# Patient Record
Sex: Male | Born: 1940
Health system: Southern US, Community
[De-identification: ages and names within clinical notes are randomized; demographics above are authoritative.]

## PROBLEM LIST (undated history)

## (undated) DIAGNOSIS — E049 Nontoxic goiter, unspecified: Secondary | ICD-10-CM

## (undated) DIAGNOSIS — I428 Other cardiomyopathies: Secondary | ICD-10-CM

## (undated) DIAGNOSIS — M1990 Unspecified osteoarthritis, unspecified site: Secondary | ICD-10-CM

## (undated) DIAGNOSIS — I5040 Unspecified combined systolic (congestive) and diastolic (congestive) heart failure: Secondary | ICD-10-CM

## (undated) DIAGNOSIS — I5042 Chronic combined systolic (congestive) and diastolic (congestive) heart failure: Secondary | ICD-10-CM

## (undated) DIAGNOSIS — N529 Male erectile dysfunction, unspecified: Secondary | ICD-10-CM

## (undated) DIAGNOSIS — C801 Malignant (primary) neoplasm, unspecified: Secondary | ICD-10-CM

## (undated) DIAGNOSIS — I340 Nonrheumatic mitral (valve) insufficiency: Secondary | ICD-10-CM

## (undated) DIAGNOSIS — I441 Atrioventricular block, second degree: Secondary | ICD-10-CM

## (undated) DIAGNOSIS — I10 Essential (primary) hypertension: Secondary | ICD-10-CM

## (undated) DIAGNOSIS — I4892 Unspecified atrial flutter: Principal | ICD-10-CM

## (undated) DIAGNOSIS — N183 Chronic kidney disease, stage 3 unspecified: Secondary | ICD-10-CM

## (undated) DIAGNOSIS — D649 Anemia, unspecified: Secondary | ICD-10-CM

## (undated) DIAGNOSIS — G510 Bell's palsy: Secondary | ICD-10-CM

## (undated) HISTORY — PX: OTHER SURGICAL HISTORY: SHX169

## (undated) HISTORY — DX: Male erectile dysfunction, unspecified: N52.9

## (undated) HISTORY — PX: CYST REMOVAL TRUNK: SHX6283

## (undated) HISTORY — DX: Chronic combined systolic (congestive) and diastolic (congestive) heart failure: I50.42

## (undated) HISTORY — DX: Other cardiomyopathies: I42.8

## (undated) HISTORY — DX: Atrioventricular block, second degree: I44.1

## (undated) HISTORY — DX: Chronic kidney disease, stage 3 unspecified: N18.30

## (undated) HISTORY — DX: Chronic kidney disease, stage 3 (moderate): N18.3

## (undated) HISTORY — DX: Bell's palsy: G51.0

---

## 2008-09-22 ENCOUNTER — Encounter: Admission: RE | Admit: 2008-09-22 | Discharge: 2008-09-22 | Payer: Self-pay | Admitting: Family Medicine

## 2011-06-26 DIAGNOSIS — G510 Bell's palsy: Secondary | ICD-10-CM

## 2011-06-26 HISTORY — DX: Bell's palsy: G51.0

## 2011-10-01 ENCOUNTER — Other Ambulatory Visit: Payer: Self-pay

## 2011-10-01 ENCOUNTER — Emergency Department (HOSPITAL_COMMUNITY)
Admission: EM | Admit: 2011-10-01 | Discharge: 2011-10-01 | Disposition: A | Discharge status: Home / Self Care | Payer: Medicare Other | Attending: Emergency Medicine | Admitting: Emergency Medicine

## 2011-10-01 ENCOUNTER — Emergency Department (HOSPITAL_COMMUNITY): Payer: Medicare Other

## 2011-10-01 ENCOUNTER — Encounter (HOSPITAL_COMMUNITY): Payer: Self-pay | Admitting: *Deleted

## 2011-10-01 DIAGNOSIS — H00039 Abscess of eyelid unspecified eye, unspecified eyelid: Secondary | ICD-10-CM | POA: Insufficient documentation

## 2011-10-01 DIAGNOSIS — I10 Essential (primary) hypertension: Secondary | ICD-10-CM | POA: Insufficient documentation

## 2011-10-01 DIAGNOSIS — I441 Atrioventricular block, second degree: Secondary | ICD-10-CM | POA: Insufficient documentation

## 2011-10-01 DIAGNOSIS — M47812 Spondylosis without myelopathy or radiculopathy, cervical region: Secondary | ICD-10-CM | POA: Insufficient documentation

## 2011-10-01 DIAGNOSIS — R51 Headache: Secondary | ICD-10-CM | POA: Insufficient documentation

## 2011-10-01 DIAGNOSIS — L03213 Periorbital cellulitis: Secondary | ICD-10-CM

## 2011-10-01 HISTORY — DX: Essential (primary) hypertension: I10

## 2011-10-01 LAB — BASIC METABOLIC PANEL
CO2: 26 mEq/L (ref 19–32)
Calcium: 9.4 mg/dL (ref 8.4–10.5)
Creatinine, Ser: 1.21 mg/dL (ref 0.50–1.35)
GFR calc non Af Amer: 60 mL/min — ABNORMAL LOW (ref >90)
Sodium: 136 mEq/L (ref 135–145)

## 2011-10-01 LAB — CBC
HCT: 35.9 % — ABNORMAL LOW (ref 39.0–52.0)
MCH: 31.8 pg (ref 26.0–34.0)
MCHC: 34.5 g/dL (ref 30.0–36.0)
MCV: 92.1 fL (ref 78.0–100.0)
Platelets: 244 10*3/uL (ref 150–400)
RDW: 13.9 % (ref 11.5–15.5)

## 2011-10-01 LAB — DIFFERENTIAL
Basophils Absolute: 0 10*3/uL (ref 0.0–0.1)
Eosinophils Absolute: 0 10*3/uL (ref 0.0–0.7)
Eosinophils Relative: 1 % (ref 0–5)
Monocytes Absolute: 0.6 10*3/uL (ref 0.1–1.0)

## 2011-10-01 MED ORDER — TETRACAINE HCL 0.5 % OP SOLN
2.0000 [drp] | Freq: Once | OPHTHALMIC | Status: AC
  Administered 2011-10-01: 2 [drp] via OPHTHALMIC
  Filled 2011-10-01: qty 2

## 2011-10-01 MED ORDER — FLUORESCEIN SODIUM 1 MG OP STRP
1.0000 | ORAL_STRIP | Freq: Once | OPHTHALMIC | Status: AC
  Administered 2011-10-01: 1 via OPHTHALMIC
  Filled 2011-10-01: qty 1

## 2011-10-01 MED ORDER — IOHEXOL 300 MG/ML  SOLN
80.0000 mL | Freq: Once | INTRAMUSCULAR | Status: AC | PRN
  Administered 2011-10-01: 80 mL via INTRAVENOUS

## 2011-10-01 MED ORDER — BACITRACIN-POLYMYXIN B 500-10000 UNIT/GM OP OINT: TOPICAL_OINTMENT | Freq: Two times a day (BID) | OPHTHALMIC | Status: AC

## 2011-10-01 MED ORDER — AMOXICILLIN-POT CLAVULANATE 875-125 MG PO TABS: 1.0000 | ORAL_TABLET | Freq: Two times a day (BID) | ORAL | Status: AC

## 2011-10-01 NOTE — ED Notes (Addendum)
Pt reports yesterday he started having headache to the top of his head. Pt states the top of my head feels sore, rates it a 2/10. Reports this am woke up and the left side of his face is swollen and left eye pain. Reports difficulty hearing. Stroke scale negative.

## 2011-10-01 NOTE — ED Provider Notes (Signed)
History     CSN: [id number]  Arrival date & time 10/01/11  4098   First MD Initiated Contact with Patient 10/01/11 [phone]      Chief Complaint  Patient presents with  . Headache  . Facial Swelling    (Consider location/radiation/quality/duration/timing/severity/associated sxs/prior treatment) HPI  [name] h/o HTN on lisinopril presents with facial swelling. The patient states that last night he noted minimal swelling to his left ear and left eye. Worse this morning. He complains of blurry vision in his left eye as well and redness. There is no eye pain. He denies fevers, chills. He states that for the past week or so he's had mild headache and scalp tenderness in the left scalp. He denies pain with extraocular movements. He denies fevers, chills. No history of similar. No recent change in medications. He denies new exposures. Denies ear pain. No recent illness.   Past Medical History  Diagnosis Date  . Hypertension   . CHF (congestive heart failure)     History reviewed. No pertinent past surgical history.  History reviewed. No pertinent family history.  History  Substance Use Topics  . Smoking status: Never Smoker   . Smokeless tobacco: Not on file  . Alcohol Use: No      Review of Systems  All other systems reviewed and are negative.  except as noted HPI   Allergies  Review of patient's allergies indicates no known allergies.  Home Medications   Current Outpatient Rx  Name Route Sig Dispense Refill  . FUROSEMIDE 40 MG PO TABS Oral Take 40 mg by mouth daily.    Marland Kitchen LISINOPRIL 10 MG PO TABS Oral Take 10 mg by mouth daily.    . AMOXICILLIN-POT CLAVULANATE 875-125 MG PO TABS Oral Take 1 tablet by mouth 2 (two) times daily. 14 tablet 0  . BACITRACIN-POLYMYXIN B 500-10000 UNIT/GM OP OINT Left Eye Place into the left eye every 12 (twelve) hours. apply to eye every 6  hours while awake 3.5 g 0    BP 159/71  Pulse 55  Temp(Src) 98 F (36.7 C) (Oral)  Resp 16  SpO2  100%  Physical Exam  Nursing note and vitals reviewed. Constitutional: He is oriented to person, place, and time. He appears well-developed and well-nourished. No distress.  HENT:  Head: Atraumatic.  Mouth/Throat: Oropharynx is clear and moist.       No temporal artery ttp Lt external auditory canal without significant swelliing No pain with manipulation  Eyes: EOM are normal. Pupils are equal, round, and reactive to light. Left eye exhibits discharge.       Lt conjunctival patchy injection No pain with EOM PERRL ?proptosis? Periorbital swelling No FB No uptake of fluor under UV light No photophobia  Visual Acuity - Bilateral Near: 20/40 ; R Near: 20/70 ; L Near: 20/30  Neck: Normal range of motion. Neck supple. No JVD present. No tracheal deviation present.       Lt cervical and submandibular LAD  Cardiovascular: Normal rate, regular rhythm, normal heart sounds and intact distal pulses.  Exam reveals no gallop and no friction rub.   No murmur heard. Pulmonary/Chest: Effort normal. No stridor. No respiratory distress. He has no wheezes. He has no rales.  Abdominal: Soft. Bowel sounds are normal. There is no tenderness. There is no rebound and no guarding.  Musculoskeletal: Normal range of motion. He exhibits no edema and no tenderness.  Neurological: He is alert and oriented to person, place, and time.  Skin: Skin  is warm and dry.  Psychiatric: He has a normal mood and affect.     ED Course  Procedures (including critical care time)  Labs Reviewed  CBC - Abnormal; Notable for the following:    WBC 3.5 (*)    RBC 3.90 (*)    Hemoglobin 12.4 (*)    HCT 35.9 (*)    All other components within normal limits  DIFFERENTIAL - Abnormal; Notable for the following:    Monocytes Relative 17 (*)    All other components within normal limits  BASIC METABOLIC PANEL - Abnormal; Notable for the following:    GFR calc non Af Amer 60 (*)    GFR calc Af Amer 69 (*)    All other  components within normal limits   Ct Maxillofacial W/cm  10/01/2011  *RADIOLOGY REPORT*  Clinical Data: New pain and swelling.  Question proptosis.  Left- sided facial swelling.  Ear ache.  Left-sided headaches.  CT MAXILLOFACIAL WITH CONTRAST  Technique:  Multidetector CT imaging of the maxillofacial structures was performed with intravenous contrast. Multiplanar CT image reconstructions were also generated.  Contrast: 80mL OMNIPAQUE IOHEXOL 300 MG/ML  SOLN  Comparison: None.  Findings: Bilateral exophthalmos is noted.  No significant to subcutaneous edema or periorbital inflammation is present.  There may be slight supraorbital soft tissue swelling on the left.  The globes are intact.  The orbits are otherwise unremarkable.  Minimal mucosal thickening is present in the right maxillary sinus. The paranasal sinuses and mastoid air cells are otherwise clear. The osseous skull is intact.  The patient is edentulous.  Degenerative changes are noted in the upper cervical spine.  Limited imaging of the brain is unremarkable.  IMPRESSION:  1.  Mild exophthalmos.  This is likely chronic. 2.  Minimal if any periorbital soft tissue swelling on the left. No post-septal changes are evident. 3.  Mild spondylosis in the upper cervical spine.  Original Report Authenticated By: Jamesetta Orleans. MATTERN, M.D.     1. Periorbital cellulitis   2. Wenckebach second degree AV block     MDM  Left eye swelling, left ear canal subjective swelling. He does have left submandibular and cervical lymphadenopathy. He appears to have some proptosis of the left eye. CT scan with bilateral exophthalmos which is likely chronic. It likely appears more prominent on the left secondary to the periorbital swelling.  He did have minimal discharge from the left eye. There is no apparent dacryocystitis. Differential diagnosis includes periorbital cellulitis, seasonal allergies, less likely glaucoma. Patient advised to take over the counter  zyrtec/benadryl as needed per OTC instructions. Augmentin and abx eye gtt. F/U PMD for BP recheck, and will f/u with optometry as needed.        Forbes Cellar, MD 10/01/11 1209

## 2011-10-01 NOTE — ED Notes (Signed)
Pt states that they had driven back from [place]  Yesterday had been to a funeral. A for week. States that before they went pt had h/a on left side of head  Yesterday  Left side of face, eye,ear  and neck started to swell, has an ear ache now denies n/v/sob states has no upper teeth for them to be infected does not waer dentures

## 2011-10-01 NOTE — Discharge Instructions (Signed)
Periorbital Cellulitis Periorbital cellulitis is a common infection that can affect the eyelid and the soft tissues that surround the eyeball. The infection may also affect the structures that produce and drain tears. It does not affect the eyeball itself. Natural tissue barriers usually prevent the spread of this infection to the eyeball and other deeper areas of the eye socket.  CAUSES  Bacterial infection.   Long-term (chronic) sinus infections.   An object (foreign body) stuck behind the eye.   An injury that goes through the eyelid tissues.   An injury that causes an infection, such as an insect sting.   Fracture of the bone around the eye.   Infections which have spread from the eyelid or other structures around the eye.   Bite wounds.   Inflammation or infection of the lining membranes of the brain (meningitis).   An infection in the blood (septicemia).   Dental infection (abscess).   Viral infection (this is rare).  SYMPTOMS Symptoms usually come on suddenly.  Pain in the eye.   Red, hot, and swollen eyelids and possibly cheeks. The swelling is sometimes bad enough that the eyelids cannot open. Some infections make the eyelids look purple.   Fever and feeling generally ill.   Pain when touching the area around the eye.  DIAGNOSIS  Periorbital cellulitis can be diagnosed from an eye exam. In severe cases, your caregiver might suggest:  Blood tests.   Imaging tests (such as a CT scan) to examine the sinuses and the area around and behind the eyeball.  TREATMENT If your caregiver feels that you do not have any signs of serious infection, treatment may include:  Antibiotics.   Nasal decongestants to reduce swelling.   Referral to a dentist if it is suspected that the infection was caused by a prior tooth infection.   Examination every day to make sure the problem is improving.  HOME CARE INSTRUCTIONS  Take your antibiotics as directed. Finish them even  if you start to feel better.   Some pain is normal with this condition. Take pain medicine as directed by your caregiver. Only take pain medicines approved by your caregiver.   It is important to drink fluids. Drink enough water and fluids to keep your urine clear or pale yellow.   Do not smoke.   Rest and get plenty of sleep.   Mild or moderate fevers generally have no long-term effects and often do not require treatment.   If your caregiver has given you a follow-up appointment, it is very important to keep that appointment. Your caregiver will need to make sure that the infection is getting better. It is important to check that a more serious infection is not developing.  SEEK IMMEDIATE MEDICAL CARE IF:  Your eyelids become more painful, red, warm, or swollen.   You develop double vision or your vision becomes blurred or worsens in any way.   You have trouble moving your eyes.   The eye looks like it is popping out (proptosis).   You develop a severe headache, severe neck pain, or neck stiffness.   You develop repeated vomiting.   You have a fever or persistent symptoms for more than 72 hours.   You have a fever and your symptoms suddenly get worse.  MAKE SURE YOU:  Understand these instructions.   Will watch your condition.   Will get help right away if you are not doing well or get worse.  Document Released: 07/14/2010 Document Revised: 05/31/2011   Document Reviewed: 07/14/2010 [facility] Patient Information 2012 [place], [place].  Second Degree Atrioventricular Block Second degree atrioventricular block is a type of heart block. The heartbeat is a coordinated contraction between the upper and lower chambers of the heart. This coordinated contraction happens because of an electrical impulse that is sent from the upper chambers of the heart to the lower chambers of the heart. The electrical impulse causes the heart to beat and pump blood. Normally, this electrical impulse is  transmitted without delay. In a second degree heart block, an interruption occurs in the heart's electrical impulse between the upper and lower chambers of the heart. When this happens, the heart does not beat in a timely manner, which affects the amount of blood pumped by the heart. There are two types of 2nd degree heart block:   Mobitz Type 1. In this type of 2nd degree heart block, the electrical impulse is gradually delayed more and more until the heart misses a beat. This type of 2nd degree heart block is less serious than Mobitz type 2.   Mobitz Type 2. This type of 2nd degree heart block is more serious and can become a more severe form of heart block. With Mobitz type 2, some of the electrical signals are blocked and do not reach the lower chambers of the heart. This can occur suddenly and without warning. Some people may need a permanent pacemaker with this type of heart block.  CAUSES  Second degree heart block may be a result of:  Age. The heart's electrical system can degenerate due to the aging process.   Heart attack. A heart attack can cause scarring which can damage the heart's electrical system.   Open heart surgery can damage and scar areas of the heart which affect the heart' s electrical system.   Heart medications such as beta and calcium channel blockers. These kinds of medications can affect the electrical impulse of the heart and can slow the heart rate if the dosage is too high.  SYMPTOMS   Mobitz type 1 - Usually, no symptoms are noticed, but a person may have the same symptoms listed under Mobitz type 2.   Mobitz type 2 - Compared to Mobitz type 1, there is a greater likelihood of experiencing the following symptoms:   Fatigue.   Shortness of breath.   Dizziness or lightheadedness.   Fainting.   Chest pain.  DIAGNOSIS   Electrocardiogram (EKG). An EKG is a tracing of the heartbeat and can show a 2nd degree heart block.   Holter monitor. A holter monitor  is a continuous heart rhythm recording for 24 hours. This can be helpful in determining the kind of heart block you have and how it can be treated.   Electrophysiology (EP) study. This is a procedure that tests the electrical pathway of your heart. This type of test is done by a specialist who places long thin tubes (catheters) in your heart. The catheters are used to study your heart and record your heart's electrical signals.  TREATMENT   Mobitz Type 1. Generally, no treatment is needed.   Mobitz Type 2. A permanent pacemaker may be needed.   Heart medications such as beta blockers or calcium channel blockers can slow the heart rate. Your caregiver may need to adjust your heart medication if this is the cause of your heart block. Adjusting your heart medication may reverse the heart block.  SEEK MEDICAL CARE IF:   You have unexplained fatigue.   You feel  lightheaded.   You feel faint.   You feel your heart skipping beats or your heart beats very fast.  SEEK IMMEDIATE MEDICAL CARE IF:   You have severe chest pain, especially if the pain is crushing or pressure-like and spreads to the arms, back, neck, or jaw. This is an emergency. Do not wait to see if the pain will go away. Get medical help at once. Call your local emergency services (911 in the [place] Do not drive yourself to the hospital.   You notice increasing shortness of breath during rest, sleeping, or with activity.   You "black out" or faint.  MAKE SURE YOU:   Understand these instructions.   Will watch your condition.   Will get help right away if you are not doing well or get worse.  Document Released: 05/24/2008 Document Revised: 05/31/2011 Document Reviewed: 05/24/2008 [facility] Patient Information 2012 [place], [place].  RESOURCE GUIDE  Dental Problems  Patients with Medicaid: [facility] [phone] [address]                                           [phone] [address]  [employer] Phone:  [phone]                                                   Phone:  [phone]  If unable to pay or uninsured, contact:  [facility] or [facility]. to become qualified for the adult dental clinic.  Chronic Pain Problems Contact [facility]  [phone] Patients need to be referred by their primary care doctor.  Insufficient Money for Medicine Contact [employer] Way:  call "211" or [facility] (918)068-9113.  No Primary Care Doctor Call [facility]  9410774837 Other agencies that provide inexpensive medical care    [facility]  [phone]    [facility]  [phone]    [facility]  [phone]    [facility]  [phone]    [facility]  [phone]    [facility]  [phone]  Psychological Services [facility]  [phone] [facility]  [phone] [facility]   [phone] (emergency services [phone]) [phone])  Abuse/Neglect [facility] Child Abuse Hotline 724 209 1420 [facility] Child Abuse Hotline 607-761-4809 (After Hours)  Emergency Shelter [facility] (531) 830-1405  [facility] [place] [facility] (978)651-2495 [facility] [phone]  MRSA Hotline #:   (619) 388-0777    [facility] Resources  [facility]  [employer]                           [facility] [address] [place]                     [address]         [address]  [address]  Cristobal Goldmann Phone:  [phone]                                  Phone:  [phone]                   Phone:  [phone]  [facility] Phone:  [phone]  [facility] Child Abuse Hotline 712 794 5037 [phone] (After Hours)

## 2011-10-30 ENCOUNTER — Emergency Department (HOSPITAL_COMMUNITY)
Admission: EM | Admit: 2011-10-30 | Discharge: 2011-10-31 | Disposition: A | Discharge status: Home / Self Care | Payer: Medicare Other | Attending: Emergency Medicine | Admitting: Emergency Medicine

## 2011-10-30 ENCOUNTER — Emergency Department (HOSPITAL_COMMUNITY): Payer: Medicare Other

## 2011-10-30 ENCOUNTER — Encounter (HOSPITAL_COMMUNITY): Payer: Self-pay | Admitting: Emergency Medicine

## 2011-10-30 DIAGNOSIS — I10 Essential (primary) hypertension: Secondary | ICD-10-CM | POA: Insufficient documentation

## 2011-10-30 DIAGNOSIS — R569 Unspecified convulsions: Secondary | ICD-10-CM

## 2011-10-30 DIAGNOSIS — I509 Heart failure, unspecified: Secondary | ICD-10-CM | POA: Insufficient documentation

## 2011-10-30 DIAGNOSIS — G319 Degenerative disease of nervous system, unspecified: Secondary | ICD-10-CM | POA: Insufficient documentation

## 2011-10-30 LAB — DIFFERENTIAL
Basophils Absolute: 0 10*3/uL (ref 0.0–0.1)
Eosinophils Absolute: 0.1 10*3/uL (ref 0.0–0.7)
Eosinophils Relative: 1 % (ref 0–5)
Lymphocytes Relative: 33 % (ref 12–46)
Lymphs Abs: 1.3 10*3/uL (ref 0.7–4.0)
Neutrophils Relative %: 56 % (ref 43–77)

## 2011-10-30 LAB — GLUCOSE, CAPILLARY: Glucose-Capillary: 77 mg/dL (ref 70–99)

## 2011-10-30 LAB — CBC
MCH: 31.5 pg (ref 26.0–34.0)
MCV: 90.9 fL (ref 78.0–100.0)
Platelets: 228 10*3/uL (ref 150–400)
RBC: 3.84 MIL/uL — ABNORMAL LOW (ref 4.22–5.81)
RDW: 13.7 % (ref 11.5–15.5)
WBC: 3.8 10*3/uL — ABNORMAL LOW (ref 4.0–10.5)

## 2011-10-30 LAB — URINALYSIS, ROUTINE W REFLEX MICROSCOPIC
Bilirubin Urine: NEGATIVE
Ketones, ur: NEGATIVE mg/dL
Protein, ur: NEGATIVE mg/dL
Urobilinogen, UA: 2 mg/dL — ABNORMAL HIGH (ref 0.0–1.0)
pH: 6.5 (ref 5.0–8.0)

## 2011-10-30 LAB — COMPREHENSIVE METABOLIC PANEL
ALT: 15 U/L (ref 0–53)
AST: 20 U/L (ref 0–37)
Alkaline Phosphatase: 84 U/L (ref 39–117)
Calcium: 9.1 mg/dL (ref 8.4–10.5)
Potassium: 3.6 mEq/L (ref 3.5–5.1)
Sodium: 136 mEq/L (ref 135–145)
Total Protein: 7.3 g/dL (ref 6.0–8.3)

## 2011-10-30 LAB — ETHANOL: Alcohol, Ethyl (B): <11 mg/dL (ref 0–11)

## 2011-10-30 LAB — RAPID URINE DRUG SCREEN, HOSP PERFORMED
Amphetamines: NOT DETECTED
Barbiturates: NOT DETECTED
Benzodiazepines: NOT DETECTED
Tetrahydrocannabinol: NOT DETECTED

## 2011-10-30 LAB — URINE MICROSCOPIC-ADD ON

## 2011-10-30 MED ORDER — LEVETIRACETAM ER 500 MG PO TB24: 500.0000 mg | ORAL_TABLET | Freq: Two times a day (BID) | ORAL | Status: DC

## 2011-10-30 MED ORDER — SODIUM CHLORIDE 0.9 % IV SOLN
1000.0000 mg | Freq: Once | INTRAVENOUS | Status: AC
  Administered 2011-10-30: 1000 mg via INTRAVENOUS
  Filled 2011-10-30 (×2): qty 10

## 2011-10-30 NOTE — ED Notes (Signed)
Per EMS- Pt recently had a diagnosis of Bell's Palsy  About a month ago. Pt was found in room actively having a seizure by family members. Pt post-ictal during route,  Confused and disoriented. Hypertensive BP 208/110.

## 2011-10-30 NOTE — Discharge Instructions (Signed)
Driving and Equipment Restrictions Some medical problems make it dangerous to drive, ride a bike, or use machines. Some of these problems are:  A hard blow to the head (concussion).   Passing out (fainting).   Twitching and shaking (seizures).   Low blood sugar.   Taking medicine to help you relax (sedatives).   Taking pain medicines.   Wearing an eye patch.   Wearing splints. This can make it hard to use parts of your body that you need to drive safely.  HOME CARE   Do not drive until your doctor says it is okay.   Do not use machines until your doctor says it is okay.  You may need a form signed by your doctor (medical release) before you can drive again. You may also need this form before you do other tasks where you need to be fully alert. MAKE SURE YOU:  Understand these instructions.   Will watch your condition.   Will get help right away if you are not doing well or get worse.  Document Released: 07/19/2004 Document Revised: 05/31/2011 Document Reviewed: 10/19/2009 [employer] Patient Information 2012 [employer].Epilepsy People with epilepsy have times when they shake and jerk uncontrollably (seizures). This happens when there is a sudden change in brain function. Epilepsy may have many possible causes. Anything that disturbs the normal pattern of brain cell activity can lead to seizures. HOME CARE   Listen to your doctor about driving and safety during normal activities.   Only take medicine as told by your doctor.   Take blood tests as told by your doctor.   Tell the people you live and work with that you have seizures. Make sure they know how to help you. They should:   Cushion your head and body.   Turn you on your side.   Not restrain you.   Not place anything inside your mouth.   Call for local emergency medical help if there is any question about what has happened.   Write down when your seizures happen and what you remember about each seizure.  Write down anything you think may have caused the seizure to happen (trigger).   Keep all follow-up visits with your doctor. This is very important.  GET HELP RIGHT AWAY IF:   You get an infection or start to feel sick. You may have more seizures when you are sick.   You are having seizures more often.   Your seizure pattern is changing.   A seizure does not stop after a few seconds or minutes.   A seizure causes you to have trouble breathing.   A seizure gives you a very bad headache.   A seizure makes you unable to speak or use a part of your body.  MAKE SURE YOU:   Understand these instructions.   Will watch your condition.   Will get help right away if you are not doing well or get worse.  Document Released: 04/08/2009 Document Revised: 05/31/2011 Document Reviewed: 04/08/2009 [employer] Patient Information 2012 [employer]. 

## 2011-10-30 NOTE — ED Provider Notes (Signed)
History     CSN: [id number]  Arrival date & time 10/30/11  0865   First MD Initiated Contact with Patient 10/30/11 2015      Chief Complaint  Patient presents with  . Seizures    (Consider location/radiation/quality/duration/timing/severity/associated sxs/prior treatment) HPI Comments: Patient with a history of hypertension and CHF presents emergency Department with a chief complaint of new onset seizure.  Onset of seizure was unknown however patient was found seizing and pattern of progression with intermittent tonic-clonic seizing lasting 10-15 minutes.  I position was rolled back, there was no incontinence, and patient did have a loss of consciousness.  Patient is unaware of any preceding symptoms including nausea, vomiting, smells or tastes, or sleep deprivation. Family history is positive for endocrine abnormalities, patient denies any recent travel, fevers, night sweats, chills, cough, n/v, or abdominal pain. Pt is currently confused with altered mental status. History obtained from family members and partially from pt.   Patient is a 71 y.o. male presenting with seizures. The history is provided by the patient.  Seizures     Past Medical History  Diagnosis Date  . Hypertension   . CHF (congestive heart failure)     History reviewed. No pertinent past surgical history.  No family history on file.  History  Substance Use Topics  . Smoking status: Never Smoker   . Smokeless tobacco: Not on file  . Alcohol Use: No      Review of Systems  Neurological: Positive for seizures.  All other systems reviewed and are negative.    Allergies  Review of patient's allergies indicates no known allergies.  Home Medications   Current Outpatient Rx  Name Route Sig Dispense Refill  . FUROSEMIDE 40 MG PO TABS Oral Take 40 mg by mouth daily.    Marland Kitchen LISINOPRIL 10 MG PO TABS Oral Take 10 mg by mouth daily.    . MELOXICAM 7.5 MG PO TABS Oral Take 7.5 mg by mouth daily.    .  OMEGA-3-ACID ETHYL ESTERS 1 G PO CAPS Oral Take 2 g by mouth 2 (two) times daily.    Frazier Butt OP Both Eyes Place 1 drop into both eyes daily.      BP 193/80  Pulse 67  Temp(Src) 98.1 F (36.7 C) (Oral)  Resp 18  SpO2 100%  Physical Exam  Nursing note and vitals reviewed. Constitutional: He appears well-developed and well-nourished. No distress.       Post ictal, mildly altered, Hypertensive   HENT:  Head: Normocephalic.       Atraumatic, No evidence of tongue oral lacerations  Eyes: EOM are normal. Pupils are equal, round, and reactive to light.       bilateral exophthalmos, left >right  Neck: Normal range of motion. Neck supple.       Cervical spinous process non tender without step offs, no difficulty or pain with flexion or extension of neck  Cardiovascular: Normal rate, regular rhythm, normal heart sounds and intact distal pulses.   Pulmonary/Chest: Breath sounds normal. No respiratory distress. He has no wheezes. He has no rales.  Abdominal: Soft. There is no tenderness.  Musculoskeletal: He exhibits no edema and no tenderness.       Full active & passive ROM of arms bilaterally  Neurological:       CN III-XII tested and intact with the exception of V, VII (L sd facial droop & decreased sensation. Intact coordination, sensation, and motor (finger grip, biceps, hamstrings & dorsiflexion). Left side pass  pointing. Slow rapid coordination. Gait normal.   Skin: Skin is warm and dry. He is not diaphoretic.       intact    ED Course  Procedures (including critical care time)  Labs Reviewed  CBC - Abnormal; Notable for the following:    WBC 3.8 (*)    RBC 3.84 (*)    Hemoglobin 12.1 (*)    HCT 34.9 (*)    All other components within normal limits  COMPREHENSIVE METABOLIC PANEL - Abnormal; Notable for the following:    Albumin 3.2 (*)    GFR calc non Af Amer 52 (*)    GFR calc Af Amer 61 (*)    All other components within normal limits  URINALYSIS, ROUTINE W REFLEX  MICROSCOPIC - Abnormal; Notable for the following:    Hgb urine dipstick SMALL (*)    Urobilinogen, UA 2.0 (*)    All other components within normal limits  DIFFERENTIAL  URINE RAPID DRUG SCREEN (HOSP PERFORMED)  ETHANOL  GLUCOSE, CAPILLARY  URINE MICROSCOPIC-ADD ON   Ct Head Wo Contrast  10/30/2011  *RADIOLOGY REPORT*  Clinical Data: New onset seizure.  Hypertensive.  CT HEAD WITHOUT CONTRAST  Technique:  Contiguous axial images were obtained from the base of the skull through the vertex without contrast.  Comparison: None.  Findings: There is no evidence for acute infarction, intracranial hemorrhage, mass lesion, hydrocephalus, or extra-axial fluid.  Mild atrophy is present.  There is mild chronic microvascular ischemic change.  The calvarium is intact.  Clear sinuses and mastoids. Mild vascular calcification.  IMPRESSION: No acute intracranial findings.  Mild atrophy and chronic microvascular ischemic change.  No intracranial mass lesion to suggest an ictal focus.  Original Report Authenticated By: Elsie Stain, M.D.     No diagnosis found.    MDM  Hypertension, New onset seizure Consult: neurology Dr Roseanne Reno. Discussed need for MRI vs f-u as an OP on medication. Per his recommendation the pt will be given 1000 Keppra here and 500 BID  Pt refused keppra IV and states he just wants 500 IV instead of 1000. He verbalizes understanding of risk to seize if he does not take the recommended dose. Spoke in depth with family and pt on importance of following up with neurology for further work up. Pt case discussed with Dr. Jeraldine Loots who has seen patient and agrees with my plan to dc pt.          Jaci Carrel, [place] 10/30/11 2258

## 2011-10-31 NOTE — ED Provider Notes (Signed)
Medical screening examination/treatment/procedure(s) were conducted as a shared visit with non-physician practitioner(s) and myself.  I personally evaluated the patient during the encounter On my exam this elderly gentleman was resting comfortably.  Notably, the patient has a history of recently diagnosed Bell's palsy, but is not currently taking any steroids according to him or his family members.  Patient has no prior seizure events.  We discussed the patient's case with our neurology colleagues.  Patient was loaded with Keppra and will follow up with them as directed.  Gerhard Munch, MD 10/31/11 [phone]

## 2011-11-01 ENCOUNTER — Other Ambulatory Visit (HOSPITAL_COMMUNITY): Payer: Self-pay | Admitting: Family Medicine

## 2011-11-01 DIAGNOSIS — R569 Unspecified convulsions: Secondary | ICD-10-CM

## 2011-11-01 DIAGNOSIS — R29810 Facial weakness: Secondary | ICD-10-CM

## 2011-11-06 ENCOUNTER — Ambulatory Visit (HOSPITAL_COMMUNITY)
Admission: RE | Admit: 2011-11-06 | Discharge: 2011-11-06 | Disposition: A | Discharge status: Home / Self Care | Payer: Medicare Other | Source: Ambulatory Visit | Attending: Family Medicine | Admitting: Family Medicine

## 2011-11-06 DIAGNOSIS — R29810 Facial weakness: Secondary | ICD-10-CM | POA: Insufficient documentation

## 2011-11-06 DIAGNOSIS — R569 Unspecified convulsions: Secondary | ICD-10-CM | POA: Insufficient documentation

## 2011-11-06 MED ORDER — GADOBENATE DIMEGLUMINE 529 MG/ML IV SOLN
17.0000 mL | Freq: Once | INTRAVENOUS | Status: AC
  Administered 2011-11-06: 17 mL via INTRAVENOUS

## 2012-06-15 ENCOUNTER — Encounter (HOSPITAL_COMMUNITY): Payer: Self-pay | Admitting: Cardiology

## 2012-06-15 ENCOUNTER — Emergency Department (HOSPITAL_COMMUNITY)
Admission: EM | Admit: 2012-06-15 | Discharge: 2012-06-15 | Disposition: A | Discharge status: Home / Self Care | Payer: 59 | Attending: Emergency Medicine | Admitting: Emergency Medicine

## 2012-06-15 DIAGNOSIS — Y9389 Activity, other specified: Secondary | ICD-10-CM | POA: Insufficient documentation

## 2012-06-15 DIAGNOSIS — T148XXA Other injury of unspecified body region, initial encounter: Secondary | ICD-10-CM

## 2012-06-15 DIAGNOSIS — X503XXA Overexertion from repetitive movements, initial encounter: Secondary | ICD-10-CM | POA: Insufficient documentation

## 2012-06-15 DIAGNOSIS — M549 Dorsalgia, unspecified: Secondary | ICD-10-CM | POA: Insufficient documentation

## 2012-06-15 DIAGNOSIS — Z79899 Other long term (current) drug therapy: Secondary | ICD-10-CM | POA: Insufficient documentation

## 2012-06-15 DIAGNOSIS — R109 Unspecified abdominal pain: Secondary | ICD-10-CM | POA: Insufficient documentation

## 2012-06-15 DIAGNOSIS — I509 Heart failure, unspecified: Secondary | ICD-10-CM | POA: Insufficient documentation

## 2012-06-15 DIAGNOSIS — IMO0002 Reserved for concepts with insufficient information to code with codable children: Secondary | ICD-10-CM | POA: Insufficient documentation

## 2012-06-15 DIAGNOSIS — I10 Essential (primary) hypertension: Secondary | ICD-10-CM | POA: Insufficient documentation

## 2012-06-15 DIAGNOSIS — R1110 Vomiting, unspecified: Secondary | ICD-10-CM | POA: Insufficient documentation

## 2012-06-15 DIAGNOSIS — Y929 Unspecified place or not applicable: Secondary | ICD-10-CM | POA: Insufficient documentation

## 2012-06-15 MED ORDER — OXYCODONE-ACETAMINOPHEN 5-325 MG PO TABS
2.0000 | ORAL_TABLET | Freq: Once | ORAL | Status: AC
  Administered 2012-06-15: 2 via ORAL
  Filled 2012-06-15: qty 2

## 2012-06-15 MED ORDER — KETOROLAC TROMETHAMINE 30 MG/ML IJ SOLN
30.0000 mg | Freq: Once | INTRAMUSCULAR | Status: AC
  Administered 2012-06-15: 30 mg via INTRAMUSCULAR
  Filled 2012-06-15: qty 1

## 2012-06-15 MED ORDER — IBUPROFEN 400 MG PO TABS
400.0000 mg | ORAL_TABLET | Freq: Once | ORAL | Status: AC
  Administered 2012-06-15: 400 mg via ORAL
  Filled 2012-06-15: qty 1

## 2012-06-15 MED ORDER — DIAZEPAM 5 MG PO TABS: 5.0000 mg | ORAL_TABLET | Freq: Three times a day (TID) | ORAL | Status: DC | PRN

## 2012-06-15 MED ORDER — OXYCODONE-ACETAMINOPHEN 5-325 MG PO TABS: 1.0000 | ORAL_TABLET | Freq: Four times a day (QID) | ORAL | Status: DC | PRN

## 2012-06-15 MED ORDER — IBUPROFEN 400 MG PO TABS: 400.0000 mg | ORAL_TABLET | Freq: Three times a day (TID) | ORAL | Status: DC | PRN

## 2012-06-15 MED ORDER — DIAZEPAM 5 MG PO TABS
5.0000 mg | ORAL_TABLET | Freq: Once | ORAL | Status: AC
  Administered 2012-06-15: 5 mg via ORAL
  Filled 2012-06-15: qty 1

## 2012-06-15 NOTE — ED Provider Notes (Signed)
History     CSN: [id number]  Arrival date & time 06/15/12  1904   First MD Initiated Contact with Patient 06/15/12 2031      Chief Complaint  Patient presents with  . 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  He has to help lift a 68+ male, along with her wheelchair into his transport van pusher into position.  The lock the wheelchair and then push your off the wheelchair van.  After that, time.  He noted pain in his left leg.  Radiating to his left groin.  That has been progressive and worsening.  Over the past is been taking over-the-counter ibuprofen.  Using ice, and heat, and muscle rub, without relief.  He denies any dysuria, frequency, hematuria, change in bowel pattern, nausea, vomiting, diarrhea.  Patient is a 71 y.o. male presenting with hip pain. The history is provided by the patient.  Hip Pain This is a new problem. The current episode started in the past 7 days. The problem occurs constantly. The problem has been gradually worsening. Associated symptoms include vomiting. Pertinent negatives include no chills, fever, joint swelling, nausea, numbness, rash or weakness.    Past Medical History  Diagnosis Date  . Hypertension   . CHF (congestive heart failure)     History reviewed. No pertinent past surgical history.  History reviewed. No pertinent family history.  History  Substance Use Topics  . Smoking status: Never Smoker   . Smokeless tobacco: Not on file  . Alcohol Use: No      Review of Systems  Constitutional: Negative for fever and chills.  HENT: Negative.   Respiratory: Negative.   Cardiovascular: Negative.   Gastrointestinal: Positive for vomiting. Negative for nausea, diarrhea, constipation and blood in stool.  Genitourinary: Positive for flank pain. Negative for dysuria, frequency and  decreased urine volume.  Musculoskeletal: Positive for back pain. Negative for joint swelling.  Skin: Negative for rash and wound.  Neurological: Negative for weakness and numbness.    Allergies  Review of patient's allergies indicates no known allergies.  Home Medications   Current Outpatient Rx  Name  Route  Sig  Dispense  Refill  . ACETAMINOPHEN ER 650 MG PO TBCR   Oral   Take 1,300 mg by mouth 2 (two) times daily as needed. For pain         . FUROSEMIDE 40 MG PO TABS   Oral   Take 40 mg by mouth daily.         . IBUPROFEN 600 MG PO TABS   Oral   Take 600 mg by mouth 2 (two) times daily as needed. For pain         . LISINOPRIL 10 MG PO TABS   Oral   Take 10 mg by mouth daily.         . MELOXICAM 7.5 MG PO TABS   Oral   Take 7.5 mg by mouth daily as needed. For pain         . OMEGA 3 PO   Oral   Take 2 capsules by mouth daily.         Marland Kitchen DIAZEPAM 5 MG PO TABS   Oral   Take 1 tablet (5 mg total) by mouth every 8 (eight) hours as needed for anxiety.   30 tablet   0   . IBUPROFEN 400 MG PO TABS  Oral   Take 1 tablet (400 mg total) by mouth every 8 (eight) hours as needed for pain.   30 tablet   0   . OXYCODONE-ACETAMINOPHEN 5-325 MG PO TABS   Oral   Take 1 tablet by mouth every 6 (six) hours as needed for pain (Severe pain).   20 tablet   0     BP 173/97  Pulse 75  Temp 98.8 F (37.1 C) (Oral)  Resp 17  SpO2 100%  Physical Exam  Constitutional: He is oriented to person, place, and time. He appears well-developed and well-nourished.  HENT:  Head: Normocephalic.  Eyes: Pupils are equal, round, and reactive to light.  Neck: Normal range of motion.  Cardiovascular: Normal rate.   Pulmonary/Chest: Effort normal.  Abdominal: Soft.  Musculoskeletal: Normal range of motion.  Neurological: He is alert and oriented to person, place, and time.  Skin: Skin is warm and dry.    ED Course  Procedures (including critical care time)  Labs  Reviewed - No data to display No results found.   1. Muscle strain       MDM   This appears to be muscular in nature, I will treat with a muscle relaxer, pain control, and anti-inflammatory After being medicated in the emergency department.  Patient states he is getting some relief of his discomfort        Arman Filter, NP 06/15/12 2209

## 2012-06-15 NOTE — ED Notes (Signed)
NP at bedside.

## 2012-06-15 NOTE — ED Notes (Signed)
Spouse at bedside

## 2012-06-15 NOTE — ED Notes (Signed)
Pt states pain is worse standing, shoots from back of left hip to front of left lower abdomen. Feels like twisting, squeezing pain. A&Ox4, ambulatory, stable.

## 2012-06-15 NOTE — ED Notes (Signed)
Pt reports left hip, back and abd pain that started a couple of days ago.  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 States increased pain with movement. Denies any urinary/bowel symptoms.

## 2012-06-18 NOTE — ED Provider Notes (Signed)
Medical screening examination/treatment/procedure(s) were performed by non-physician practitioner and as supervising physician I was immediately available for consultation/collaboration.  Raeford Razor, MD 06/18/12 [phone]

## 2013-03-22 ENCOUNTER — Encounter: Payer: Self-pay | Admitting: *Deleted

## 2013-03-22 ENCOUNTER — Encounter: Payer: Self-pay | Admitting: Cardiology

## 2013-03-22 DIAGNOSIS — I10 Essential (primary) hypertension: Secondary | ICD-10-CM | POA: Insufficient documentation

## 2013-03-22 DIAGNOSIS — N529 Male erectile dysfunction, unspecified: Secondary | ICD-10-CM | POA: Insufficient documentation

## 2013-03-22 DIAGNOSIS — R569 Unspecified convulsions: Secondary | ICD-10-CM | POA: Insufficient documentation

## 2013-03-22 DIAGNOSIS — G510 Bell's palsy: Secondary | ICD-10-CM | POA: Insufficient documentation

## 2013-03-22 DIAGNOSIS — I4819 Other persistent atrial fibrillation: Secondary | ICD-10-CM | POA: Insufficient documentation

## 2013-03-26 ENCOUNTER — Encounter: Payer: Self-pay | Admitting: Cardiology

## 2013-03-30 ENCOUNTER — Ambulatory Visit: Payer: 59 | Admitting: Cardiology

## 2013-06-12 ENCOUNTER — Encounter (HOSPITAL_COMMUNITY): Payer: Self-pay | Admitting: Emergency Medicine

## 2013-06-12 ENCOUNTER — Emergency Department (HOSPITAL_COMMUNITY)
Admission: EM | Admit: 2013-06-12 | Discharge: 2013-06-12 | Disposition: A | Discharge status: Home / Self Care | Payer: 59 | Attending: Emergency Medicine | Admitting: Emergency Medicine

## 2013-06-12 DIAGNOSIS — Z87448 Personal history of other diseases of urinary system: Secondary | ICD-10-CM | POA: Insufficient documentation

## 2013-06-12 DIAGNOSIS — Z7982 Long term (current) use of aspirin: Secondary | ICD-10-CM | POA: Insufficient documentation

## 2013-06-12 DIAGNOSIS — I10 Essential (primary) hypertension: Secondary | ICD-10-CM | POA: Insufficient documentation

## 2013-06-12 DIAGNOSIS — Z79899 Other long term (current) drug therapy: Secondary | ICD-10-CM | POA: Insufficient documentation

## 2013-06-12 DIAGNOSIS — R040 Epistaxis: Secondary | ICD-10-CM | POA: Insufficient documentation

## 2013-06-12 DIAGNOSIS — I5030 Unspecified diastolic (congestive) heart failure: Secondary | ICD-10-CM | POA: Insufficient documentation

## 2013-06-12 DIAGNOSIS — Z8669 Personal history of other diseases of the nervous system and sense organs: Secondary | ICD-10-CM | POA: Insufficient documentation

## 2013-06-12 NOTE — ED Notes (Signed)
Nose bleed started at 0630 this am has had clots running down throat has had cold is on NO blood thinnners states that he has had some small bleeding all week

## 2013-06-12 NOTE — ED Provider Notes (Addendum)
CSN: [id number]     Arrival date & time 06/12/13  0744 History   First MD Initiated Contact with Patient 06/12/13 0745     No chief complaint on file.  (Consider location/radiation/quality/duration/timing/severity/associated sxs/prior Treatment) Patient is a 72 y.o. male presenting with nosebleeds. The history is provided by the patient.  Epistaxis Location:  L nare Severity:  Severe Duration:  1 hour Timing:  Constant Progression:  Unchanged Chronicity:  Recurrent Context: hypertension   Context: not anticoagulants, not bleeding disorder, not CPAP, not drug use, not home oxygen and not thrombocytopenia   Context comment:  Recent URI over the last week  Relieved by:  Nothing Worsened by:  Sneezing Ineffective treatments:  Applying pressure Associated symptoms: blood in oropharynx   Associated symptoms: no dizziness   Risk factors: frequent nosebleeds   Risk factors: no alcohol use, no allergies, no change in medication, no intranasal steroids and no recent nasal surgery     Past Medical History  Diagnosis Date  . Hypertension   . 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 Bell palsy   . Erectile dysfunction   . Atrial fibrillation   . Seizure     history of seizure 10/2011 treated with Keppra (MRI of brain revealed no acute abnormality 11/06/11)  . Edema extremities    No past surgical history on file. No family history on file. History  Substance Use Topics  . Smoking status: Never Smoker   . Smokeless tobacco: Not on file  . Alcohol Use: No    Review of Systems  HENT: Positive for nosebleeds.   Neurological: Negative for dizziness.  All other systems reviewed and are negative.    Allergies  Review of patient's allergies indicates no known allergies.  Home Medications   Current Outpatient Rx  Name  Route  Sig  Dispense  Refill  . aspirin EC 81 MG tablet   Oral   Take 81 mg by mouth daily.         . furosemide (LASIX)  40 MG tablet   Oral   Take 40 mg by mouth daily.         Marland Kitchen ibuprofen (ADVIL,MOTRIN) 200 MG tablet   Oral   Take 200-400 mg by mouth every 6 (six) hours as needed (pain).         Marland Kitchen lisinopril (PRINIVIL,ZESTRIL) 10 MG tablet   Oral   Take 10 mg by mouth daily.         . meloxicam (MOBIC) 7.5 MG tablet   Oral   Take 7.5 mg by mouth daily as needed. For pain          There were no vitals taken for this visit. Physical Exam  Nursing note and vitals reviewed. Constitutional: He is oriented to person, place, and time. He appears well-developed and well-nourished.  HENT:  Head: Normocephalic and atraumatic.  Nose: Epistaxis is observed.  Brisk bleeding running out of the left nare without clot present.  Blood in the oropharynx  Eyes: EOM are normal. Pupils are equal, round, and reactive to light.  Cardiovascular: Normal rate.   Pulmonary/Chest: Effort normal.  Neurological: He is alert and oriented to person, place, and time.  Skin: Skin is warm and dry. No rash noted. No erythema.  Psychiatric: He has a normal mood and affect. His behavior is normal.    ED Course  EPISTAXIS MANAGEMENT Date/Time: 06/12/2013 8:17 AM Performed by: Gwyneth Sprout Authorized by: Anitra Lauth  Robert Gardner Consent: Verbal consent obtained. Risks and benefits: risks, benefits and alternatives were discussed Consent given by: patient and spouse Patient identity confirmed: verbally with patient Patient sedated: no Treatment site: left anterior Repair method: anterior pack Post-procedure assessment: bleeding stopped Treatment complexity: simple Patient tolerance: Patient tolerated the procedure well with no immediate complications. Comments: 4.5 anterior rhinorocket placed   (including critical care time) Labs Review Labs Reviewed - No data to display Imaging Review No results found.  EKG Interpretation   None       MDM   1. Acute anterior epistaxis     Pt with left anterior  nosebleed.  He is not on anticoagulation and based on family member has had intermittent issues this left nare bleeding.  Recent URI sx over the last week and nose bleeding off and on but started briskly today 1hr pta.  Pt bleeding briskly out of left nare without improvement with neosynephrin.  Packing placed with complete resolution of bleed.  8:55 AM Bleeding remained controlled.  Pt d/ced home with ENT f/u.  Gwyneth Sprout, MD 06/12/13 1610  Gwyneth Sprout, MD 06/12/13 [phone]

## 2013-07-22 ENCOUNTER — Ambulatory Visit: Payer: 59 | Admitting: Cardiology

## 2013-09-18 ENCOUNTER — Ambulatory Visit: Payer: 59 | Admitting: Nurse Practitioner

## 2013-10-08 ENCOUNTER — Ambulatory Visit: Payer: 59 | Admitting: Nurse Practitioner

## 2013-10-09 ENCOUNTER — Observation Stay (HOSPITAL_COMMUNITY)
Admission: EM | Admit: 2013-10-09 | Discharge: 2013-10-12 | Disposition: A | Discharge status: Home / Self Care | Payer: 59 | Attending: Cardiology | Admitting: Cardiology

## 2013-10-09 ENCOUNTER — Telehealth: Payer: Self-pay | Admitting: Cardiology

## 2013-10-09 ENCOUNTER — Encounter (HOSPITAL_COMMUNITY): Payer: Self-pay | Admitting: Emergency Medicine

## 2013-10-09 ENCOUNTER — Emergency Department (HOSPITAL_COMMUNITY): Payer: 59

## 2013-10-09 DIAGNOSIS — Z87448 Personal history of other diseases of urinary system: Secondary | ICD-10-CM | POA: Diagnosis not present

## 2013-10-09 DIAGNOSIS — I059 Rheumatic mitral valve disease, unspecified: Secondary | ICD-10-CM | POA: Insufficient documentation

## 2013-10-09 DIAGNOSIS — I4892 Unspecified atrial flutter: Principal | ICD-10-CM

## 2013-10-09 DIAGNOSIS — Z79899 Other long term (current) drug therapy: Secondary | ICD-10-CM | POA: Insufficient documentation

## 2013-10-09 DIAGNOSIS — R0609 Other forms of dyspnea: Secondary | ICD-10-CM | POA: Diagnosis not present

## 2013-10-09 DIAGNOSIS — I5033 Acute on chronic diastolic (congestive) heart failure: Secondary | ICD-10-CM

## 2013-10-09 DIAGNOSIS — R0989 Other specified symptoms and signs involving the circulatory and respiratory systems: Secondary | ICD-10-CM | POA: Insufficient documentation

## 2013-10-09 DIAGNOSIS — E049 Nontoxic goiter, unspecified: Secondary | ICD-10-CM

## 2013-10-09 DIAGNOSIS — Z7982 Long term (current) use of aspirin: Secondary | ICD-10-CM | POA: Insufficient documentation

## 2013-10-09 DIAGNOSIS — Z8669 Personal history of other diseases of the nervous system and sense organs: Secondary | ICD-10-CM | POA: Diagnosis not present

## 2013-10-09 DIAGNOSIS — I4891 Unspecified atrial fibrillation: Secondary | ICD-10-CM | POA: Diagnosis not present

## 2013-10-09 DIAGNOSIS — I10 Essential (primary) hypertension: Secondary | ICD-10-CM

## 2013-10-09 DIAGNOSIS — R791 Abnormal coagulation profile: Secondary | ICD-10-CM | POA: Diagnosis not present

## 2013-10-09 DIAGNOSIS — R5383 Other fatigue: Secondary | ICD-10-CM

## 2013-10-09 DIAGNOSIS — I340 Nonrheumatic mitral (valve) insufficiency: Secondary | ICD-10-CM

## 2013-10-09 DIAGNOSIS — R079 Chest pain, unspecified: Secondary | ICD-10-CM | POA: Diagnosis present

## 2013-10-09 DIAGNOSIS — R7989 Other specified abnormal findings of blood chemistry: Secondary | ICD-10-CM

## 2013-10-09 DIAGNOSIS — R5381 Other malaise: Secondary | ICD-10-CM | POA: Diagnosis not present

## 2013-10-09 DIAGNOSIS — I441 Atrioventricular block, second degree: Secondary | ICD-10-CM

## 2013-10-09 HISTORY — DX: Nonrheumatic mitral (valve) insufficiency: I34.0

## 2013-10-09 HISTORY — DX: Atrioventricular block, second degree: I44.1

## 2013-10-09 HISTORY — DX: Unspecified atrial flutter: I48.92

## 2013-10-09 HISTORY — DX: Nontoxic goiter, unspecified: E04.9

## 2013-10-09 HISTORY — DX: Anemia, unspecified: D64.9

## 2013-10-09 HISTORY — DX: Unspecified combined systolic (congestive) and diastolic (congestive) heart failure: I50.40

## 2013-10-09 LAB — CBC
HCT: 33.3 % — ABNORMAL LOW (ref 39.0–52.0)
HEMOGLOBIN: 11.1 g/dL — AB (ref 13.0–17.0)
MCH: 30.2 pg (ref 26.0–34.0)
MCHC: 33.3 g/dL (ref 30.0–36.0)
MCV: 90.7 fL (ref 78.0–100.0)
Platelets: 208 10*3/uL (ref 150–400)
RBC: 3.67 MIL/uL — AB (ref 4.22–5.81)
RDW: 14.8 % (ref 11.5–15.5)
WBC: 5.1 10*3/uL (ref 4.0–10.5)

## 2013-10-09 LAB — I-STAT TROPONIN, ED: TROPONIN I, POC: 0.05 ng/mL (ref 0.00–0.08)

## 2013-10-09 LAB — BASIC METABOLIC PANEL
BUN: 17 mg/dL (ref 6–23)
CHLORIDE: 104 meq/L (ref 96–112)
CO2: 24 meq/L (ref 19–32)
Calcium: 9.4 mg/dL (ref 8.4–10.5)
Creatinine, Ser: 1.2 mg/dL (ref 0.50–1.35)
GFR calc Af Amer: 69 mL/min — ABNORMAL LOW (ref >90)
GFR calc non Af Amer: 60 mL/min — ABNORMAL LOW (ref >90)
GLUCOSE: 86 mg/dL (ref 70–99)
POTASSIUM: 3.7 meq/L (ref 3.7–5.3)
SODIUM: 139 meq/L (ref 137–147)

## 2013-10-09 LAB — MAGNESIUM: Magnesium: 1.6 mg/dL (ref 1.5–2.5)

## 2013-10-09 LAB — D-DIMER, QUANTITATIVE (NOT AT ARMC): D DIMER QUANT: 0.66 ug{FEU}/mL — AB (ref 0.00–0.48)

## 2013-10-09 LAB — TSH: TSH: 0.631 u[IU]/mL (ref 0.350–4.500)

## 2013-10-09 LAB — TROPONIN I

## 2013-10-09 LAB — PRO B NATRIURETIC PEPTIDE: Pro B Natriuretic peptide (BNP): 2323 pg/mL — ABNORMAL HIGH (ref 0–125)

## 2013-10-09 MED ORDER — HYDRALAZINE HCL 20 MG/ML IJ SOLN
10.0000 mg | INTRAMUSCULAR | Status: DC | PRN
  Administered 2013-10-09: 10 mg via INTRAVENOUS
  Filled 2013-10-09: qty 1

## 2013-10-09 MED ORDER — POTASSIUM CHLORIDE CRYS ER 20 MEQ PO TBCR
20.0000 meq | EXTENDED_RELEASE_TABLET | Freq: Every day | ORAL | Status: DC
  Administered 2013-10-09 – 2013-10-12 (×4): 20 meq via ORAL
  Filled 2013-10-09 (×4): qty 1

## 2013-10-09 MED ORDER — SODIUM CHLORIDE 0.9 % IJ SOLN
3.0000 mL | Freq: Two times a day (BID) | INTRAMUSCULAR | Status: DC
Start: 2013-10-09 — End: 2013-10-12
  Administered 2013-10-10 – 2013-10-11 (×3): 3 mL via INTRAVENOUS

## 2013-10-09 MED ORDER — HEPARIN BOLUS VIA INFUSION
4000.0000 [IU] | Freq: Once | INTRAVENOUS | Status: AC
  Administered 2013-10-09: 4000 [IU] via INTRAVENOUS
  Filled 2013-10-09: qty 4000

## 2013-10-09 MED ORDER — HEPARIN (PORCINE) IN NACL 100-0.45 UNIT/ML-% IJ SOLN
1000.0000 [IU]/h | INTRAMUSCULAR | Status: DC
  Administered 2013-10-09: 1000 [IU]/h via INTRAVENOUS
  Filled 2013-10-09 (×2): qty 250

## 2013-10-09 MED ORDER — SODIUM CHLORIDE 0.9 % IV SOLN
250.0000 mL | INTRAVENOUS | Status: DC | PRN
Start: 2013-10-09 — End: 2013-10-12

## 2013-10-09 MED ORDER — ASPIRIN EC 81 MG PO TBEC
81.0000 mg | DELAYED_RELEASE_TABLET | Freq: Every day | ORAL | Status: DC
  Administered 2013-10-09: 81 mg via ORAL
  Filled 2013-10-09 (×2): qty 1

## 2013-10-09 MED ORDER — FUROSEMIDE 10 MG/ML IJ SOLN
40.0000 mg | Freq: Once | INTRAMUSCULAR | Status: AC
  Administered 2013-10-09: 40 mg via INTRAVENOUS
  Filled 2013-10-09: qty 4

## 2013-10-09 MED ORDER — LISINOPRIL 10 MG PO TABS
10.0000 mg | ORAL_TABLET | Freq: Every day | ORAL | Status: DC
  Administered 2013-10-09: 10 mg via ORAL
  Filled 2013-10-09 (×2): qty 1

## 2013-10-09 MED ORDER — NITROGLYCERIN 0.4 MG SL SUBL: 0.4000 mg | SUBLINGUAL_TABLET | SUBLINGUAL | Status: DC | PRN

## 2013-10-09 MED ORDER — ACETAMINOPHEN 325 MG PO TABS
650.0000 mg | ORAL_TABLET | Freq: Once | ORAL | Status: AC
  Administered 2013-10-09: 650 mg via ORAL
  Filled 2013-10-09: qty 2

## 2013-10-09 MED ORDER — SODIUM CHLORIDE 0.9 % IJ SOLN: 3.0000 mL | INTRAMUSCULAR | Status: DC | PRN

## 2013-10-09 MED ORDER — FUROSEMIDE 10 MG/ML IJ SOLN
40.0000 mg | Freq: Every day | INTRAMUSCULAR | Status: DC
  Administered 2013-10-10: 40 mg via INTRAVENOUS
  Filled 2013-10-09 (×2): qty 4

## 2013-10-09 MED ORDER — ACETAMINOPHEN 325 MG PO TABS: 650.0000 mg | ORAL_TABLET | Freq: Three times a day (TID) | ORAL | Status: DC | PRN

## 2013-10-09 NOTE — ED Provider Notes (Signed)
CSN: [id number]     Arrival date & time 10/09/13  1354 History   First MD Initiated Contact with Patient 10/09/13 1637     Chief Complaint  Patient presents with  . Chest Pain     (Consider location/radiation/quality/duration/timing/severity/associated sxs/prior Treatment) HPI Patient presents with internal chest pain, dyspnea, fatigue. Symptoms began approximately 3 days ago.  Since onset symptoms have been intermittent, with no clear precipitating, alleviating, exacerbating factors. The pain is anterior, sternal, sore, moderate/severe. There is associated dyspnea, but no pleuritic pain, no exertional pain. There is lightheadedness, but no syncope.  Past Medical History  Diagnosis Date  . Hypertension   . 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 Bell palsy   . Erectile dysfunction   . Atrial fibrillation   . Seizure     history of seizure 10/2011 treated with Keppra (MRI of brain revealed no acute abnormality 11/06/11)  . Edema extremities    History reviewed. No pertinent past surgical history. History reviewed. No pertinent family history. History  Substance Use Topics  . Smoking status: Never Smoker   . Smokeless tobacco: Not on file  . Alcohol Use: No    Review of Systems  Constitutional:       Per HPI, otherwise negative  HENT:       Per HPI, otherwise negative  Respiratory:       Per HPI, otherwise negative  Cardiovascular:       Per HPI, otherwise negative  Gastrointestinal: Negative for vomiting.  Endocrine:       Negative aside from HPI  Genitourinary:       Neg aside from HPI   Musculoskeletal:       Per HPI, otherwise negative  Skin: Negative.   Neurological: Negative for syncope.      Allergies  Review of patient's allergies indicates no known allergies.  Home Medications   Prior to Admission medications   Medication Sig Start Date End Date Taking? Authorizing Provider  acetaminophen (TYLENOL) 650 MG CR  tablet Take 650 mg by mouth every 8 (eight) hours as needed for pain.   Yes Historical Provider, MD  aspirin EC 81 MG tablet Take 81 mg by mouth daily.   Yes Historical Provider, MD  furosemide (LASIX) 40 MG tablet Take 40 mg by mouth daily.   Yes Historical Provider, MD  ibuprofen (ADVIL,MOTRIN) 200 MG tablet Take 200-400 mg by mouth every 6 (six) hours as needed (pain).   Yes Historical Provider, MD  lisinopril (PRINIVIL,ZESTRIL) 10 MG tablet Take 10 mg by mouth daily.   Yes Historical Provider, MD  meloxicam (MOBIC) 7.5 MG tablet Take 7.5 mg by mouth daily as needed. For pain   Yes Historical Provider, MD   BP 184/100  Pulse 51  Temp(Src) 98.3 F (36.8 C) (Oral)  Resp 18  SpO2 100% Physical Exam  Nursing note and vitals reviewed. Constitutional: He is oriented to person, place, and time. He appears well-developed. No distress.  HENT:  Head: Normocephalic and atraumatic.  Eyes: Conjunctivae and EOM are normal.  Cardiovascular: Normal rate and regular rhythm.   Pulmonary/Chest: Effort normal. No stridor. No respiratory distress.  Abdominal: He exhibits no distension.  Musculoskeletal: He exhibits no edema.  Neurological: He is alert and oriented to person, place, and time.  Skin: Skin is warm and dry.  Psychiatric: He has a normal mood and affect.    ED Course  Procedures (including critical care time) Labs Review  Labs Reviewed  CBC - Abnormal; Notable for the following:    RBC 3.67 (*)    Hemoglobin 11.1 (*)    HCT 33.3 (*)    All other components within normal limits  BASIC METABOLIC PANEL - Abnormal; Notable for the following:    GFR calc non Af Amer 60 (*)    GFR calc Af Amer 69 (*)    All other components within normal limits  PRO B NATRIURETIC PEPTIDE - Abnormal; Notable for the following:    Pro B Natriuretic peptide (BNP) 2323.0 (*)    All other components within normal limits  I-STAT TROPOININ, ED     Cardiac monitor shows atrial fibrillation, rate 60,  abnormal Pulse ox 99% room air normal   EKG shows atrial flutter, with intermittent block. Rate 150 atria, abnormal   MDM   Patient presents with new chest pain, dyspnea.  On exam patient is awake and alert, though he appears fatigued. Patient is in no distress, but his labs demonstrate   heart failure exacerbation, and his EKG demonstrates atrial flutter with intermittent conduction. Given his pain, a flutter is new, and heart failure exacerbation, he was admitted for further evaluation, management after initiation of diuretic therapy here.   Carmin Muskrat, MD 10/09/13 [phone]

## 2013-10-09 NOTE — Telephone Encounter (Signed)
Wife called stating her husband just called her and said he was having CP. She states he hasn't seen Dr. Radford Pax in over a year. She said he has been having dizziness and SOB recently.  Advised her to take him to [facility] ER.  She agreed and will go to ER.

## 2013-10-09 NOTE — ED Notes (Signed)
PT is back from xray placed on monitor, cardiology at bedside

## 2013-10-09 NOTE — H&P (Signed)
Cardiologist - Turner  HPI: 73 year old male with past medical history of atrial fibrillation, diastolic congestive heart failure, moderate mitral regurgitation, hypertension for evaluation of congestive heart failure, chest pain and atrial flutter. Patient had an echocardiogram in may of 2014 that showed low normal LV function, left ventricular hypertrophy, grade 2 diastolic dysfunction, moderate mitral regurgitation. Previous monitor in May of 2014 showed type I second-degree AV block by report. Patient typically does not have dyspnea on exertion, orthopnea, PND, palpitations, syncope or exertional chest pain. He traveled to [place] last week and noticed more dyspnea with walking longer distances. Since returning home over the past 3 days he continued to have increased dyspnea on exertion but no orthopnea or PND. He noticed mild pedal edema. He denied palpitations or syncope. He also describes chest pain. The pain is described as a pounding on the right side of his chest. Increased with certain movements and inspiration. He had a severe episode today associated with vomiting. He presently has minimal pain. Note he also states he occasionally misses his medications.   (Not in a hospital admission)  No Known Allergies  Past Medical History  Diagnosis Date  . Hypertension   . Chronic diastolic CHF (congestive heart failure)     grade II diasotlic dysfunction by echo  . Bell palsy   . Erectile dysfunction   . Atrial fibrillation   . Seizure     a. Hx of seizure 10/2011 treated with Keppra (MRI of brain revealed no acute abnormality 11/06/11) - pt denies knowlege of this.  . Mobitz (type) I (Wenckebach's) atrioventricular block     a. By Holter 10/2012.  . Mitral regurgitation     Past Surgical History  Procedure Laterality Date  . Resection of melanoma      History   Social History  . Marital Status: Unknown    Spouse Name: N/A    Number of Children: N/A  . Years of Education: N/A    Occupational History  . Not on file.   Social History Main Topics  . Smoking status: Never Smoker   . Smokeless tobacco: Not on file  . Alcohol Use: No  . Drug Use: No  . Sexual Activity: Not on file   Other Topics Concern  . Not on file   Social History Narrative  . No narrative on file    Family History  Problem Relation Age of Onset  . Heart disease Mother     unclear details  . Heart disease Father     unclear details  . Heart disease Sister     unclear details    ROS:  no fevers or chills, productive cough, hemoptysis, dysphasia, odynophagia, melena, hematochezia, dysuria, hematuria, rash, seizure activity, orthopnea, PND, claudication. Remaining systems are negative.  Physical Exam:   Blood pressure 204/111, pulse 65, temperature 98.3 F (36.8 C), temperature source Oral, resp. rate 16, weight 167 lb 2 oz (75.807 kg), SpO2 100.00%.  General:  Well developed/well nourished in NAD Skin warm/dry Patient not depressed No peripheral clubbing Back-normal HEENT-normal/normal eyelids Neck supple/normal carotid upstroke bilaterally; no bruits; positive JVD; no thyromegaly chest - Minimal basilar crackles CV - irregular/bradycardic/normal S1 and S2; no rubs or gallops;  PMI nondisplaced, 3/6 systolic murmur apex. Abdomen -NT/ND, no HSM, no mass, + bowel sounds, no bruit 2+ femoral pulses, no bruits Ext-trace edema, no chords, 2+ DP Neuro-grossly nonfocal   ECG Atrial flutter at a rate of 60. Occasional PVCs or aberrantly conducted beats. Left ventricular hypertrophy  with nonspecific ST changes.  Results for orders placed during the hospital encounter of 10/09/13 (from the past 48 hour(s))  CBC     Status: Abnormal   Collection Time    10/09/13  2:07 PM      Result Value Ref Range   WBC 5.1  4.0 - 10.5 K/uL   RBC 3.67 (*) 4.22 - 5.81 MIL/uL   Hemoglobin 11.1 (*) 13.0 - 17.0 g/dL   HCT 33.3 (*) 39.0 - 52.0 %   MCV 90.7  78.0 - 100.0 fL   MCH 30.2  26.0 - 34.0  pg   MCHC 33.3  30.0 - 36.0 g/dL   RDW 14.8  11.5 - 15.5 %   Platelets 208  150 - 400 K/uL  BASIC METABOLIC PANEL     Status: Abnormal   Collection Time    10/09/13  2:07 PM      Result Value Ref Range   Sodium 139  137 - 147 mEq/L   Potassium 3.7  3.7 - 5.3 mEq/L   Chloride 104  96 - 112 mEq/L   CO2 24  19 - 32 mEq/L   Glucose, Bld 86  70 - 99 mg/dL   BUN 17  6 - 23 mg/dL   Creatinine, Ser 1.20  0.50 - 1.35 mg/dL   Calcium 9.4  8.4 - 10.5 mg/dL   GFR calc non Af Amer 60 (*) >90 mL/min   GFR calc Af Amer 69 (*) >90 mL/min   Comment: (NOTE)     The eGFR has been calculated using the CKD EPI equation.     This calculation has not been validated in all clinical situations.     eGFR's persistently <90 mL/min signify possible Chronic Kidney     Disease.  PRO B NATRIURETIC PEPTIDE     Status: Abnormal   Collection Time    10/09/13  2:07 PM      Result Value Ref Range   Pro B Natriuretic peptide (BNP) 2323.0 (*) 0 - 125 pg/mL  I-STAT TROPOININ, ED     Status: None   Collection Time    10/09/13  3:22 PM      Result Value Ref Range   Troponin i, poc 0.05  0.00 - 0.08 ng/mL   Comment 3            Comment: Due to the release kinetics of cTnI,     a negative result within the first hours     of the onset of symptoms does not rule out     myocardial infarction with certainty.     If myocardial infarction is still suspected,     repeat the test at appropriate intervals.    Dg Chest 2 View  10/09/2013   CLINICAL DATA:  Right-sided chest pain with shortness of breath.  EXAM: CHEST  2 VIEW  COMPARISON:  None.  FINDINGS: Trachea may be slightly deviated to the left which can be seen with right thyroid enlargement. Heart is enlarged. Perihilar and basilar predominant interstitial prominence and indistinctness. Biapical pleural parenchymal scarring. Fissural thickening may be due to fluid. Visualized portion of the upper abdomen is unremarkable.  IMPRESSION: Congestive heart failure.    Electronically Signed   By: Lorin Picket M.D.   On: 10/09/2013 18:16    Assessment/Plan 1 chest pain-patient symptoms are right-sided and increased with inspiration and certain movements. Description is atypical for ischemia. Cycle enzymes. Given recent travel to [place] check d-dimer and if elevated schedule  VQ scan. 2 acute on chronic diastolic congestive heart failure-patient is volume overloaded on examination. I will treat with Lasix 40 mg IV daily. Follow renal function closely. Check echocardiogram for LV function. He has a significant mitral regurgitation murmur on examination. He was noted to have moderate MR on previous echo. Question if this is contributing to his heart failure. 3 atrial flutter-the patient is in atrial flutter with a slow ventricular response which could also be contributing to CHF. He also carries a diagnosis of atrial fibrillation. I therefore do not think we should proceed with atrial flutter ablation. His rate is slow and previous Holter monitor instead to have Mobitz 1 second-degree AV block. Avoid AV nodal blocking agents and follow on telemetry. Begin heparin. If enzymes are negative would transition to apixaban 5 mg by mouth twice a day tomorrow morning. Proceed with cardioversion 3 weeks later or sooner if he continues to have symptoms despite diuresis. Check TSH and await echocardiogram. 4 mitral regurgitation-plan repeat echocardiogram. 5 hypertension-patient's blood pressure is significantly elevated. He has not received his medications today yet. Resume lisinopril and adjust regimen as needed. 6 normocytic anemia-In reviewing his laboratories he had a mild anemia in may of 2013. This will need further workup with his primary care following discharge. Watch for bleeding and follow hemoglobin. 7 chest x-ray shows congestive heart failure and slight deviation of the trachea to the left possibly secondary to thyroid enlargement. Check TSH and thyroid  ultrasound.  Kirk Ruths MD 10/09/2013, 6:26 PM

## 2013-10-09 NOTE — Progress Notes (Signed)
ANTICOAGULATION CONSULT NOTE - Initial Consult  Pharmacy Consult for heparin Indication: atrial fibrillation  No Known Allergies  Patient Measurements: Weight: 167 lb 2 oz (75.807 kg)  Vital Signs: Temp: 98.3 F (36.8 C) (04/17 1511) Temp src: Oral (04/17 1511) BP: 204/111 mmHg (04/17 1730) Pulse Rate: 65 (04/17 1730)  Labs:  Recent Labs  10/09/13 1407  HGB 11.1*  HCT 33.3*  PLT 208  CREATININE 1.20    CrCl is unknown because there is no height on file for the current visit.   Medical History: Past Medical History  Diagnosis Date  . Hypertension   . Chronic diastolic CHF (congestive heart failure)     grade II diasotlic dysfunction by echo  . Bell palsy   . Erectile dysfunction   . Atrial fibrillation   . Seizure     a. Hx of seizure 10/2011 treated with Keppra (MRI of brain revealed no acute abnormality 11/06/11) - pt denies knowlege of this.  . Mobitz (type) I (Wenckebach's) atrioventricular block     a. By Holter 10/2012.  . Mitral regurgitation     Medications:  Infusions:  . heparin    . heparin      Assessment: 18 yom presented to the ED with SOB and increasing DOE. Found to be in aflutter and to start IV heparin for anticoagulation. He is has a known history of afib but he is not on any anticoagulation PTA. Baseline H/H are a bit low but plts are WNL.   Goal of Therapy:  Heparin level 0.3-0.7 units/ml Monitor platelets by anticoagulation protocol: Yes   Plan:  1. Heparin bolus 4000 units IV x 1 2. Heparin gtt 1000 units/hr 3. Check an 8 hour heparin level 4. Daily CBC and heparin level 5. F/u plans for oral anticoagulation   Rande Lawman Timohty Renbarger 10/09/2013,7:46 PM

## 2013-10-09 NOTE — ED Notes (Signed)
[facility] requesting that cardiology be paged regarding patients blood pressure.  Dr. Haroldine Laws with cardiology returned page and gave verbal order for medication-see chart.  Patient will be going to floor once pressure is systolic less than 626.

## 2013-10-09 NOTE — Telephone Encounter (Signed)
New message    Wife calling does not know his work number . Cell phone  [phone].  Patient is a bus Geophysicist/field seismologist for [employer] .    Patient at work now . C/O chest pain . Wife is asking for appointment today.

## 2013-10-10 DIAGNOSIS — I509 Heart failure, unspecified: Secondary | ICD-10-CM

## 2013-10-10 DIAGNOSIS — I10 Essential (primary) hypertension: Secondary | ICD-10-CM

## 2013-10-10 DIAGNOSIS — I5033 Acute on chronic diastolic (congestive) heart failure: Secondary | ICD-10-CM

## 2013-10-10 DIAGNOSIS — E049 Nontoxic goiter, unspecified: Secondary | ICD-10-CM

## 2013-10-10 DIAGNOSIS — I4891 Unspecified atrial fibrillation: Secondary | ICD-10-CM

## 2013-10-10 DIAGNOSIS — I4892 Unspecified atrial flutter: Secondary | ICD-10-CM | POA: Diagnosis not present

## 2013-10-10 DIAGNOSIS — R791 Abnormal coagulation profile: Secondary | ICD-10-CM

## 2013-10-10 DIAGNOSIS — I059 Rheumatic mitral valve disease, unspecified: Secondary | ICD-10-CM

## 2013-10-10 LAB — HEMOGLOBIN A1C
Hgb A1c MFr Bld: 5.8 % — ABNORMAL HIGH (ref <5.7)
Mean Plasma Glucose: 120 mg/dL — ABNORMAL HIGH (ref <117)

## 2013-10-10 LAB — LIPID PANEL
CHOL/HDL RATIO: 1.8 ratio
Cholesterol: 122 mg/dL (ref 0–200)
HDL: 69 mg/dL (ref >39)
LDL Cholesterol: 46 mg/dL (ref 0–99)
TRIGLYCERIDES: 34 mg/dL (ref <150)
VLDL: 7 mg/dL (ref 0–40)

## 2013-10-10 LAB — COMPREHENSIVE METABOLIC PANEL
ALK PHOS: 102 U/L (ref 39–117)
ALT: 16 U/L (ref 0–53)
AST: 24 U/L (ref 0–37)
Albumin: 3 g/dL — ABNORMAL LOW (ref 3.5–5.2)
BUN: 16 mg/dL (ref 6–23)
CO2: 24 mEq/L (ref 19–32)
CREATININE: 1.16 mg/dL (ref 0.50–1.35)
Calcium: 9 mg/dL (ref 8.4–10.5)
Chloride: 105 mEq/L (ref 96–112)
GFR calc non Af Amer: 62 mL/min — ABNORMAL LOW (ref >90)
GFR, EST AFRICAN AMERICAN: 72 mL/min — AB (ref >90)
Glucose, Bld: 83 mg/dL (ref 70–99)
Potassium: 3.6 mEq/L — ABNORMAL LOW (ref 3.7–5.3)
Sodium: 141 mEq/L (ref 137–147)
Total Bilirubin: 0.8 mg/dL (ref 0.3–1.2)
Total Protein: 7.5 g/dL (ref 6.0–8.3)

## 2013-10-10 LAB — CBC
HEMATOCRIT: 31.2 % — AB (ref 39.0–52.0)
HEMOGLOBIN: 10.4 g/dL — AB (ref 13.0–17.0)
MCH: 30.2 pg (ref 26.0–34.0)
MCHC: 33.3 g/dL (ref 30.0–36.0)
MCV: 90.7 fL (ref 78.0–100.0)
Platelets: 191 10*3/uL (ref 150–400)
RBC: 3.44 MIL/uL — AB (ref 4.22–5.81)
RDW: 15 % (ref 11.5–15.5)
WBC: 4.7 10*3/uL (ref 4.0–10.5)

## 2013-10-10 LAB — TROPONIN I

## 2013-10-10 LAB — HEPARIN LEVEL (UNFRACTIONATED): Heparin Unfractionated: 0.4 IU/mL (ref 0.30–0.70)

## 2013-10-10 LAB — T4, FREE: FREE T4: 1.43 ng/dL (ref 0.80–1.80)

## 2013-10-10 MED ORDER — POTASSIUM CHLORIDE 20 MEQ PO PACK: 40.0000 meq | PACK | Freq: Once | ORAL | Status: DC

## 2013-10-10 MED ORDER — LISINOPRIL 20 MG PO TABS
20.0000 mg | ORAL_TABLET | Freq: Every day | ORAL | Status: DC
Start: 2013-10-10 — End: 2013-10-12
  Administered 2013-10-10 – 2013-10-12 (×3): 20 mg via ORAL
  Filled 2013-10-10 (×3): qty 1

## 2013-10-10 MED ORDER — APIXABAN 5 MG PO TABS
5.0000 mg | ORAL_TABLET | Freq: Two times a day (BID) | ORAL | Status: DC
  Administered 2013-10-10 – 2013-10-12 (×5): 5 mg via ORAL
  Filled 2013-10-10 (×6): qty 1

## 2013-10-10 MED ORDER — POTASSIUM CHLORIDE CRYS ER 20 MEQ PO TBCR
40.0000 meq | EXTENDED_RELEASE_TABLET | Freq: Once | ORAL | Status: AC
  Administered 2013-10-10: 40 meq via ORAL
  Filled 2013-10-10: qty 2

## 2013-10-10 NOTE — Progress Notes (Signed)
SUBJECTIVE: Pt feeling better and asks, "Can I go home?". Denies chest pain and shortness of breath, and says leg swelling has gone down tremendously. Denies bleeding problems.     Intake/Output Summary (Last 24 hours) at 10/10/13 0827 Last data filed at 10/10/13 7673  Gross per 24 hour  Intake    240 ml  Output   3000 ml  Net  -2760 ml    Current Facility-Administered Medications  Medication Dose Route Frequency Provider Last Rate Last Dose  . 0.9 %  sodium chloride infusion  250 mL Intravenous PRN Dayna N Dunn, PA-C      . acetaminophen (TYLENOL) tablet 650 mg  650 mg Oral Q8H PRN Dayna N Dunn, PA-C      . aspirin EC tablet 81 mg  81 mg Oral Daily Dayna N Dunn, PA-C   81 mg at 10/09/13 2256  . furosemide (LASIX) injection 40 mg  40 mg Intravenous Daily Dayna N Dunn, PA-C      . heparin ADULT infusion 100 units/mL (25000 units/250 mL)  1,000 Units/hr Intravenous Continuous Rande Lawman Rumbarger, RPH 10 mL/hr at 10/09/13 2001 1,000 Units/hr at 10/09/13 2001  . hydrALAZINE (APRESOLINE) injection 10 mg  10 mg Intravenous Q4H PRN Jolaine Artist, MD   10 mg at 10/09/13 2028  . lisinopril (PRINIVIL,ZESTRIL) tablet 20 mg  20 mg Oral Daily Herminio Commons, MD      . nitroGLYCERIN (NITROSTAT) SL tablet 0.4 mg  0.4 mg Sublingual Q5 Min x 3 PRN Dayna N Dunn, PA-C      . potassium chloride SA (K-DUR,KLOR-CON) CR tablet 20 mEq  20 mEq Oral Daily Dayna N Dunn, PA-C   20 mEq at 10/09/13 2255  . sodium chloride 0.9 % injection 3 mL  3 mL Intravenous Q12H Dayna N Dunn, PA-C      . sodium chloride 0.9 % injection 3 mL  3 mL Intravenous PRN Dayna N Dunn, PA-C        Filed Vitals:   10/09/13 2045 10/09/13 2114 10/10/13 0500 10/10/13 0654  BP: 164/74 176/78  159/79  Pulse: 60 85  81  Temp:  98.2 F (36.8 C)  98.6 F (37 C)  TempSrc:  Oral  Oral  Resp: 25 18  18   Height:  5\' 11"  (1.803 m)    Weight:  168 lb (76.204 kg) 168 lb (76.204 kg)   SpO2: 100% 100%  100%    PHYSICAL  EXAM General: NAD Neck: +JVD (JVP 9-10 cm H2O), no thyromegaly.  Lungs: Clear to auscultation bilaterally with normal respiratory effort. CV: Nondisplaced PMI. Irregular rhythm, normal rate, normal S1/S2, no S3, 3/6 apical holosystolic murmur with radiation to axilla.  No pretibial edema.  No carotid bruit.  Normal pedal pulses.  Abdomen: Soft, nontender, no hepatosplenomegaly, no distention.  Neurologic: Alert and oriented x 3.  Psych: Normal affect. Extremities: No clubbing or cyanosis.   TELEMETRY: Reviewed telemetry pt in atrial flutter with variable conduction and PVC's. No high ventricular rates.  LABS: Basic Metabolic Panel:  Recent Labs  10/09/13 1407 10/09/13 2240 10/10/13 0340  NA 139  --  141  K 3.7  --  3.6*  CL 104  --  105  CO2 24  --  24  GLUCOSE 86  --  83  BUN 17  --  16  CREATININE 1.20  --  1.16  CALCIUM 9.4  --  9.0  MG  --  1.6  --  Liver Function Tests:  Recent Labs  10/10/13 0340  AST 24  ALT 16  ALKPHOS 102  BILITOT 0.8  PROT 7.5  ALBUMIN 3.0*   No results found for this basename: LIPASE, AMYLASE,  in the last 72 hours CBC:  Recent Labs  10/09/13 1407 10/10/13 0340  WBC 5.1 4.7  HGB 11.1* 10.4*  HCT 33.3* 31.2*  MCV 90.7 90.7  PLT 208 191   Cardiac Enzymes:  Recent Labs  10/09/13 1955 10/10/13 0051  TROPONINI <0.30 <0.30   BNP: No components found with this basename: POCBNP,  D-Dimer:  Recent Labs  10/09/13 1955  DDIMER 0.66*   Hemoglobin A1C: No results found for this basename: HGBA1C,  in the last 72 hours Fasting Lipid Panel:  Recent Labs  10/10/13 0340  CHOL 122  HDL 69  LDLCALC 46  TRIG 34  CHOLHDL 1.8   Thyroid Function Tests:  Recent Labs  10/09/13 2240  TSH 0.631   Anemia Panel: No results found for this basename: VITAMINB12, FOLATE, FERRITIN, TIBC, IRON, RETICCTPCT,  in the last 72 hours  RADIOLOGY: Dg Chest 2 View  10/09/2013   CLINICAL DATA:  Right-sided chest pain with shortness of  breath.  EXAM: CHEST  2 VIEW  COMPARISON:  None.  FINDINGS: Trachea may be slightly deviated to the left which can be seen with right thyroid enlargement. Heart is enlarged. Perihilar and basilar predominant interstitial prominence and indistinctness. Biapical pleural parenchymal scarring. Fissural thickening may be due to fluid. Visualized portion of the upper abdomen is unremarkable.  IMPRESSION: Congestive heart failure.   Electronically Signed   By: Lorin Picket M.D.   On: 10/09/2013 18:16      ASSESSMENT AND PLAN: 1. Chest pain-Resolved. Troponins are normal. Although mildly elevated d-dimer, doubt PE. No further workup. 2. Acute on chronic diastolic congestive heart failure-LE swelling and pulmonary vascular congestion has improved considerably with Lasix 40 mg IV daily, which I will continue for one more day. Follow renal function closely. Give additional KCl this morning. Awaiting echocardiogram for LV function and to evaluate mitral regurgitation. He was noted to have moderate MR on previous echo. Question if this is contributing to his heart failure.  3. Atrial flutter-the patient is in atrial flutter with a slow ventricular response which could also be contributing to CHF. He also carries a diagnosis of atrial fibrillation. His rate is slow and previous Holter monitor instead to have Mobitz 1 second-degree AV block. Avoid AV nodal blocking agents and follow on telemetry. Will d/c ASA and heparin and start apixaban 5 mg by mouth twice a day. Proceed with cardioversion 3 weeks later or sooner if he continues to have symptoms despite diuresis. TSH normal. Await echocardiogram.  4. Mitral regurgitation-plan repeat echocardiogram.  5. Hypertension-patient's blood pressure remains elevated. Will increase lisinopril to 20 mg daily. 6. Normocytic anemia-In reviewing his laboratories he had a mild anemia in May of 2013. This will need further workup with his primary care following discharge. Watch  for bleeding and follow hemoglobin.  7. Chest x-ray showed congestive heart failure and slight deviation of the trachea to the left possibly secondary to thyroid enlargement. TSH normal. Check thyroid ultrasound as outpatient.    Kate Sable, M.D., F.A.C.C.

## 2013-10-10 NOTE — Progress Notes (Signed)
UR Completed Rennae Ferraiolo Graves-Bigelow, RN,BSN [phone]  

## 2013-10-10 NOTE — Progress Notes (Signed)
ANTICOAGULATION CONSULT NOTE  Pharmacy Consult for heparin Indication: atrial fibrillation  No Known Allergies  Patient Measurements: Height: 5\' 11"  (180.3 cm) Weight: 168 lb (76.204 kg) IBW/kg (Calculated) : 75.3  Vital Signs: Temp: 98.2 F (36.8 C) (04/17 2114) Temp src: Oral (04/17 2114) BP: 176/78 mmHg (04/17 2114) Pulse Rate: 85 (04/17 2114)  Labs:  Recent Labs  10/09/13 1407 10/09/13 1955 10/10/13 0051 10/10/13 0340  HGB 11.1*  --   --  10.4*  HCT 33.3*  --   --  31.2*  PLT 208  --   --  191  HEPARINUNFRC  --   --   --  0.40  CREATININE 1.20  --   --   --   TROPONINI  --  <0.30 <0.30  --     Estimated Creatinine Clearance: 61 ml/min (by C-G formula based on Cr of 1.2).  Assessment: 73 yo male with Afib/flutter for heparin  Goal of Therapy:  Heparin level 0.3-0.7 units/ml Monitor platelets by anticoagulation protocol: Yes   Plan:  Continue Heparin at current rate   [employer] Shironda Kain 10/10/2013,4:24 AM

## 2013-10-10 NOTE — Progress Notes (Signed)
CARE MANAGEMENT NOTE 10/10/2013  Patient:  Robert Gardner,Robert Gardner   Account Number:  [id number]  Date Initiated:  10/10/2013  Documentation initiated by:  [facility]  Subjective/Objective Assessment:   adm: pounding on the right side of his chest     Action/Plan:   discharge planning   Anticipated DC Date:  10/11/2013   Anticipated DC Plan:  [place]  CM consult  Medication Assistance      Choice offered to / List presented to:             Status of service:  Completed, signed off Medicare Important Message given?   (If response is "NO", the following Medicare IM given date fields will be blank) Date Medicare IM given:   Date Additional Medicare IM given:    Discharge Disposition:  HOME/SELF CARE  Per UR Regulation:    If discussed at Long Length of Stay Meetings, dates discussed:    Comments:  10/10/13 12:20 CM met with pt in room and activated his Eliquis free 30 day trial card.  CM also gave pt a discount co-pay card.  Pt verbalized understanding his free 30 day card will give him time to take his co-pay card to be activated at the follow up visit when the medication is called in for preauthorization.  No other CM needs were communicated.  Mariane Masters, BSN, CM [phone].

## 2013-10-11 DIAGNOSIS — I059 Rheumatic mitral valve disease, unspecified: Secondary | ICD-10-CM

## 2013-10-11 DIAGNOSIS — I4892 Unspecified atrial flutter: Secondary | ICD-10-CM | POA: Diagnosis not present

## 2013-10-11 LAB — BASIC METABOLIC PANEL
BUN: 17 mg/dL (ref 6–23)
CALCIUM: 9.2 mg/dL (ref 8.4–10.5)
CHLORIDE: 103 meq/L (ref 96–112)
CO2: 25 mEq/L (ref 19–32)
CREATININE: 1.21 mg/dL (ref 0.50–1.35)
GFR calc non Af Amer: 59 mL/min — ABNORMAL LOW (ref >90)
GFR, EST AFRICAN AMERICAN: 68 mL/min — AB (ref >90)
Glucose, Bld: 100 mg/dL — ABNORMAL HIGH (ref 70–99)
Potassium: 3.8 mEq/L (ref 3.7–5.3)
Sodium: 138 mEq/L (ref 137–147)

## 2013-10-11 MED ORDER — ACTIVE PARTNERSHIP FOR HEALTH OF YOUR HEART BOOK
Freq: Once | Status: AC
  Administered 2013-10-11: 17:00:00
  Filled 2013-10-11: qty 1

## 2013-10-11 MED ORDER — LIVING BETTER WITH HEART FAILURE BOOK
Freq: Once | Status: AC
  Administered 2013-10-10: 17:00:00
  Filled 2013-10-11: qty 1

## 2013-10-11 MED ORDER — OFF THE BEAT BOOK
Freq: Once | Status: AC
  Administered 2013-10-11: 17:00:00
  Filled 2013-10-11: qty 1

## 2013-10-11 MED ORDER — FUROSEMIDE 40 MG PO TABS
40.0000 mg | ORAL_TABLET | Freq: Every day | ORAL | Status: DC
  Administered 2013-10-11 – 2013-10-12 (×2): 40 mg via ORAL
  Filled 2013-10-11 (×2): qty 1

## 2013-10-11 NOTE — Progress Notes (Signed)
Utilization review completed.  

## 2013-10-11 NOTE — Progress Notes (Signed)
SUBJECTIVE: Pt says he feels better today as compared to yesterday, with less leg swelling and diminished shortness of breath. Did not get echo yesterday. Denies chest pain and palpitations. Denies bleeding problems.     Intake/Output Summary (Last 24 hours) at 10/11/13 0903 Last data filed at 10/11/13 4782  Gross per 24 hour  Intake    723 ml  Output   2050 ml  Net  -1327 ml    Current Facility-Administered Medications  Medication Dose Route Frequency Provider Last Rate Last Dose  . 0.9 %  sodium chloride infusion  250 mL Intravenous PRN Dayna N Dunn, PA-C      . acetaminophen (TYLENOL) tablet 650 mg  650 mg Oral Q8H PRN Dayna N Dunn, PA-C      . apixaban (ELIQUIS) tablet 5 mg  5 mg Oral BID Herminio Commons, MD   5 mg at 10/10/13 2108  . furosemide (LASIX) injection 40 mg  40 mg Intravenous Daily Dayna N Dunn, PA-C   40 mg at 10/10/13 9562  . hydrALAZINE (APRESOLINE) injection 10 mg  10 mg Intravenous Q4H PRN Jolaine Artist, MD   10 mg at 10/09/13 2028  . lisinopril (PRINIVIL,ZESTRIL) tablet 20 mg  20 mg Oral Daily Herminio Commons, MD   20 mg at 10/10/13 1308  . nitroGLYCERIN (NITROSTAT) SL tablet 0.4 mg  0.4 mg Sublingual Q5 Min x 3 PRN Dayna N Dunn, PA-C      . potassium chloride SA (K-DUR,KLOR-CON) CR tablet 20 mEq  20 mEq Oral Daily Dayna N Dunn, PA-C   20 mEq at 10/10/13 1213  . sodium chloride 0.9 % injection 3 mL  3 mL Intravenous Q12H Dayna N Dunn, PA-C   3 mL at 10/10/13 2108  . sodium chloride 0.9 % injection 3 mL  3 mL Intravenous PRN Charlie Pitter, PA-C        Filed Vitals:   10/10/13 0654 10/10/13 1434 10/10/13 2100 10/11/13 0518  BP: 159/79 143/69 169/71 139/76  Pulse: 81 65 53 90  Temp: 98.6 F (37 C) 98.3 F (36.8 C) 98 F (36.7 C) 98.1 F (36.7 C)  TempSrc: Oral Oral  Oral  Resp: 18 18 16 16   Height:      Weight:    160 lb 8 oz (72.802 kg)  SpO2: 100% 100% 100% 99%    PHYSICAL EXAM General: NAD  Neck: +JVD (JVP 9-10 cm H2O), no  thyromegaly.  Lungs: Clear to auscultation bilaterally with normal respiratory effort.  CV: Nondisplaced PMI. Irregular rhythm, normal rate, normal S1/S2, no S3, 3/6 apical holosystolic murmur with radiation to axilla. No pretibial edema. No carotid bruit. Normal pedal pulses.  Abdomen: Soft, nontender, no hepatosplenomegaly, no distention.  Neurologic: Alert and oriented x 3.  Psych: Normal affect.  Extremities: No clubbing or cyanosis.    TELEMETRY: Reviewed telemetry pt in atrial flutter with variable conduction and few PVC's. No high ventricular rates.  LABS: Basic Metabolic Panel:  Recent Labs  10/09/13 1407 10/09/13 2240 10/10/13 0340 10/11/13 0550  NA 139  --  141 138  K 3.7  --  3.6* 3.8  CL 104  --  105 103  CO2 24  --  24 25  GLUCOSE 86  --  83 100*  BUN 17  --  16 17  CREATININE 1.20  --  1.16 1.21  CALCIUM 9.4  --  9.0 9.2  MG  --  1.6  --   --  Liver Function Tests:  Recent Labs  10/10/13 0340  AST 24  ALT 16  ALKPHOS 102  BILITOT 0.8  PROT 7.5  ALBUMIN 3.0*   No results found for this basename: LIPASE, AMYLASE,  in the last 72 hours CBC:  Recent Labs  10/09/13 1407 10/10/13 0340  WBC 5.1 4.7  HGB 11.1* 10.4*  HCT 33.3* 31.2*  MCV 90.7 90.7  PLT 208 191   Cardiac Enzymes:  Recent Labs  10/09/13 1955 10/10/13 0051 10/10/13 0818  TROPONINI <0.30 <0.30 <0.30   BNP: No components found with this basename: POCBNP,  D-Dimer:  Recent Labs  10/09/13 1955  DDIMER 0.66*   Hemoglobin A1C:  Recent Labs  10/09/13 2240  HGBA1C 5.8*   Fasting Lipid Panel:  Recent Labs  10/10/13 0340  CHOL 122  HDL 69  LDLCALC 46  TRIG 34  CHOLHDL 1.8   Thyroid Function Tests:  Recent Labs  10/09/13 2240  TSH 0.631   Anemia Panel: No results found for this basename: VITAMINB12, FOLATE, FERRITIN, TIBC, IRON, RETICCTPCT,  in the last 72 hours  RADIOLOGY: Dg Chest 2 View  10/09/2013   CLINICAL DATA:  Right-sided chest pain with  shortness of breath.  EXAM: CHEST  2 VIEW  COMPARISON:  None.  FINDINGS: Trachea may be slightly deviated to the left which can be seen with right thyroid enlargement. Heart is enlarged. Perihilar and basilar predominant interstitial prominence and indistinctness. Biapical pleural parenchymal scarring. Fissural thickening may be due to fluid. Visualized portion of the upper abdomen is unremarkable.  IMPRESSION: Congestive heart failure.   Electronically Signed   By: Lorin Picket M.D.   On: 10/09/2013 18:16      ASSESSMENT AND PLAN: 1. Chest pain-Resolved. Troponins are normal. Although mildly elevated d-dimer, doubt PE. No further workup.  2. Acute on chronic diastolic congestive heart failure-LE swelling and pulmonary vascular congestion has improved considerably with Lasix 40 mg IV daily. I will transition to oral Lasix today. Follow renal function closely. K normal. Awaiting echocardiogram for LV function and to evaluate mitral regurgitation (I spoke with echo tech who confirmed it will be completed today). He was noted to have moderate MR on previous echo. Question if this is contributing to his heart failure.  3. Atrial flutter-the patient is in atrial flutter with a slow ventricular response which could also be contributing to CHF. He also carries a diagnosis of atrial fibrillation. His rate is slow and previous Holter monitor instead to have Mobitz 1 second-degree AV block. Avoid AV nodal blocking agents and follow on telemetry. Apixaban 5 mg by mouth twice a day started 4/18. Proceed with cardioversion 3 weeks later or sooner if he continues to have symptoms despite diuresis. TSH normal. Await echocardiogram.  4. Mitral regurgitation-plan repeat echocardiogram (confirmed with echo tech that it will be completed today).  5. Hypertension-patient's blood pressure better controlled today after increasing lisinopril to 20 mg daily on 4/18.  6. Normocytic anemia-In reviewing his laboratories he had a  mild anemia in May of 2013. This will need further workup with his primary care following discharge. Watch for bleeding and follow hemoglobin.  7. Chest x-ray showed congestive heart failure and slight deviation of the trachea to the left possibly secondary to thyroid enlargement. TSH normal. Check thyroid ultrasound as outpatient.  Dispo: Echo today. Likely discharge on 4/20.  Kate Sable, M.D., F.A.C.C.

## 2013-10-11 NOTE — Progress Notes (Signed)
  Echocardiogram 2D Echocardiogram has been performed.  [place] 10/11/2013, 10:54 AM

## 2013-10-11 NOTE — Plan of Care (Signed)
Problem: Phase II Progression Outcomes Goal: CV Risk Factors identified Outcome: Completed/Met Date Met:  10/11/13 Pt loves salt and and according to family members in room uses quite frequently spoke with pt in regards to foods high in sodium , consequences of high sodium intake doing daily wts. And when to call MD CHF packet given to pt.

## 2013-10-12 ENCOUNTER — Encounter (HOSPITAL_COMMUNITY): Payer: Self-pay | Admitting: Physician Assistant

## 2013-10-12 ENCOUNTER — Other Ambulatory Visit: Payer: 59 | Admitting: Physician Assistant

## 2013-10-12 DIAGNOSIS — R079 Chest pain, unspecified: Secondary | ICD-10-CM

## 2013-10-12 DIAGNOSIS — I441 Atrioventricular block, second degree: Secondary | ICD-10-CM

## 2013-10-12 DIAGNOSIS — I4892 Unspecified atrial flutter: Secondary | ICD-10-CM | POA: Diagnosis not present

## 2013-10-12 MED ORDER — POTASSIUM CHLORIDE CRYS ER 20 MEQ PO TBCR: 20.0000 meq | EXTENDED_RELEASE_TABLET | Freq: Every day | ORAL | Status: DC

## 2013-10-12 MED ORDER — LISINOPRIL 20 MG PO TABS: 20.0000 mg | ORAL_TABLET | Freq: Every day | ORAL | Status: DC

## 2013-10-12 MED ORDER — APIXABAN 5 MG PO TABS: 5.0000 mg | ORAL_TABLET | Freq: Two times a day (BID) | ORAL | Status: DC

## 2013-10-12 NOTE — Progress Notes (Signed)
Patient: Robert Gardner / Admit Date: 10/09/2013 / Date of Encounter: 10/12/2013, 8:20 AM  Subjective  Feels great. No CP or SOB. Has ambulated without difficulty.  Objective   Telemetry: atrial flutter rate controlled. Tele is tagged as NSR but EKG confirms flutter waves this AM.  Physical Exam: Blood pressure 148/75, pulse 64, temperature 98 F (36.7 C), temperature source Oral, resp. rate 18, height 5\' 11"  (1.803 m), weight 160 lb 8 oz (72.802 kg), SpO2 100.00%. General: Well developed, well nourished WM in no acute distress. Head: Normocephalic, atraumatic, sclera non-icteric, no xanthomas, nares are without discharge. Neck: JVP not elevated. Lungs: Clear bilaterally to auscultation without wheezes, rales, or rhonchi. Breathing is unlabored. Heart: Irregular, controlled rate S1 S2, soft SEM. No rubs or gallops.  Abdomen: Soft, non-tender, non-distended with normoactive bowel sounds. No rebound/guarding. Extremities: No clubbing or cyanosis. No edema. Distal pedal pulses are 2+ and equal bilaterally. Neuro: Alert and oriented X 3. Moves all extremities spontaneously. Psych:  Responds to questions appropriately with a normal affect.   Intake/Output Summary (Last 24 hours) at 10/12/13 0820 Last data filed at 10/12/13 0520  Gross per 24 hour  Intake    723 ml  Output   2000 ml  Net  -1277 ml    Inpatient Medications:  . apixaban  5 mg Oral BID  . furosemide  40 mg Oral Daily  . lisinopril  20 mg Oral Daily  . potassium chloride  20 mEq Oral Daily  . sodium chloride  3 mL Intravenous Q12H   Infusions:    Labs:  Recent Labs  10/09/13 1407 10/09/13 2240 10/10/13 0340 10/11/13 0550  NA 139  --  141 138  K 3.7  --  3.6* 3.8  CL 104  --  105 103  CO2 24  --  24 25  GLUCOSE 86  --  83 100*  BUN 17  --  16 17  CREATININE 1.20  --  1.16 1.21  CALCIUM 9.4  --  9.0 9.2  MG  --  1.6  --   --     Recent Labs  10/10/13 0340  AST 24  ALT 16  ALKPHOS 102  BILITOT 0.8    PROT 7.5  ALBUMIN 3.0*    Recent Labs  10/09/13 1407 10/10/13 0340  WBC 5.1 4.7  HGB 11.1* 10.4*  HCT 33.3* 31.2*  MCV 90.7 90.7  PLT 208 191    Recent Labs  10/09/13 1955 10/10/13 0051 10/10/13 0818  TROPONINI <0.30 <0.30 <0.30   No components found with this basename: POCBNP,   Recent Labs  10/09/13 2240  HGBA1C 5.8*     Radiology/Studies:  Dg Chest 2 View  10/09/2013   CLINICAL DATA:  Right-sided chest pain with shortness of breath.  EXAM: CHEST  2 VIEW  COMPARISON:  None.  FINDINGS: Trachea may be slightly deviated to the left which can be seen with right thyroid enlargement. Heart is enlarged. Perihilar and basilar predominant interstitial prominence and indistinctness. Biapical pleural parenchymal scarring. Fissural thickening may be due to fluid. Visualized portion of the upper abdomen is unremarkable.  IMPRESSION: Congestive heart failure.   Electronically Signed   By: Lorin Picket M.D.   On: 10/09/2013 18:16     Assessment and Plan  1. Chest pain, resolved, troponins normal, d-dimer mildly elevated - with echo findings will discuss further workup with MD. Question if cardiomyopathy is related to arrhythmia (EF diminished with global HK but possible WMA). Also, d-dimer was  mildly elevated but it was felt he would not require further workup for PE. Echo showing moderate RV dilatation and elevated PASP (recent trip to [place]). Will d/w MD. He is no longer symptomatic. 2. Acute on chronic diastolic CHF - EF 75% with indeterminate diastolic dysfunction, moderate RV dilatation and PASP 1mmHg with elevated PASP - has diuresed 8 lbs and - 5.3 L. Continue current regimen. Cr remains stable - he likely has some degree of CKD stage ~2. 3. Atrial flutter duration unknown with prior history of AF - rate is controlled and he has h/o Mobitz 1 second-degree AV block. Avoid AV nodal blocking agents and follow on telemetry. Apixaban 5 mg by mouth twice a day started 4/18. Proceed  with cardioversion 3 weeks later or sooner if he continues to have symptoms despite diuresis. Thyroid function normal.  4. Mild-moderate mitral regurgitation - follow. 5. HTN - BP generally controlled. 6. Normocytic anemia without reported bleeding - will need to monitor given that he's new to anticoag. 7. Abnormal CXR with possible thyroid enlargement - TFTs normal. Will need to f/u PCP for thyroid ultrasound.  Signed, Melina Copa PA-C   I have seen and examined the patient along with Melina Copa PA-C.  I have reviewed the chart, notes and new data.  I agree with PA's note.  Key new complaints: feels great Key examination changes: intermittent flutter waves visible in JVP, irregular rhythm Key new findings / data: stable Hgb  PLAN: DC home today Outpatient nuclear stress test to evaluate for ischemic cause of cardiomyopathy. If abnormal, needs coronary angiogram before cardioversion. Cardioversion after 3 weeks of uninterrupted anticoagulation. Has spontaneously controlled ventricular rate and documented second degree AV block Mobitz I on last year's Holter. May need pacemaker after cardioversion, especially if he needs antiarrhythmics. A flutter ablation is another consideration. Reevaluate in clinic before cardioversion and recheck H/H. Out of work until May 18.  Sanda Klein, MD, [place] [phone] 10/12/2013, 10:25 AM

## 2013-10-12 NOTE — Plan of Care (Signed)
Problem: Phase III Progression Outcomes Goal: Sinus rhythm established or heart rate < 100 at rest Outcome: Completed/Met Date Met:  10/12/13 HR  < 100

## 2013-10-12 NOTE — Discharge Summary (Signed)
Discharge Summary   Patient ID: Robert Gardner MRN: [id number], DOB/AGE: Nov 28, 1940 73 y.o. Admit date: 10/09/2013 D/C date:     10/12/2013  Primary Care Provider: Gara Kroner, MD Primary Cardiologist: Robert Gardner  Primary Discharge Diagnoses:  1. Chest pain, somewhat atypical - for outpatient nuclear stress test 2. Newly recognized atrial flutter, duration unknown, rate controlled 3. Acute on chronic combined systolic/diastolic CHF - EF 51% with indeterminate diastolic dysfunction, moderate RV dilatation and PASP 67mmHg with elevated PASP (newly decreased EF) 4. Suspected CKD stage II  5. Prior history of atrial fibrillation 6. Prior history of Mobitz 1 second degree AVB 7. Mild-moderate mitral regurgitation  8. HTN  9. Normocytic anemia without reported bleeding 10. Abnormal CXR with possible thyroid enlargement   Secondary Discharge Diagnoses:  1. H/o Bell's Palsy  2. Erectile dysfunction 3. H/o seizure 10/2011 treated with Keppra - MRI of brain revealed no acute abnormality 11/06/11 - pt denies knowledge of this  Hospital Course: Robert Gardner is a 73 y/o M with history of atrial fibrillation, chronic diastolic CHF, mitral regurgitation and HTN who presented to the ER 10/09/2013 with SOB and chest discomfort. Previous echo in 10/2012 showed low normal LV function, left ventricular hypertrophy, grade 2 diastolic dysfunction, moderate mitral regurgitation. Previous monitor in May of 2014 showed type I second-degree AV block by report. He traveled to [place] last week and noticed increased dyspnea walking longer distances. Several days prior to admission he continued to have increased DOE and mild pedal edema. He denied orthopnea, PND, palpitations or syncope. He also described chest pain like a pounding on the right side of his chest. This was increased with certain movements and inspiration. He had an episode on day of admission with associated vomiting so presented to the ER. He also reported  occasionally missing his medicines. BP was somewhat elevated in the ER but he had not yet taken his medicines for the day. He was also found to be in newly recognized atrial flutter of unknown duration, but was rate controlled. CXR was consistent with CHF. He was admitted for further evaluation and treatment.  He was treated with IV Lasix over the next several days which improved his volume status. Although d-dimer returned mildly elevated at 0.66, further workup for PE was not pursued because all symptoms resolved with diuresis. He had no further chest pain. Troponins remained negative. 2D echocardiogram demonstrated mod LVH, EF 45%, diffuse HK most significant in the mid to basal inferior, inferoseptal, and lateral walls, not technically sufficient to eval LV diastolic function, mild-mod MR, mildly dilated LA, mod dilated RV with mild-mod reduced RV systolic function, CVP [id number], mild TR, mild PR, PA peak pressure 15mmHg (S), 34mmHg (ED), trivial pericardial effusion. His MR will continue to be monitored. The plan is to pursue outpatient nuclear stress to evaluate for ischemia given decreased EF and recent chest pain. Of note he likely has CKD stage II based on historical creatinine levels which remained stable. He diuresed -5.3L and weight was down to 160lb by day of discharge.  With regard to his atrial flutter, the plan is to pursue DCCV in 3 weeks or sooner if symptoms recur despite diuresis. He is not on any AV nodal blocking agents due to spontaneously controlled ventricular rate and documented second degree AV block Mobitz I on last year's Holter. Dr. Sallyanne Gardner feels he may need pacemaker after cardioversion, especially if he needs antiarrhythmics. Aflutter ablation is another consideration. After ruling out, he was started on Eliquis 5mg  BID (  also will receive 30 day free RX for this at discharge along with regular prescription). Aspirin was stopped. He will be plugged into the anticoag clinic for  periodic monitoring. Two issues he was instructed to f/u PCP for include 1) CXR suggested slight tracheal deviation from possible right thyroid enlargement - may need thyroid US (TFTs normal while inpatient) and 2) mild normocytic anemia without any reported bleeding. Dr. Sallyanne Gardner has seen and examined the patient today and feels he is stable for discharge. He works as a Recruitment consultant and Dr. Loletha Gardner has told him not to return to work until 11/09/13.  Discharge Vitals: Blood pressure 148/75, pulse 64, temperature 98 F (36.7 C), temperature source Oral, resp. rate 18, height 5\' 11"  (1.803 m), weight 160 lb 8 oz (72.802 kg), SpO2 100.00%.  Labs: Lab Results  Component Value Date   WBC 4.7 10/10/2013   HGB 10.4* 10/10/2013   HCT 31.2* 10/10/2013   MCV 90.7 10/10/2013   PLT 191 10/10/2013    Recent Labs Lab 10/10/13 0340 10/11/13 0550  NA 141 138  K 3.6* 3.8  CL 105 103  CO2 24 25  BUN 16 17  CREATININE 1.16 1.21  CALCIUM 9.0 9.2  PROT 7.5  --   BILITOT 0.8  --   ALKPHOS 102  --   ALT 16  --   AST 24  --   GLUCOSE 83 100*    Recent Labs  10/09/13 1955 10/10/13 0051 10/10/13 0818  TROPONINI <0.30 <0.30 <0.30   Lab Results  Component Value Date   CHOL 122 10/10/2013   HDL 69 10/10/2013   LDLCALC 46 10/10/2013   TRIG 34 10/10/2013   Lab Results  Component Value Date   DDIMER 0.66* 10/09/2013    Diagnostic Studies/Procedures   Dg Chest 2 View  10/09/2013   CLINICAL DATA:  Right-sided chest pain with shortness of breath.  EXAM: CHEST  2 VIEW  COMPARISON:  None.  FINDINGS: Trachea may be slightly deviated to the left which can be seen with right thyroid enlargement. Heart is enlarged. Perihilar and basilar predominant interstitial prominence and indistinctness. Biapical pleural parenchymal scarring. Fissural thickening may be due to fluid. Visualized portion of the upper abdomen is unremarkable.  IMPRESSION: Congestive heart failure.   Electronically Signed   By: Lorin Picket M.D.   On:  10/09/2013 18:16   2D echo 10/11/13 - Left ventricle: The cavity size was normal. Wall thickness was increased in a pattern of moderate LVH. Systolic function was mildly to moderately reduced. The estimated ejection fraction was 45%. Diffuse hypokinesis, most significant in the mid to basal inferior, inferoseptal, and lateral walls. Global longitudinal strain abnormal at-15%. Speckle tracking measurements provide LVEF ranging 48-52%. The study is not technically sufficient to allow evaluation of LV diastolic function. - Aortic valve: Mildly calcified annulus. Trileaflet; mildly thickened leaflets. - Mitral valve: Calcified annulus. Mildly thickened leaflets . Mild to moderate regurgitation. - Left atrium: The atrium was mildly dilated. - Right ventricle: The cavity size was moderately dilated. Systolic function was mildly to moderately reduced. - Right atrium: The atrium was mildly dilated. Central venous pressure: 48mm Hg (est). - Atrial septum: No defect or patent foramen ovale was identified. - Tricuspid valve: Mild regurgitation. - Pulmonic valve: Mild regurgitation. - Pulmonary arteries: PA peak pressure: 97mm Hg (S). PA pressure: 88mm Hg (ED). - Pericardium, extracardiac: A trivial pericardial effusion was identified. Impressions: - Normal LV chamber size with moderate LVH and LVEF approximately 45%. Indeterminate diastolic  function - suspect at least grade 2 with increased LVEDP. Mild left atrial enlargement. Mild to moderate mitral regurgitation. Moderate RV dilatation with reduced contraction. PASP 40 mmHg with elevated CVP. Trivial pericardial effusion.   Discharge Medications   Current Discharge Medication List    START taking these medications   Details  apixaban (ELIQUIS) 5 MG TABS tablet Take 1 tablet (5 mg total) by mouth 2 (two) times daily. Qty: 60 tablet, Refills: 6    potassium chloride SA (K-DUR,KLOR-CON) 20 MEQ tablet Take 1 tablet (20 mEq total) by  mouth daily. Qty: 30 tablet, Refills: 6      CONTINUE these medications which have CHANGED   Details  lisinopril (PRINIVIL,ZESTRIL) 20 MG tablet Take 1 tablet (20 mg total) by mouth daily. Qty: 30 tablet, Refills: 6      CONTINUE these medications which have NOT CHANGED   Details  acetaminophen (TYLENOL) 650 MG CR tablet Take 650 mg by mouth every 8 (eight) hours as needed for pain.    furosemide (LASIX) 40 MG tablet Take 40 mg by mouth daily.      STOP taking these medications     aspirin EC 81 MG tablet      ibuprofen (ADVIL,MOTRIN) 200 MG tablet      meloxicam (MOBIC) 7.5 MG tablet         Disposition   The patient will be discharged in stable condition to home. Discharge Orders   Future Appointments Provider Department Dept Phone   10/20/2013 7:00 AM [facility] (Nuc Treadm) [place] [phone]   10/22/2013 10:10 AM Liliane Shi, PA-C [place] [facility] [phone]   11/10/2013 10:30 AM Aris [place], [place] [facility] [phone]   11/20/2013 1:00 PM [facility] Echo Echo [facility] [place] [address] [phone]   Future Orders Complete By Expires   Diet - low sodium heart healthy  As directed    Discharge instructions  As directed    Increase activity slowly  As directed    Scheduling Instructions:   Do not return to work until 11/09/13.     Follow-up Information   Follow up with [facility]. (Stress test 10/20/13 at 7am (see last page of Discharge Packet for instructions))    Specialty:  Cardiology   Contact information:   [address] [place] [address] [phone]      Follow up with Richardson Dopp, PA-C. ([facility] - 10/22/13 at 10:10am)    Specialty:  Physician Assistant   Contact information:   [address] [address] [phone]       Follow up with [facility]. (Blood  Thinner Check-In Visit on 11/10/13 at 10:30am (to make sure you are not having any problems on your new blood thinner) )    Specialty:  Cardiology   Contact information:   [address], [place] [address] [phone]        Duration of Discharge Encounter: Greater than 30 minutes including physician and PA time.  Signed, Charlie Pitter PA-C 10/12/2013, 11:28 AM

## 2013-10-12 NOTE — Discharge Instructions (Signed)
You have a Stress Test scheduled at [facility]. Your doctor has ordered this test to get a better idea of how your heart works. ° °Please arrive 15 minutes early for paperwork.  ° °Location: °[address], [address] °[place], [place] [address] °[phone] ° °Instructions: °· No food/drink after midnight the night before.  °· No caffeine/decaf products 24 hours before, including medicines such as Excedrin or Goody Powders. Call if there are any questions.  °· Wear comfortable clothes and shoes.  °· It is OK to take your morning meds with a sip of water EXCEPT for those types of medicines listed below or otherwise instructed. ° °Special Medication Instructions: °· Beta blockers such as metoprolol (Lopressor/Toprol XL), atenolol (Tenormin), carvedilol (Coreg), nebivolol (Bystolic), propranolol (Inderal) should not be taken for 24 hours before the test. °· Calcium channel blockers such as diltiazem (Cardizem) or verapmil (Calan) should not be taken for 24 hours before the test. °· Remove nitroglycerin patches and do not take nitrate preparations such as Imdur/isosorbide the day of your test. °· No Persantine/Theophylline or Aggrenox medicines should be used within 24 hours of the test.  ° °What To Expect: °The whole test will take several hours. When you arrive in the lab, the technician will inject a small amount of radioactive tracer into your arm through an IV while you are resting quietly. This helps us to form pictures of your heart. You will likely only feel a sting from the IV. After a waiting period, resting pictures will be obtained under a big camera. These are the "before" pictures. ° °Next, you will be prepped for the stress portion of the test. This may include either walking on a treadmill or receiving a medicine that helps to dilate blood vessels in your heart to simulate the effect of exercise on your heart. If you are walking on a treadmill, you will walk at different paces to  try to get your heart rate to a goal number that is based on your age. If your doctor has chosen the pharmacologic test, then you will receive a medicine through your IV that may cause temporary nausea, flushing, shortness of breath and sometimes chest discomfort or vomiting. This is typically short-lived and usually resolves quickly. Your blood pressure and heart rate will be monitored, and we will be watching your EKG on a computer screen for any changes. During this portion of the test, the radiologist will inject another small amount of radioactive tracer into your IV. After a waiting period, you will undergo a second set of pictures. These are the "after" pictures. ° °The doctor reading the test will compare the before-and-after images to look for evidence of heart blockages or heart weakness. In certain instances, this test is done over 2 days but usually only takes 1 day to complete. ° °

## 2013-10-20 ENCOUNTER — Encounter: Payer: Self-pay | Admitting: *Deleted

## 2013-10-20 ENCOUNTER — Ambulatory Visit (INDEPENDENT_AMBULATORY_CARE_PROVIDER_SITE_OTHER): Payer: 59 | Admitting: Cardiology

## 2013-10-20 ENCOUNTER — Ambulatory Visit (HOSPITAL_COMMUNITY): Payer: 59 | Attending: Cardiovascular Disease | Admitting: Radiology

## 2013-10-20 ENCOUNTER — Other Ambulatory Visit: Payer: Self-pay | Admitting: Nurse Practitioner

## 2013-10-20 ENCOUNTER — Encounter: Payer: Self-pay | Admitting: Cardiology

## 2013-10-20 VITALS — BP 113/71 | Ht 71.0 in | Wt 157.0 lb

## 2013-10-20 VITALS — BP 160/75 | HR 43 | Ht 71.0 in | Wt 164.0 lb

## 2013-10-20 DIAGNOSIS — Z8249 Family history of ischemic heart disease and other diseases of the circulatory system: Secondary | ICD-10-CM | POA: Insufficient documentation

## 2013-10-20 DIAGNOSIS — R001 Bradycardia, unspecified: Secondary | ICD-10-CM | POA: Insufficient documentation

## 2013-10-20 DIAGNOSIS — R55 Syncope and collapse: Secondary | ICD-10-CM | POA: Insufficient documentation

## 2013-10-20 DIAGNOSIS — R0602 Shortness of breath: Secondary | ICD-10-CM | POA: Insufficient documentation

## 2013-10-20 DIAGNOSIS — I509 Heart failure, unspecified: Secondary | ICD-10-CM

## 2013-10-20 DIAGNOSIS — I420 Dilated cardiomyopathy: Secondary | ICD-10-CM | POA: Insufficient documentation

## 2013-10-20 DIAGNOSIS — I4892 Unspecified atrial flutter: Secondary | ICD-10-CM | POA: Insufficient documentation

## 2013-10-20 DIAGNOSIS — R0609 Other forms of dyspnea: Secondary | ICD-10-CM | POA: Insufficient documentation

## 2013-10-20 DIAGNOSIS — R079 Chest pain, unspecified: Secondary | ICD-10-CM | POA: Insufficient documentation

## 2013-10-20 DIAGNOSIS — I498 Other specified cardiac arrhythmias: Secondary | ICD-10-CM

## 2013-10-20 DIAGNOSIS — I10 Essential (primary) hypertension: Secondary | ICD-10-CM | POA: Insufficient documentation

## 2013-10-20 DIAGNOSIS — I4891 Unspecified atrial fibrillation: Secondary | ICD-10-CM | POA: Insufficient documentation

## 2013-10-20 DIAGNOSIS — R002 Palpitations: Secondary | ICD-10-CM | POA: Insufficient documentation

## 2013-10-20 DIAGNOSIS — R42 Dizziness and giddiness: Secondary | ICD-10-CM | POA: Insufficient documentation

## 2013-10-20 DIAGNOSIS — I428 Other cardiomyopathies: Secondary | ICD-10-CM

## 2013-10-20 DIAGNOSIS — R0989 Other specified symptoms and signs involving the circulatory and respiratory systems: Secondary | ICD-10-CM | POA: Insufficient documentation

## 2013-10-20 LAB — BASIC METABOLIC PANEL
BUN: 21 mg/dL (ref 6–23)
CALCIUM: 9.5 mg/dL (ref 8.4–10.5)
CHLORIDE: 100 meq/L (ref 96–112)
CO2: 29 meq/L (ref 19–32)
Creatinine, Ser: 1.3 mg/dL (ref 0.4–1.5)
GFR: 68.18 mL/min (ref >60.00)
GLUCOSE: 86 mg/dL (ref 70–99)
POTASSIUM: 4.2 meq/L (ref 3.5–5.1)
Sodium: 133 mEq/L — ABNORMAL LOW (ref 135–145)

## 2013-10-20 MED ORDER — POTASSIUM CHLORIDE CRYS ER 20 MEQ PO TBCR: 20.0000 meq | EXTENDED_RELEASE_TABLET | Freq: Every day | ORAL | Status: DC

## 2013-10-20 MED ORDER — TECHNETIUM TC 99M SESTAMIBI GENERIC - CARDIOLITE
11.0000 | Freq: Once | INTRAVENOUS | Status: AC | PRN
  Administered 2013-10-20: 11 via INTRAVENOUS

## 2013-10-20 NOTE — Assessment & Plan Note (Signed)
Continue present blood pressure medications. 

## 2013-10-20 NOTE — Assessment & Plan Note (Addendum)
Patient is noted to be bradycardic today. With exercise his heart rate decreased into the 30s. There was ventricular escape beats as well. He had presyncope. Reviewed with Dr. Lovena Le. He will most likely require biventricular Pacemaker. We will arrange consultation with Dr. Lovena Le. Note he has not had syncope. I've instructed him however not to drive until device is inserted as he has had presyncope.

## 2013-10-20 NOTE — Progress Notes (Signed)
[facility] [address] [place], [place] [address] [phone]    Cardiology Nuclear Med Study  Robert Gardner is a 73 y.o. male     MRN : [id number]     DOB: 1940/07/17  Procedure Date: 12/21/2013  Nuclear Med Background Indication for Stress Test:  Evaluation for Ischemia, ? Pending Clearance for Cardioversion, and Patient seen in hospital on 10-09-2013 for Chest Pain, Atrial Flutter, and CHF, Enzymes negative, Ablation 5/15 History:  AFIB/AFLUTTER, 4/15 ECHO: EF: 45% MR, [place], Hx: 2nd degree AVB Type I  Cardiac Risk Factors: Family History - CAD and Hypertension  Symptoms:  Chest Pain, DOE, Fatigue, Light-Headedness, Near Syncope, Palpitations and SOB   Nuclear Pre-Procedure Caffeine/Decaff Intake:  None > 12 hrs NPO After: 8:00pm   Lungs:  clear O2 Sat: 98% on room air. IV 0.9% NS with Angio Cath:  20g  IV Site: R Wrist x 1, tolerated well IV Started by:  Perrin Maltese, EMT-P  Chest Size (in):  42 Cup Size: n/a  Height: 5\' 11"  (1.803 m)  Weight:  157 lb (71.215 kg)  BMI:  Body mass index is 21.91 kg/(m^2). Tech Comments:  Lisinopril on arrival. Irven Baltimore, Therapist, sports. This patient attempted to walk on the treadmill when his HR started to decrease into the 30's and he became very weak and lt. Headed. B.Crenshaw DOD MD was called in for further evaluation of the patient. The test was cancelled and he was sent over to be seen by Dr. Stanford Breed and Dr. Beckie Salts MD. [facility] EMTP    Nuclear Med Study 1 or 2 day study: 2 day  Stress Test Type:  Stress  Reading MD: N/A  Order Authorizing Provider:  Fransico Him, MD, and Melina Copa, [facility]  Resting Radionuclide: Technetium 5m Sestamibi  Resting Radionuclide Dose: 11.0 mCi on 10/20/13  Stress Radionuclide:  Technetium 22m Sestamibi  Stress Radionuclide Dose: 33.0 mCi on 12/21/13          Stress Protocol Rest HR: 64 Stress HR: 83  Rest BP: 113/71 Stress BP: 122/45  Exercise Time (min): 6:00 METS:  5.90   Predicted Max HR: 149 bpm % Max HR: 55.7 bpm Rate Pressure Product: 10707   Dose of Adenosine (mg):  n/a Dose of Lexiscan: n/a mg  Dose of Atropine (mg): n/a Dose of Dobutamine: n/a mcg/kg/min (at max HR)  Stress Test Technologist: Perrin Maltese, EMT-P  Nuclear Technologist:  Charlton Amor, CNMT     Rest Procedure:  Myocardial perfusion imaging was performed at rest 45 minutes following the intravenous administration of Technetium 67m Sestamibi. Rest ECG: NSR with non-specific ST-T wave changes  Stress Procedure:  The patient received IV Lexiscan 0.4 mg over 4 seconds with concurrent low level exercise and then Technetium 64m Sestamibi was injected at 30 seconds while the patient continued walking one more minute. Quantitative spect images were obtained after a 45 minute delay. Stress ECG: No significant ST segment change suggestive of ischemia.  QPS Raw Data Images:  Normal; no motion artifact; normal heart/lung ratio. Stress Images:  Normal homogeneous uptake in all areas of the myocardium. Rest Images:  Normal homogeneous uptake in all areas of the myocardium. Subtraction (SDS):  No evidence of ischemia. Transient Ischemic Dilatation (Normal <1.22):  0.70 Lung/Heart Ratio (Normal <0.45):  0.32  Quantitative Gated Spect Images QGS EDV:  n/a QGS ESV:  n/a  Impression Exercise Capacity:  Poor exercise capacity. Switched to lexiscan BP Response:  Normal blood pressure response. Clinical  Symptoms:  Mild chest pain/dyspnea. ECG Impression:  No significant ST segment change suggestive of ischemia. Comparison with Prior Nuclear Study: No previous nuclear study performed  Overall Impression:  Low risk stress nuclear study. No ischemia or scar. Develops transient 2nd degree heart block with lexiscan. Study not gated.  LV Ejection Fraction: Study not gated.  LV Wall Motion:  Study not gated  Darlin Coco MD

## 2013-10-20 NOTE — Assessment & Plan Note (Signed)
Euvolemic on examination. Continue present dose of Lasix. Check potassium and renal function. 

## 2013-10-20 NOTE — Assessment & Plan Note (Signed)
Patient's LV function mildly reduced at time of recent admission. Continue ACE inhibitor. Add beta blocker after pacemaker inserted. Eventually will need nuclear study to exclude coronary disease.

## 2013-10-20 NOTE — Assessment & Plan Note (Signed)
Patient remains in atrial flutter.Continue apixaban. Atrial flutter ablation will be considered at time of pacemaker.

## 2013-10-20 NOTE — Patient Instructions (Signed)
Your physician recommends that you schedule a follow-up appointment in: [place] Tuesday 10-27-13 @ 10:30 AM  Your physician recommends that you HAVE LAB WORK TODAY

## 2013-10-20 NOTE — Progress Notes (Signed)
HPI: 73 year old male patient of Dr. Theodosia Blender seen as an add-on for bradycardia. Recently admitted with atrial flutter, diastolic congestive heart failure and hypertension. Previous monitor showed type 1 second degree AV block. Recently admitted with congestive heart failure. Echocardiogram April 2015 showed an ejection fraction of 45%. There was mild to moderate mitral regurgitation. Mild biatrial enlargement. Mild tricuspid regurgitation. Right ventricle was moderately enlarged. Patient was diuresed with improvement in symptoms and scheduled for outpatient nuclear study. There is also plans for cardioversion. In the office today his resting heart rate was in the 40s and with exercise it dropped to the 30s and there was presyncope. I was asked to evaluate. There was note that patient may require thyroid ultrasound and there was a mild normocytic anemia. TSH 0.631. The patient has some dyspnea on exertion and fatigue. No orthopnea, PND or pedal edema. No chest pain. No syncope.  Current Outpatient Prescriptions  Medication Sig Dispense Refill  . acetaminophen (TYLENOL) 650 MG CR tablet Take 650 mg by mouth every 8 (eight) hours as needed for pain.      Marland Kitchen apixaban (ELIQUIS) 5 MG TABS tablet Take 1 tablet (5 mg total) by mouth 2 (two) times daily.  60 tablet  6  . furosemide (LASIX) 40 MG tablet Take 40 mg by mouth daily.      Marland Kitchen lisinopril (PRINIVIL,ZESTRIL) 20 MG tablet Take 1 tablet (20 mg total) by mouth daily.  30 tablet  6  . potassium chloride SA (K-DUR,KLOR-CON) 20 MEQ tablet Take 20 mEq by mouth 2 (two) times daily.       No current facility-administered medications for this visit.     Past Medical History  Diagnosis Date  . Hypertension   . Combined systolic and diastolic heart failure     a. EF low normal 10/2012. b. EF 45% by echo 09/2013.  Marland Kitchen Bell palsy   . Erectile dysfunction   . Atrial fibrillation   . Seizure     a. Hx of seizure 10/2011 treated with Keppra (MRI of brain  revealed no acute abnormality 11/06/11) - pt denies knowlege of this.  . Mobitz (type) I (Wenckebach's) atrioventricular block     a. By Holter 10/2012.  . Mitral regurgitation     a. Mild-mod by echo 09/2013.  Marland Kitchen Atrial flutter     a. Newly recognized 09/2013, rate controlled. Not on any AVN blocking agents due to h/o 2nd degree type 1 heart block and controlled rate.  . CKD (chronic kidney disease), stage II   . Normocytic anemia   . Thyroid enlargement     a. Abnl xray 09/2013 - possible R thyroid enlargement.     Past Surgical History  Procedure Laterality Date  . Resection of melanoma      History   Social History  . Marital Status: Unknown    Spouse Name: N/A    Number of Children: N/A  . Years of Education: N/A   Occupational History  . Not on file.   Social History Main Topics  . Smoking status: Never Smoker   . Smokeless tobacco: Not on file  . Alcohol Use: No  . Drug Use: No  . Sexual Activity: Not on file   Other Topics Concern  . Not on file   Social History Narrative  . No narrative on file    ROS: no fevers or chills, productive cough, hemoptysis, dysphasia, odynophagia, melena, hematochezia, dysuria, hematuria, rash, seizure activity, orthopnea, PND, pedal edema, claudication.  Remaining systems are negative.  Physical Exam: Well-developed well-nourished in no acute distress.  Skin is warm and dry.  HEENT is normal.  Neck is supple.  Chest is clear to auscultation with normal expansion.  Cardiovascular exam is Bradycardic and irregular Abdominal exam nontender or distended. No masses palpated. Extremities show no edema. neuro grossly intact  ECG Atrial flutter at a rate of 48. Nonspecific T-wave changes. With exercise the patient's heart rate decreased into the 30s.

## 2013-10-22 ENCOUNTER — Ambulatory Visit: Payer: 59 | Admitting: Physician Assistant

## 2013-10-27 ENCOUNTER — Encounter: Payer: Self-pay | Admitting: Internal Medicine

## 2013-10-27 ENCOUNTER — Ambulatory Visit (INDEPENDENT_AMBULATORY_CARE_PROVIDER_SITE_OTHER): Payer: 59 | Admitting: Internal Medicine

## 2013-10-27 ENCOUNTER — Encounter: Payer: Self-pay | Admitting: *Deleted

## 2013-10-27 VITALS — BP 158/72 | HR 50 | Ht 71.0 in | Wt 159.0 lb

## 2013-10-27 DIAGNOSIS — I4892 Unspecified atrial flutter: Secondary | ICD-10-CM

## 2013-10-27 DIAGNOSIS — I442 Atrioventricular block, complete: Secondary | ICD-10-CM

## 2013-10-27 DIAGNOSIS — I10 Essential (primary) hypertension: Secondary | ICD-10-CM

## 2013-10-27 LAB — CBC WITH DIFFERENTIAL/PLATELET
BASOS PCT: 0.4 % (ref 0.0–3.0)
Basophils Absolute: 0 10*3/uL (ref 0.0–0.1)
Eosinophils Absolute: 0 10*3/uL (ref 0.0–0.7)
Eosinophils Relative: 0.6 % (ref 0.0–5.0)
HEMATOCRIT: 38.3 % — AB (ref 39.0–52.0)
HEMOGLOBIN: 12.6 g/dL — AB (ref 13.0–17.0)
LYMPHS ABS: 1.8 10*3/uL (ref 0.7–4.0)
Lymphocytes Relative: 37.2 % (ref 12.0–46.0)
MCHC: 32.8 g/dL (ref 30.0–36.0)
MCV: 93.4 fl (ref 78.0–100.0)
MONOS PCT: 12.2 % — AB (ref 3.0–12.0)
Monocytes Absolute: 0.6 10*3/uL (ref 0.1–1.0)
NEUTROS ABS: 2.4 10*3/uL (ref 1.4–7.7)
Neutrophils Relative %: 49.6 % (ref 43.0–77.0)
Platelets: 234 10*3/uL (ref 150.0–400.0)
RBC: 4.1 Mil/uL — AB (ref 4.22–5.81)
RDW: 15.9 % — ABNORMAL HIGH (ref 11.5–15.5)
WBC: 4.8 10*3/uL (ref 4.0–10.5)

## 2013-10-27 LAB — BASIC METABOLIC PANEL
BUN: 24 mg/dL — ABNORMAL HIGH (ref 6–23)
CHLORIDE: 101 meq/L (ref 96–112)
CO2: 30 mEq/L (ref 19–32)
Calcium: 9.9 mg/dL (ref 8.4–10.5)
Creatinine, Ser: 1.6 mg/dL — ABNORMAL HIGH (ref 0.4–1.5)
GFR: 53.91 mL/min — AB (ref >60.00)
Glucose, Bld: 85 mg/dL (ref 70–99)
POTASSIUM: 4.5 meq/L (ref 3.5–5.1)
SODIUM: 136 meq/L (ref 135–145)

## 2013-10-27 NOTE — Assessment & Plan Note (Signed)
The patient would like to get off of anticoagulation and feel better. Cardioversion would be fraught with concern about his underlying ventricular escape. I have discussed catheter ablation with the patient and his wife. The risk/benefits/goals/expectations of the procedure was discussed with the patient and he wishes to proceed.

## 2013-10-27 NOTE — Assessment & Plan Note (Signed)
His blood pressure is elevated despite his bradycardia. I would anticipate initiation of beta blocker therapy once he has undergone ablation and PPM.

## 2013-10-27 NOTE — Patient Instructions (Signed)
Your physician has recommended that you have an ablation. Catheter ablation is a medical procedure used to treat some cardiac arrhythmias (irregular heartbeats). During catheter ablation, a long, thin, flexible tube is put into a blood vessel in your groin (upper thigh), or neck. This tube is called an ablation catheter. It is then guided to your heart through the blood vessel. Radio frequency waves destroy small areas of heart tissue where abnormal heartbeats may cause an arrhythmia to start. Please see the instruction sheet given to you today.   See instruction sheet for procedure 

## 2013-10-27 NOTE — Assessment & Plan Note (Signed)
He has a slow and regular ventricular escape. While he may have preserved conduction in NSR, I suspect he will not and require insertion of a PPM. With his LV dysfunction CHF symptoms and the likely need for 100% pacing, I would recommend a BiV PPM if he in fact has an insufficient escape rhythm once he returns to NSR.

## 2013-10-27 NOTE — Progress Notes (Signed)
HPI Robert Gardner is referred today by Dr. Stanford Breed for evaluation of high grade AV block and atrial flutter. He is a pleasant 73 yo man with a h/o HTN, mitral regurgitation, systolic and diastolic heart failure, and a remote history or seizures. He was found to be bradycardic and in atrial flutter and begun on anti-coagulation with Eliquis. He has class 2 heart failure symptoms. He is still trying to work but has had difficulty with sob. No Known Allergies   Current Outpatient Prescriptions  Medication Sig Dispense Refill  . acetaminophen (TYLENOL) 650 MG CR tablet Take 650 mg by mouth every 8 (eight) hours as needed for pain.      Marland Kitchen apixaban (ELIQUIS) 5 MG TABS tablet Take 1 tablet (5 mg total) by mouth 2 (two) times daily.  60 tablet  6  . furosemide (LASIX) 40 MG tablet Take 40 mg by mouth daily.      Marland Kitchen lisinopril (PRINIVIL,ZESTRIL) 20 MG tablet Take 1 tablet (20 mg total) by mouth daily.  30 tablet  6  . Multiple Vitamin (MULTIVITAMIN) capsule Take 1 capsule by mouth daily.      . potassium chloride SA (K-DUR,KLOR-CON) 20 MEQ tablet Take 20 mEq by mouth 2 (two) times daily.       No current facility-administered medications for this visit.     Past Medical History  Diagnosis Date  . Hypertension   . Combined systolic and diastolic heart failure     a. EF low normal 10/2012. b. EF 45% by echo 09/2013.  Marland Kitchen Bell palsy   . Erectile dysfunction   . Atrial fibrillation   . Seizure     a. Hx of seizure 10/2011 treated with Keppra (MRI of brain revealed no acute abnormality 11/06/11) - pt denies knowlege of this.  . Mobitz (type) I (Wenckebach's) atrioventricular block     a. By Holter 10/2012.  . Mitral regurgitation     a. Mild-mod by echo 09/2013.  Marland Kitchen Atrial flutter     a. Newly recognized 09/2013, rate controlled. Not on any AVN blocking agents due to h/o 2nd degree type 1 heart block and controlled rate.  . CKD (chronic kidney disease), stage II   . Normocytic anemia   . Thyroid  enlargement     a. Abnl xray 09/2013 - possible R thyroid enlargement.     ROS:   All systems reviewed and negative except as noted in the HPI.   Past Surgical History  Procedure Laterality Date  . Resection of melanoma       Family History  Problem Relation Age of Onset  . Heart disease Mother     unclear details  . Heart disease Father     unclear details  . Heart disease Sister     unclear details     History   Social History  . Marital Status: Unknown    Spouse Name: N/A    Number of Children: N/A  . Years of Education: N/A   Occupational History  . Not on file.   Social History Main Topics  . Smoking status: Never Smoker   . Smokeless tobacco: Not on file  . Alcohol Use: No  . Drug Use: No  . Sexual Activity: Not on file   Other Topics Concern  . Not on file   Social History Narrative  . No narrative on file     BP 158/72  Pulse 50  Ht 5\' 11"  (1.803 m)  Wt  159 lb (72.122 kg)  BMI 22.19 kg/m2  Physical Exam:  Well appearing 73 yo man, NAD HEENT: Unremarkable Neck:  7 cm JVD, no thyromegally Back:  No CVA tenderness Lungs:  Clear with no wheezes HEART:  Regular brady rhythm, no murmurs, no rubs, no clicks Abd:  soft, positive bowel sounds, no organomegally, no rebound, no guarding Ext:  2 plus pulses, no edema, no cyanosis, no clubbing Skin:  No rashes no nodules Neuro:  CN II through XII intact, motor grossly intact  EKG - atrial flutter with a slow regular ventricular response  Assess/Plan:

## 2013-11-04 ENCOUNTER — Encounter (HOSPITAL_COMMUNITY): Payer: Self-pay | Admitting: Pharmacy Technician

## 2013-11-10 ENCOUNTER — Encounter: Payer: 59 | Admitting: Pharmacist

## 2013-11-10 MED ORDER — SODIUM CHLORIDE 0.9 % IV SOLN
INTRAVENOUS | Status: DC
  Administered 2013-11-11: 07:00:00 via INTRAVENOUS

## 2013-11-10 MED ORDER — CHLORHEXIDINE GLUCONATE 4 % EX LIQD
60.0000 mL | Freq: Once | CUTANEOUS | Status: DC
  Filled 2013-11-10: qty 60

## 2013-11-10 MED ORDER — GENTAMICIN SULFATE 40 MG/ML IJ SOLN
80.0000 mg | INTRAMUSCULAR | Status: DC
  Filled 2013-11-10: qty 2

## 2013-11-10 MED ORDER — CEFAZOLIN SODIUM-DEXTROSE 2-3 GM-% IV SOLR
2.0000 g | Freq: Once | INTRAVENOUS | Status: DC
  Filled 2013-11-10: qty 50

## 2013-11-11 ENCOUNTER — Ambulatory Visit (HOSPITAL_COMMUNITY)
Admission: RE | Admit: 2013-11-11 | Discharge: 2013-11-12 | Disposition: A | Discharge status: Home / Self Care | Payer: 59 | Source: Ambulatory Visit | Attending: Internal Medicine | Admitting: Internal Medicine

## 2013-11-11 ENCOUNTER — Encounter (HOSPITAL_COMMUNITY)
Admission: RE | Disposition: A | Discharge status: Home / Self Care | Payer: Self-pay | Source: Ambulatory Visit | Attending: Internal Medicine

## 2013-11-11 ENCOUNTER — Encounter (HOSPITAL_COMMUNITY): Payer: Self-pay

## 2013-11-11 ENCOUNTER — Other Ambulatory Visit: Payer: Self-pay

## 2013-11-11 DIAGNOSIS — N182 Chronic kidney disease, stage 2 (mild): Secondary | ICD-10-CM | POA: Insufficient documentation

## 2013-11-11 DIAGNOSIS — I129 Hypertensive chronic kidney disease with stage 1 through stage 4 chronic kidney disease, or unspecified chronic kidney disease: Secondary | ICD-10-CM | POA: Insufficient documentation

## 2013-11-11 DIAGNOSIS — I509 Heart failure, unspecified: Secondary | ICD-10-CM | POA: Insufficient documentation

## 2013-11-11 DIAGNOSIS — I4892 Unspecified atrial flutter: Secondary | ICD-10-CM

## 2013-11-11 DIAGNOSIS — I428 Other cardiomyopathies: Secondary | ICD-10-CM | POA: Insufficient documentation

## 2013-11-11 DIAGNOSIS — I059 Rheumatic mitral valve disease, unspecified: Secondary | ICD-10-CM | POA: Insufficient documentation

## 2013-11-11 DIAGNOSIS — D649 Anemia, unspecified: Secondary | ICD-10-CM | POA: Insufficient documentation

## 2013-11-11 DIAGNOSIS — I441 Atrioventricular block, second degree: Secondary | ICD-10-CM | POA: Insufficient documentation

## 2013-11-11 DIAGNOSIS — I5042 Chronic combined systolic (congestive) and diastolic (congestive) heart failure: Secondary | ICD-10-CM | POA: Insufficient documentation

## 2013-11-11 DIAGNOSIS — Z7901 Long term (current) use of anticoagulants: Secondary | ICD-10-CM | POA: Insufficient documentation

## 2013-11-11 HISTORY — PX: ATRIAL FLUTTER ABLATION: SHX5733

## 2013-11-11 HISTORY — PX: ABLATION: SHX5711

## 2013-11-11 LAB — SURGICAL PCR SCREEN
MRSA, PCR: POSITIVE — AB
STAPHYLOCOCCUS AUREUS: POSITIVE — AB

## 2013-11-11 SURGERY — ATRIAL FLUTTER ABLATION
Anesthesia: LOCAL

## 2013-11-11 MED ORDER — FENTANYL CITRATE 0.05 MG/ML IJ SOLN
INTRAMUSCULAR | Status: AC
  Filled 2013-11-11: qty 2

## 2013-11-11 MED ORDER — MIDAZOLAM HCL 5 MG/5ML IJ SOLN
INTRAMUSCULAR | Status: AC
  Filled 2013-11-11: qty 5

## 2013-11-11 MED ORDER — FUROSEMIDE 40 MG PO TABS
40.0000 mg | ORAL_TABLET | Freq: Every day | ORAL | Status: DC
  Administered 2013-11-11 – 2013-11-12 (×2): 40 mg via ORAL
  Filled 2013-11-11 (×2): qty 1

## 2013-11-11 MED ORDER — MUPIROCIN 2 % EX OINT
TOPICAL_OINTMENT | Freq: Two times a day (BID) | CUTANEOUS | Status: DC
  Administered 2013-11-11: 07:00:00 via NASAL
  Filled 2013-11-11 (×2): qty 22

## 2013-11-11 MED ORDER — APIXABAN 5 MG PO TABS
5.0000 mg | ORAL_TABLET | Freq: Two times a day (BID) | ORAL | Status: DC
  Administered 2013-11-11 – 2013-11-12 (×2): 5 mg via ORAL
  Filled 2013-11-11 (×3): qty 1

## 2013-11-11 MED ORDER — MUPIROCIN 2 % EX OINT
1.0000 "application " | TOPICAL_OINTMENT | Freq: Two times a day (BID) | CUTANEOUS | Status: DC
  Administered 2013-11-11 – 2013-11-12 (×2): 1 via NASAL
  Filled 2013-11-11: qty 22

## 2013-11-11 MED ORDER — SODIUM CHLORIDE 0.9 % IJ SOLN
3.0000 mL | Freq: Two times a day (BID) | INTRAMUSCULAR | Status: DC
  Administered 2013-11-12: 3 mL via INTRAVENOUS

## 2013-11-11 MED ORDER — HEPARIN (PORCINE) IN NACL 2-0.9 UNIT/ML-% IJ SOLN
INTRAMUSCULAR | Status: AC
  Filled 2013-11-11: qty 1000

## 2013-11-11 MED ORDER — SODIUM CHLORIDE 0.9 % IV SOLN: 250.0000 mL | INTRAVENOUS | Status: DC | PRN

## 2013-11-11 MED ORDER — LIDOCAINE HCL (PF) 1 % IJ SOLN
INTRAMUSCULAR | Status: AC
  Filled 2013-11-11: qty 60

## 2013-11-11 MED ORDER — LISINOPRIL 20 MG PO TABS
20.0000 mg | ORAL_TABLET | Freq: Every day | ORAL | Status: DC
  Administered 2013-11-12: 20 mg via ORAL
  Filled 2013-11-11: qty 1

## 2013-11-11 MED ORDER — BUPIVACAINE HCL (PF) 0.25 % IJ SOLN
INTRAMUSCULAR | Status: AC
  Filled 2013-11-11: qty 60

## 2013-11-11 MED ORDER — CHLORHEXIDINE GLUCONATE CLOTH 2 % EX PADS
6.0000 | MEDICATED_PAD | Freq: Every day | CUTANEOUS | Status: DC
  Administered 2013-11-12: 6 via TOPICAL

## 2013-11-11 MED ORDER — SODIUM CHLORIDE 0.9 % IJ SOLN: 3.0000 mL | INTRAMUSCULAR | Status: DC | PRN

## 2013-11-11 NOTE — H&P (View-Only) (Signed)
HPI Robert Gardner is referred today by Dr. Stanford Breed for evaluation of high grade AV block and atrial flutter. He is a pleasant 73 yo man with a h/o HTN, mitral regurgitation, systolic and diastolic heart failure, and a remote history or seizures. He was found to be bradycardic and in atrial flutter and begun on anti-coagulation with Eliquis. He has class 2 heart failure symptoms. He is still trying to work but has had difficulty with sob. No Known Allergies   Current Outpatient Prescriptions  Medication Sig Dispense Refill  . acetaminophen (TYLENOL) 650 MG CR tablet Take 650 mg by mouth every 8 (eight) hours as needed for pain.      Marland Kitchen apixaban (ELIQUIS) 5 MG TABS tablet Take 1 tablet (5 mg total) by mouth 2 (two) times daily.  60 tablet  6  . furosemide (LASIX) 40 MG tablet Take 40 mg by mouth daily.      Marland Kitchen lisinopril (PRINIVIL,ZESTRIL) 20 MG tablet Take 1 tablet (20 mg total) by mouth daily.  30 tablet  6  . Multiple Vitamin (MULTIVITAMIN) capsule Take 1 capsule by mouth daily.      . potassium chloride SA (K-DUR,KLOR-CON) 20 MEQ tablet Take 20 mEq by mouth 2 (two) times daily.       No current facility-administered medications for this visit.     Past Medical History  Diagnosis Date  . Hypertension   . Combined systolic and diastolic heart failure     a. EF low normal 10/2012. b. EF 45% by echo 09/2013.  Marland Kitchen Bell palsy   . Erectile dysfunction   . Atrial fibrillation   . Seizure     a. Hx of seizure 10/2011 treated with Keppra (MRI of brain revealed no acute abnormality 11/06/11) - pt denies knowlege of this.  . Mobitz (type) I (Wenckebach's) atrioventricular block     a. By Holter 10/2012.  . Mitral regurgitation     a. Mild-mod by echo 09/2013.  Marland Kitchen Atrial flutter     a. Newly recognized 09/2013, rate controlled. Not on any AVN blocking agents due to h/o 2nd degree type 1 heart block and controlled rate.  . CKD (chronic kidney disease), stage II   . Normocytic anemia   . Thyroid  enlargement     a. Abnl xray 09/2013 - possible R thyroid enlargement.     ROS:   All systems reviewed and negative except as noted in the HPI.   Past Surgical History  Procedure Laterality Date  . Resection of melanoma       Family History  Problem Relation Age of Onset  . Heart disease Mother     unclear details  . Heart disease Father     unclear details  . Heart disease Sister     unclear details     History   Social History  . Marital Status: Unknown    Spouse Name: N/A    Number of Children: N/A  . Years of Education: N/A   Occupational History  . Not on file.   Social History Main Topics  . Smoking status: Never Smoker   . Smokeless tobacco: Not on file  . Alcohol Use: No  . Drug Use: No  . Sexual Activity: Not on file   Other Topics Concern  . Not on file   Social History Narrative  . No narrative on file     BP 158/72  Pulse 50  Ht 5\' 11"  (1.803 m)  Wt  159 lb (72.122 kg)  BMI 22.19 kg/m2  Physical Exam:  Well appearing 73 yo man, NAD HEENT: Unremarkable Neck:  7 cm JVD, no thyromegally Back:  No CVA tenderness Lungs:  Clear with no wheezes HEART:  Regular brady rhythm, no murmurs, no rubs, no clicks Abd:  soft, positive bowel sounds, no organomegally, no rebound, no guarding Ext:  2 plus pulses, no edema, no cyanosis, no clubbing Skin:  No rashes no nodules Neuro:  CN II through XII intact, motor grossly intact  EKG - atrial flutter with a slow regular ventricular response  Assess/Plan:

## 2013-11-11 NOTE — CV Procedure (Signed)
Electrophysiology procedure note  Procedure: Electrophysiologic study and catheter ablation of atrial flutter  Indication: Symptomatic atrial flutter  Description of procedure: After informed consent was obtained, the patient was taken to the diagnostic electrophysiology laboratory in the fasting state. After the usual preparation and draping, intravenous Versed and fentanyl were given for sedation. A 6 French quadripolar catheter was inserted percutaneously into the right femoral vein and advanced to the His bundle region. A 7 French hexapolar catheter was inserted percutaneously into the right jugular vein and advanced to the coronary sinus. Mapping demonstrated typical atrial flutter. The cycle length was 240 ms. A 7 French quadripolar ablation catheter, was inserted percutaneously into the right femoral vein and advanced to the right atrium were mapping was carried out. Mapping of the atrial flutter isthmus demonstrated typical counterclockwise tricuspid annular reentry. A series of 9 radiofrequency energy applications were delivered. During the second radiofrequency energy application, sinus rhythm was restored. During the seventh radiofrequency energy application, atrial flutter isthmus block was demonstrated. 2 additional radiofrequency energy applications were then delivered, and the patient was observed for 20 minutes. During this time, rapid ventricular pacing at 600 ms was carried out which demonstrated VA block. Program ventricular stimulation was carried out at 600 ms, demonstrating VA block. Rapid atrial pacing was carried out at 600 ms demonstrating 2-1 AV conduction. Programmed atrial stimulation was carried out at 600 ms demonstrating 2-1 AV conduction. Prior to the development of atrial flutter, the patient had no AV Wencheback block. The patient had an HV interval of 60 ms. Because his conduction did not worsen with more rapid atrial pacing, it was deemed most appropriate to observe the  patient overnight. He may ultimately require permanent pacemaker insertion, but there is no clear-cut indication to perform this procedure at the time of his ablation today. At this point the catheters were removed and the patient was returned to the recovery area to assure hemostasis.  Complications: There were no immediate procedural complications.  Results. 1. Baseline ECG. The baseline ECG demonstrated atrial flutter with a slow ventricular response. 2. Baseline intervals. The atrial flutter cycle length was 240 ms. The HV interval was 60 ms. The QRS duration was 100 ms. Following catheter ablation, the patient had 3-2 and 2-1 AV block. The QRS remained narrow 3. Rapid ventricular pacing. Following ablation, rapid ventricular pacing was carried out from the right ventricle demonstrating VA block at 600 ms. 4. Program ventricular stimulation. Program ventricular stimulation was carried out following ablation demonstrating VA block at 600 ms. 5. Programmed atrial stimulation. Programmed atrial stimulation was carried out following ablation demonstrating AV block at 600 ms. 6. Rapid atrial pacing rapid atrial pacing was carried out following ablation demonstrating AV block at greater than 600 ms. 7. Radiofrequency energy application a total of 9 radiofrequency energy applications were delivered. During the second radiofrequency energy application, atrial flutter was terminated in sinus rhythm restored. During the seventh radiofrequency energy application, atrial flutter isthmus block was demonstrated. 2 additional radio frequency energy applications with the delivered. 8. Arrhythmias observed. Atrial flutter. Cycle length 240 ms, methods of initiation, present at the time of EP study. At the termination, catheter ablation 9. Mapping. Mapping of atrial flutter demonstrated typical counterclockwise tricuspid annular reentry atrial flutter.  Conclusion. Successful radiofrequency catheter ablation of  typical atrial flutter. The patient has persistence of underlying AV conduction system disease, but had no absolute indication for permanent pacemaker insertion at this time.  Cristopher Peru, M.D.

## 2013-11-11 NOTE — Interval H&P Note (Signed)
History and Physical Interval Note:  11/11/2013 8:36 AM  Robert Gardner  has presented today for surgery, with the diagnosis of aflutter/cm  The various methods of treatment have been discussed with the patient and family. After consideration of risks, benefits and other options for treatment, the patient has consented to  Procedure(s): ATRIAL FLUTTER ABLATION (N/A) and possible BiV PPM insertion if his ablation reveals underlying complete heart block in sinus rhythm as a surgical intervention .  The patient's history has been reviewed, patient examined, no change in status, stable for surgery.  I have reviewed the patient's chart and labs.  Questions were answered to the patient's satisfaction.     Norlene Duel.D.

## 2013-11-11 NOTE — Progress Notes (Signed)
[id number] Dr Lovena Le advised labs are 55 days old.  Will not need to repeat per MD's instruciton

## 2013-11-12 ENCOUNTER — Encounter (HOSPITAL_COMMUNITY): Payer: Self-pay | Admitting: *Deleted

## 2013-11-12 ENCOUNTER — Encounter: Payer: Self-pay | Admitting: *Deleted

## 2013-11-12 MED ORDER — MUPIROCIN 2 % EX OINT: 1.0000 "application " | TOPICAL_OINTMENT | Freq: Two times a day (BID) | CUTANEOUS | Status: DC

## 2013-11-12 MED ORDER — MUPIROCIN 2 % EX OINT: 1.0000 "application " | TOPICAL_OINTMENT | Freq: Two times a day (BID) | CUTANEOUS | Status: AC

## 2013-11-12 NOTE — Discharge Instructions (Signed)
No driving for 2 days. No lifting over 5 lbs for 1 week. No sexual activity for 1 week. You may return to work on May 26th. Keep procedure site clean & dry. If you notice increased pain, swelling, bleeding or pus, call/return!  You may shower, but no soaking baths/hot tubs/pools for 1 week.

## 2013-11-12 NOTE — Discharge Summary (Signed)
ELECTROPHYSIOLOGY PROCEDURE DISCHARGE SUMMARY    Patient ID: Robert Gardner,  MRN: [id number], DOB/AGE: [birth year] 73 y.o.  Admit date: 11/11/2013 Discharge date: 11/13/2013  Primary Care Physician: Gara Kroner, MD Primary Cardiologist: Radford Pax Electrophysiologist: Lovena Le  Primary Discharge Diagnosis:  Atrial flutter status post ablation this admission  Secondary Discharge Diagnosis:  1.  Mobitz I heart block 2.  Moderate mitral regurgitation 3.  CKD 4.  Normocytic anemia 5.  Dilated cardiomyopathy 6.  CHF  No Known Allergies   Procedures This Admission:  1.  Electrophysiology study and radiofrequency catheter ablation on 11-11-2013 by Dr Lovena Le.  This study demonstrated successful ablation of atrial flutter.  There is persistent underlying AV conduction disease but no absolute indication for pacing at this time.   Brief HPI: Robert Gardner is a 73 y.o. male with a past medical history as outlined above.  He was found to have atrial flutter with a history of Mobitz I heart block and was referred for ablation.  Risks, benefits, and alternatives to the procedure were reviewed with the patient who wished to proceed. There was concern about heart block post procedure and CRTP was recommended if this were to be the case.   Hospital Course:  The patient was admitted and underwent EPS/RFCA of atrial flutter with details as outlined above.  Post procedure, the patient had Mobitz I heart block.  He was monitored on telemetry overnight which demonstrated persistent Mobitz I.  The patient ambulated without difficulty and was able to increase heart rates.  He has not had any symptoms of dizziness or pre-syncope.  Groin and neck incisions were without complication.  He will have early follow up with Ileene Hutchinson, PA in the office to re-evaluate heart rates.  He was examined by Dr Lovena Le and considered stable for discharge to home.   Discharge Vitals: Blood pressure 136/65, pulse 57,  temperature 98 F (36.7 C), temperature source Oral, resp. rate 20, height 5\' 11"  (1.803 m), weight 150 lb (68.04 kg), SpO2 100.00%.    Labs:   Lab Results  Component Value Date   WBC 4.8 10/27/2013   HGB 12.6* 10/27/2013   HCT 38.3* 10/27/2013   MCV 93.4 10/27/2013   PLT 234.0 10/27/2013   No results found for this basename: NA, K, CL, CO2, BUN, CREATININE, CALCIUM, LABALBU, PROT, BILITOT, ALKPHOS, ALT, AST, GLUCOSE,  in the last 168 hours   Discharge Medications:    Medication List         acetaminophen 650 MG CR tablet  Commonly known as:  TYLENOL  Take 650 mg by mouth every 8 (eight) hours as needed for pain.     apixaban 5 MG Tabs tablet  Commonly known as:  ELIQUIS  Take 1 tablet (5 mg total) by mouth 2 (two) times daily.     furosemide 40 MG tablet  Commonly known as:  LASIX  Take 40 mg by mouth daily.     lisinopril 20 MG tablet  Commonly known as:  PRINIVIL,ZESTRIL  Take 1 tablet (20 mg total) by mouth daily.     multivitamin capsule  Take 1 capsule by mouth daily.     mupirocin ointment 2 %  Commonly known as:  BACTROBAN  Place 1 application into the nose 2 (two) times daily.     potassium chloride SA 20 MEQ tablet  Commonly known as:  K-DUR,KLOR-CON  Take 20 mEq by mouth daily.        Disposition:   Follow-up Information  Follow up with [facility] On 11/26/2013. (Follow up on 11/26/2013 9am)    Specialty:  Cardiology   Contact information:   [address], [place] [address] [phone]      Follow up with Sueanne Margarita, MD On 12/21/2013. (Follow up with Dr. Alfonzo Feller on 6/29 at 10:15am)    Specialty:  Cardiology   Contact information:   [address] [address] [phone]       Duration of Discharge Encounter: Greater than 30 minutes including physician time.  Signed,

## 2013-11-20 ENCOUNTER — Other Ambulatory Visit (HOSPITAL_COMMUNITY): Payer: 59

## 2013-11-26 ENCOUNTER — Encounter: Payer: 59 | Admitting: Cardiology

## 2013-12-10 ENCOUNTER — Encounter (HOSPITAL_COMMUNITY): Payer: 59

## 2013-12-21 ENCOUNTER — Ambulatory Visit (HOSPITAL_COMMUNITY): Payer: 59 | Attending: Cardiovascular Disease | Admitting: Radiology

## 2013-12-21 ENCOUNTER — Encounter: Payer: Self-pay | Admitting: Cardiology

## 2013-12-21 ENCOUNTER — Ambulatory Visit (INDEPENDENT_AMBULATORY_CARE_PROVIDER_SITE_OTHER): Payer: 59 | Admitting: Cardiology

## 2013-12-21 VITALS — BP 98/44 | HR 66 | Ht 71.0 in | Wt 157.0 lb

## 2013-12-21 DIAGNOSIS — I5032 Chronic diastolic (congestive) heart failure: Secondary | ICD-10-CM

## 2013-12-21 DIAGNOSIS — I4892 Unspecified atrial flutter: Secondary | ICD-10-CM | POA: Insufficient documentation

## 2013-12-21 DIAGNOSIS — I509 Heart failure, unspecified: Secondary | ICD-10-CM

## 2013-12-21 DIAGNOSIS — I5042 Chronic combined systolic (congestive) and diastolic (congestive) heart failure: Secondary | ICD-10-CM | POA: Insufficient documentation

## 2013-12-21 DIAGNOSIS — I10 Essential (primary) hypertension: Secondary | ICD-10-CM

## 2013-12-21 DIAGNOSIS — I483 Typical atrial flutter: Secondary | ICD-10-CM

## 2013-12-21 DIAGNOSIS — I4891 Unspecified atrial fibrillation: Secondary | ICD-10-CM

## 2013-12-21 LAB — BASIC METABOLIC PANEL
BUN: 24 mg/dL — ABNORMAL HIGH (ref 6–23)
CO2: 29 meq/L (ref 19–32)
CREATININE: 1.7 mg/dL — AB (ref 0.4–1.5)
Calcium: 9.8 mg/dL (ref 8.4–10.5)
Chloride: 101 mEq/L (ref 96–112)
GFR: 51.34 mL/min — ABNORMAL LOW (ref >60.00)
GLUCOSE: 90 mg/dL (ref 70–99)
Potassium: 4.3 mEq/L (ref 3.5–5.1)
Sodium: 136 mEq/L (ref 135–145)

## 2013-12-21 MED ORDER — TECHNETIUM TC 99M SESTAMIBI GENERIC - CARDIOLITE
33.0000 | Freq: Once | INTRAVENOUS | Status: AC | PRN
  Administered 2013-12-21: 33 via INTRAVENOUS

## 2013-12-21 MED ORDER — TECHNETIUM TC 99M SESTAMIBI GENERIC - CARDIOLITE
11.0000 | Freq: Once | INTRAVENOUS | Status: AC | PRN
  Administered 2013-12-21: 11 via INTRAVENOUS

## 2013-12-21 MED ORDER — REGADENOSON 0.4 MG/5ML IV SOLN
0.4000 mg | Freq: Once | INTRAVENOUS | Status: AC
  Administered 2013-12-21: 0.4 mg via INTRAVENOUS

## 2013-12-21 NOTE — Patient Instructions (Signed)
Your physician recommends that you continue on your current medications as directed. Please refer to the Current Medication list given to you today.  Your physician recommends that you go to the lab today for a BMET  Your physician wants you to follow-up in: 6 months with Dr Turner You will receive a reminder letter in the mail two months in advance. If you don't receive a letter, please call our office to schedule the follow-up appointment.  

## 2013-12-21 NOTE — Progress Notes (Signed)
[place], [place] [place], [place]  [address] Phone: [phone] Fax:  [phone]  Date:  12/21/2013   ID:  Robert Gardner, DOB 27-Feb-1941, MRN [id number]  PCP:  Gara Kroner, MD  Cardiologist:  Fransico Him, MD     History of Present Illness: This is a 73 year old male with past medical history of atrial fibrillation, diastolic congestive heart failure, moderate mitral regurgitation, hypertension who presented to [facility] on 4/17 for evaluation of congestive heart failure, chest pain and atrial flutter. Patient had an echocardiogram in may of 2014 that showed low normal LV function, left ventricular hypertrophy, grade 2 diastolic dysfunction, moderate mitral regurgitation. Previous monitor in May of 2014 showed type I second-degree AV block by report.   He had traveled to [place] the week before and noticed more dyspnea with walking longer distances.  He noticed mild pedal edema. He denied palpitations or syncope. He also described chest pain. The pain was described as a pounding on the right side of his chest. Increased with certain movements and inspiration. He had a severe episode on day of EF visit associated with vomiting. He was found to have new onset atrial flutter.  He was treated with IV Lasix for volume overload.  He had no further CP and rule out for MI.  Echo showed mild LV dysfunction with EF 45% with HK in the mid to basal inferior/inferoseptal and lateral walls, mild to mod MR, moderately dilated RV with mild to mod reduced RVF and mild pulmonary HTN PASP 25mmHg.  He was started on Eliquis and his ASA was stopped.  He was seen by Dr. Lovena Le and underwent aflutter ablation.  He was also set up for outpt nuclear stress test which was just done this am.  He says he has not had any further CP.  Rarely he will have some SOB.  He denies any LE edema, palpitations or dizziness.     Wt Readings from Last 3 Encounters:  12/21/13 157 lb (71.215 kg)  11/11/13 150 lb (68.04 kg)  11/11/13  150 lb (68.04 kg)     Past Medical History  Diagnosis Date  . Hypertension   . Combined systolic and diastolic heart failure     a. EF low normal 10/2012. b. EF 45% by echo 09/2013.  Marland Kitchen Bell palsy   . Erectile dysfunction   . Seizure     a. Hx of seizure 10/2011 treated with Keppra (MRI of brain revealed no acute abnormality 11/06/11) - pt denies knowlege of this.  . Mobitz (type) I (Wenckebach's) atrioventricular block     a. By Holter 10/2012.  . Mitral regurgitation     a. Mild-mod by echo 09/2013.  Marland Kitchen Atrial flutter     a. Newly recognized 09/2013, rate controlled. Not on any AVN blocking agents due to h/o 2nd degree type 1 heart block and controlled rate.  . CKD (chronic kidney disease), stage II   . Normocytic anemia   . Thyroid enlargement     a. Abnl xray 09/2013 - possible R thyroid enlargement.     Current Outpatient Prescriptions  Medication Sig Dispense Refill  . acetaminophen (TYLENOL) 650 MG CR tablet Take 650 mg by mouth every 8 (eight) hours as needed for pain.      Marland Kitchen apixaban (ELIQUIS) 5 MG TABS tablet Take 1 tablet (5 mg total) by mouth 2 (two) times daily.  60 tablet  6  . furosemide (LASIX) 40 MG tablet Take 40 mg by mouth daily.      Marland Kitchen  lisinopril (PRINIVIL,ZESTRIL) 20 MG tablet Take 1 tablet (20 mg total) by mouth daily.  30 tablet  6  . Multiple Vitamin (MULTIVITAMIN) capsule Take 1 capsule by mouth daily.      . potassium chloride SA (K-DUR,KLOR-CON) 20 MEQ tablet Take 20 mEq by mouth daily.        No current facility-administered medications for this visit.    Allergies:   No Known Allergies  Social History:  The patient  reports that he quit smoking about 20 years ago. He has never used smokeless tobacco. He reports that he does not drink alcohol or use illicit drugs.   Family History:  The patient's family history includes Heart disease in his father, mother, and sister.   ROS:  Please see the history of present illness.      All other systems reviewed and  negative.   PHYSICAL EXAM: VS:  BP 98/44  Pulse 66  Ht 5\' 11"  (1.803 m)  Wt 157 lb (71.215 kg)  BMI 21.91 kg/m2 Well nourished, well developed, in no acute distress HEENT: normal Neck: no JVD Cardiac:  normal S1, S2; RRR; no murmur Lungs:  clear to auscultation bilaterally, no wheezing, rhonchi or rales Abd: soft, nontender, no hepatomegaly Ext: no edema Skin: warm and dry Neuro:  CNs 2-12 intact, no focal abnormalities noted  EKG:  NSR with type I second degree AV block     ASSESSMENT AND PLAN:  1. Atrial flutter s/p recent flutter ablation - continue Eliquis 2. Type I second degree AV block on EKG - asyptomatic 3. Chest pain - preliminary images on nuclear stress test appear to show no ischemia 4. Mild LV dysfunction EF 45% by echo - not gated on nuclear due to rhythm irregularity 5. Chronic combined systolic/diastolic CHF - appears euvolemic on exam - continue Lasix/ACE I - check BMET 6. HTN with borderline low BP but just had Lexiscan myoview done 7. Mild to moderate MR 8. Mild pulmonary HTN PASP 84mmHg by echo 09/2013  Followup with me in 6 months  Signed, Fransico Him, MD 12/21/2013 10:33 AM

## 2013-12-24 ENCOUNTER — Telehealth: Payer: Self-pay | Admitting: Cardiology

## 2013-12-24 DIAGNOSIS — Z79899 Other long term (current) drug therapy: Secondary | ICD-10-CM

## 2013-12-24 NOTE — Telephone Encounter (Signed)
New message    Wife calling stating nurse call the home today returning call back

## 2013-12-24 NOTE — Telephone Encounter (Signed)
D/c meds and put order in for BMET. Pt will call next week to let us know when he can come in for labs in between his job.

## 2014-01-07 ENCOUNTER — Other Ambulatory Visit (INDEPENDENT_AMBULATORY_CARE_PROVIDER_SITE_OTHER): Payer: 59

## 2014-01-07 DIAGNOSIS — Z79899 Other long term (current) drug therapy: Secondary | ICD-10-CM

## 2014-01-07 LAB — BASIC METABOLIC PANEL
BUN: 33 mg/dL — AB (ref 6–23)
CHLORIDE: 100 meq/L (ref 96–112)
CO2: 27 mEq/L (ref 19–32)
CREATININE: 2 mg/dL — AB (ref 0.4–1.5)
Calcium: 9.6 mg/dL (ref 8.4–10.5)
GFR: 42.8 mL/min — ABNORMAL LOW (ref >60.00)
Glucose, Bld: 89 mg/dL (ref 70–99)
Potassium: 4.3 mEq/L (ref 3.5–5.1)
Sodium: 134 mEq/L — ABNORMAL LOW (ref 135–145)

## 2014-01-08 ENCOUNTER — Other Ambulatory Visit: Payer: Self-pay

## 2014-01-08 DIAGNOSIS — I428 Other cardiomyopathies: Secondary | ICD-10-CM

## 2014-01-08 MED ORDER — FUROSEMIDE 20 MG PO TABS: 20.0000 mg | ORAL_TABLET | Freq: Every day | ORAL | Status: DC

## 2014-01-15 ENCOUNTER — Other Ambulatory Visit (INDEPENDENT_AMBULATORY_CARE_PROVIDER_SITE_OTHER): Payer: 59

## 2014-01-15 DIAGNOSIS — I428 Other cardiomyopathies: Secondary | ICD-10-CM

## 2014-01-15 LAB — BASIC METABOLIC PANEL WITH GFR
BUN: 17 mg/dL (ref 6–23)
CO2: 28 meq/L (ref 19–32)
Calcium: 9.4 mg/dL (ref 8.4–10.5)
Chloride: 106 meq/L (ref 96–112)
Creatinine, Ser: 1.5 mg/dL (ref 0.4–1.5)
GFR: 59.3 mL/min — ABNORMAL LOW (ref >60.00)
Glucose, Bld: 63 mg/dL — ABNORMAL LOW (ref 70–99)
Potassium: 4.1 meq/L (ref 3.5–5.1)
Sodium: 136 meq/L (ref 135–145)

## 2014-01-18 ENCOUNTER — Encounter: Payer: Self-pay | Admitting: General Surgery

## 2014-04-06 ENCOUNTER — Encounter: Payer: Self-pay | Admitting: Cardiology

## 2014-04-11 ENCOUNTER — Emergency Department (HOSPITAL_COMMUNITY): Payer: 59

## 2014-04-11 ENCOUNTER — Emergency Department (HOSPITAL_COMMUNITY)
Admission: EM | Admit: 2014-04-11 | Discharge: 2014-04-11 | Disposition: A | Discharge status: Home / Self Care | Payer: 59 | Attending: Emergency Medicine | Admitting: Emergency Medicine

## 2014-04-11 ENCOUNTER — Encounter (HOSPITAL_COMMUNITY): Payer: Self-pay | Admitting: Emergency Medicine

## 2014-04-11 DIAGNOSIS — I4892 Unspecified atrial flutter: Secondary | ICD-10-CM | POA: Insufficient documentation

## 2014-04-11 DIAGNOSIS — Z7902 Long term (current) use of antithrombotics/antiplatelets: Secondary | ICD-10-CM | POA: Diagnosis not present

## 2014-04-11 DIAGNOSIS — Z87891 Personal history of nicotine dependence: Secondary | ICD-10-CM | POA: Diagnosis not present

## 2014-04-11 DIAGNOSIS — I129 Hypertensive chronic kidney disease with stage 1 through stage 4 chronic kidney disease, or unspecified chronic kidney disease: Secondary | ICD-10-CM | POA: Diagnosis not present

## 2014-04-11 DIAGNOSIS — Z79899 Other long term (current) drug therapy: Secondary | ICD-10-CM | POA: Insufficient documentation

## 2014-04-11 DIAGNOSIS — Z87438 Personal history of other diseases of male genital organs: Secondary | ICD-10-CM | POA: Diagnosis not present

## 2014-04-11 DIAGNOSIS — Z862 Personal history of diseases of the blood and blood-forming organs and certain disorders involving the immune mechanism: Secondary | ICD-10-CM | POA: Insufficient documentation

## 2014-04-11 DIAGNOSIS — I5040 Unspecified combined systolic (congestive) and diastolic (congestive) heart failure: Secondary | ICD-10-CM | POA: Insufficient documentation

## 2014-04-11 DIAGNOSIS — R42 Dizziness and giddiness: Secondary | ICD-10-CM | POA: Insufficient documentation

## 2014-04-11 DIAGNOSIS — N182 Chronic kidney disease, stage 2 (mild): Secondary | ICD-10-CM | POA: Diagnosis not present

## 2014-04-11 DIAGNOSIS — Z8639 Personal history of other endocrine, nutritional and metabolic disease: Secondary | ICD-10-CM | POA: Insufficient documentation

## 2014-04-11 DIAGNOSIS — Z8669 Personal history of other diseases of the nervous system and sense organs: Secondary | ICD-10-CM | POA: Diagnosis not present

## 2014-04-11 DIAGNOSIS — R51 Headache: Secondary | ICD-10-CM | POA: Insufficient documentation

## 2014-04-11 DIAGNOSIS — R519 Headache, unspecified: Secondary | ICD-10-CM

## 2014-04-11 LAB — CBC WITH DIFFERENTIAL/PLATELET
Basophils Absolute: 0 10*3/uL (ref 0.0–0.1)
Basophils Relative: 0 % (ref 0–1)
Eosinophils Absolute: 0 10*3/uL (ref 0.0–0.7)
Eosinophils Relative: 1 % (ref 0–5)
HCT: 35.5 % — ABNORMAL LOW (ref 39.0–52.0)
Hemoglobin: 12.2 g/dL — ABNORMAL LOW (ref 13.0–17.0)
Lymphocytes Relative: 31 % (ref 12–46)
Lymphs Abs: 1.4 10*3/uL (ref 0.7–4.0)
MCH: 31.7 pg (ref 26.0–34.0)
MCHC: 34.4 g/dL (ref 30.0–36.0)
MCV: 92.2 fL (ref 78.0–100.0)
Monocytes Absolute: 0.4 10*3/uL (ref 0.1–1.0)
Monocytes Relative: 9 % (ref 3–12)
Neutro Abs: 2.7 10*3/uL (ref 1.7–7.7)
Neutrophils Relative %: 59 % (ref 43–77)
Platelets: 237 10*3/uL (ref 150–400)
RBC: 3.85 MIL/uL — ABNORMAL LOW (ref 4.22–5.81)
RDW: 13 % (ref 11.5–15.5)
WBC: 4.5 10*3/uL (ref 4.0–10.5)

## 2014-04-11 LAB — BASIC METABOLIC PANEL
Anion gap: 8 (ref 5–15)
BUN: 32 mg/dL — ABNORMAL HIGH (ref 6–23)
CO2: 26 mEq/L (ref 19–32)
Calcium: 9.5 mg/dL (ref 8.4–10.5)
Chloride: 102 mEq/L (ref 96–112)
Creatinine, Ser: 1.62 mg/dL — ABNORMAL HIGH (ref 0.50–1.35)
GFR calc Af Amer: 48 mL/min — ABNORMAL LOW (ref >90)
GFR calc non Af Amer: 41 mL/min — ABNORMAL LOW (ref >90)
Glucose, Bld: 110 mg/dL — ABNORMAL HIGH (ref 70–99)
Potassium: 4.6 mEq/L (ref 3.7–5.3)
Sodium: 136 mEq/L — ABNORMAL LOW (ref 137–147)

## 2014-04-11 LAB — MAGNESIUM: Magnesium: 1.9 mg/dL (ref 1.5–2.5)

## 2014-04-11 LAB — TROPONIN I: Troponin I: <0.3 ng/mL (ref <0.30)

## 2014-04-11 LAB — CBG MONITORING, ED: Glucose-Capillary: 72 mg/dL (ref 70–99)

## 2014-04-11 MED ORDER — SODIUM CHLORIDE 0.9 % IV BOLUS (SEPSIS): 500.0000 mL | Freq: Once | INTRAVENOUS | Status: DC

## 2014-04-11 NOTE — ED Provider Notes (Signed)
CSN: [id number]     Arrival date & time 04/11/14  1057 History   First MD Initiated Contact with Patient 04/11/14 1211     Chief Complaint  Patient presents with  . Dizziness     (Consider location/radiation/quality/duration/timing/severity/associated sxs/prior Treatment) HPI  73yM with intermittently feeling "dizzy." Hard for him to describe in any detail. Does deny sensation of environment spinning/moving though. A couple episodes since two days ago. Most recently again while at church today. No appreciable exacerbating or relieving factors. No pain. No SOB. No acute visual changes. No fever or chills.   Past Medical History  Diagnosis Date  . Hypertension   . Combined systolic and diastolic heart failure     a. EF low normal 10/2012. b. EF 45% by echo 09/2013.  Marland Kitchen Bell palsy   . Erectile dysfunction   . Seizure     a. Hx of seizure 10/2011 treated with Keppra (MRI of brain revealed no acute abnormality 11/06/11) - pt denies knowlege of this.  . Mobitz (type) I (Wenckebach's) atrioventricular block     a. By Holter 10/2012.  . Mitral regurgitation     a. Mild-mod by echo 09/2013.  Marland Kitchen Atrial flutter     a. Newly recognized 09/2013, rate controlled. Not on any AVN blocking agents due to h/o 2nd degree type 1 heart block and controlled rate.  . CKD (chronic kidney disease), stage II   . Normocytic anemia   . Thyroid enlargement     a. Abnl xray 09/2013 - possible R thyroid enlargement.    Past Surgical History  Procedure Laterality Date  . Resection of melanoma    . Ablation  11-11-2013    CTI ablation by Dr Lovena Le   Family History  Problem Relation Age of Onset  . Heart disease Mother     unclear details  . Heart disease Father     unclear details  . Heart disease Sister     unclear details   History  Substance Use Topics  . Smoking status: Former Smoker    Quit date: 12/21/1993  . Smokeless tobacco: Never Used  . Alcohol Use: No    Review of Systems  All systems  reviewed and negative, other than as noted in HPI.   Allergies  Review of patient's allergies indicates no known allergies.  Home Medications   Prior to Admission medications   Medication Sig Start Date End Date Taking? Authorizing Provider  apixaban (ELIQUIS) 5 MG TABS tablet Take 5 mg by mouth daily.   Yes Historical Provider, MD  furosemide (LASIX) 20 MG tablet Take 1 tablet (20 mg total) by mouth daily. 01/08/14  Yes Sueanne Margarita, MD  lisinopril (PRINIVIL,ZESTRIL) 10 MG tablet Take 10 mg by mouth daily. 01/24/12  Yes Historical Provider, MD  Multiple Vitamin (MULTIVITAMIN) capsule Take 1 capsule by mouth daily.   Yes Historical Provider, MD  potassium chloride SA (K-DUR,KLOR-CON) 20 MEQ tablet Take 20 mEq by mouth daily.    Yes Historical Provider, MD   BP 133/59  Pulse 65  Temp(Src) 97.5 F (36.4 C)  Resp 18  Ht 5\' 11"  (1.803 m)  Wt 164 lb (74.39 kg)  BMI 22.88 kg/m2  SpO2 100% Physical Exam  Nursing note and vitals reviewed. Constitutional: He is oriented to person, place, and time. He appears well-developed and well-nourished. No distress.  HENT:  Head: Normocephalic and atraumatic.  Eyes: Conjunctivae are normal. Right eye exhibits no discharge. Left eye exhibits no discharge.  Neck: Neck  supple.  Cardiovascular: Normal rate, regular rhythm and normal heart sounds.  Exam reveals no gallop and no friction rub.   No murmur heard. Pulmonary/Chest: Effort normal and breath sounds normal. No respiratory distress. He has no wheezes.  Abdominal: Soft. He exhibits no distension. There is no tenderness.  Musculoskeletal: He exhibits no edema and no tenderness.  Neurological: He is alert and oriented to person, place, and time. No cranial nerve deficit. He exhibits normal muscle tone. Coordination normal.  Speech clear. content appropriate. Follows commands. No focal motor deficit.   Skin: Skin is warm and dry.  Psychiatric: He has a normal mood and affect. His behavior is  normal. Thought content normal.    ED Course  Procedures (including critical care time) Labs Review Labs Reviewed  BASIC METABOLIC PANEL - Abnormal; Notable for the following:    Sodium 136 (*)    Glucose, Bld 110 (*)    BUN 32 (*)    Creatinine, Ser 1.62 (*)    GFR calc non Af Amer 41 (*)    GFR calc Af Amer 48 (*)    All other components within normal limits  CBC WITH DIFFERENTIAL - Abnormal; Notable for the following:    RBC 3.85 (*)    Hemoglobin 12.2 (*)    HCT 35.5 (*)    All other components within normal limits  TROPONIN I  MAGNESIUM  CBG MONITORING, ED    Imaging Review No results found.   EKG Interpretation None      EKG:  Rhythm: sinus with first degree block and PACs? 2nd degree AV block? Vent. rate 60 BPM QRS duration 117 ms QT/QTc 459/459 ms ST segments: NSST changes Comparison: Not exactly sure of underlying rhythm. Has p waves with 1st degree AV block and PACs versus a type 2 block, rhythm appears similar to previous EKG from 10/2013   MDM   Final diagnoses:  Nonintractable headache, unspecified chronicity pattern, unspecified headache type  Dizziness    73yM with dizziness. Vague historian. Denies vertigo, but beyond this it's hard for me to get an exact sense of his symptoms. Abnormal EKG but similar to prior. He is insist on going home and that he feels completely better ED w/u has been fairly unrevealing. Perhaps some degree of mild dehydration. Pt refusing IV. No reason he cannot increase PO fluids instead. Low suspicion for emergent process.    Virgel Manifold, MD 04/19/14 1325

## 2014-04-11 NOTE — ED Notes (Signed)
Pt refused IV at this time

## 2014-04-11 NOTE — ED Notes (Signed)
Pt staes had bells palsy recently and feels like he may haver facial tingling to left side again unkn when sx began per pt

## 2014-04-11 NOTE — Discharge Instructions (Signed)

## 2014-04-11 NOTE — ED Notes (Signed)
Ptl. Stated, I started having some dizziness yesterday

## 2014-04-11 NOTE — ED Notes (Signed)
Pt states he has only ate a banana this am

## 2014-05-19 ENCOUNTER — Encounter: Payer: Self-pay | Admitting: Cardiology

## 2014-06-03 ENCOUNTER — Encounter (HOSPITAL_COMMUNITY): Payer: Self-pay | Admitting: Internal Medicine

## 2014-07-07 ENCOUNTER — Ambulatory Visit: Payer: Medicare Other | Admitting: Cardiology

## 2014-07-13 ENCOUNTER — Telehealth: Payer: Self-pay | Admitting: Cardiology

## 2014-07-13 NOTE — Telephone Encounter (Signed)
New message       Refill lisinopril 20mg  to [facility] at [address].  Pt missed his last appt but resc.  Mindy asked this refill be forwarded to the nurse because of missed appt

## 2014-07-13 NOTE — Telephone Encounter (Signed)
Left message to call back  

## 2014-07-14 NOTE — Telephone Encounter (Signed)
Left message to call back  

## 2014-07-20 NOTE — Telephone Encounter (Signed)
Spoke with patient's wife, who asked for refill of Lisinopril 20 mg daily. Per EPIC, he was last ordered 10 mg daily. Per Dr. Theodosia Blender last OV, he was taking 20 mg daily.   Routing to Dr. Radford Pax for OK to refill at 20 mg daily since his PCP last ordered the medication.

## 2014-07-21 NOTE — Telephone Encounter (Signed)
[facility] pharmacy tech states that they have been prescribing him Lisinopril 20 mg daily.

## 2014-07-21 NOTE — Telephone Encounter (Signed)
Please find out from pharmacy what dose he has been taking

## 2014-07-21 NOTE — Telephone Encounter (Signed)
Ok to refill Lisinopril

## 2014-07-22 MED ORDER — LISINOPRIL 20 MG PO TABS: 20.0000 mg | ORAL_TABLET | Freq: Every day | ORAL | Status: DC

## 2014-07-22 NOTE — Telephone Encounter (Signed)
Left message for patient that refill has been sent to [facility].  Instructed him to call if there are any problems or questions.

## 2014-08-01 NOTE — Progress Notes (Signed)
This encounter was created in error - please disregard.

## 2014-08-02 ENCOUNTER — Encounter: Payer: Medicare Other | Admitting: Cardiology

## 2014-09-07 NOTE — Progress Notes (Signed)
Cardiology Office Note   Date:  09/08/2014   ID:  Tehran Rabenold, DOB 10-Jul-1940, MRN [id number]  PCP:  Gara Kroner, MD  Cardiologist:   Sueanne Margarita, MD   Chief Complaint  Patient presents with  . Atrial Flutter  . Congestive Heart Failure  . Hypertension      History of Present Illness: This is a 74year-old male with past medical history of atrial fibrillation, diastolic congestive heart failure, moderate mitral regurgitation, hypertension and atrial flutter. Echo 12/21/2013 showed mild LV dysfunction with EF 45% with HK in the mid to basal inferior/inferoseptal and lateral walls, mild to mod MR, moderately dilated RV with mild to mod reduced RVF and mild pulmonary HTN PASP 33mmHg. He is on Eliquis for atrial flutter. He was seen by Dr. Lovena Le and underwent aflutter ablation.Outpt nuclear stress test 11/2013 with no ischemia He denies any chest pain or SOB. He denies any LE edema (except with traveling), palpitation or dizziness.     Past Medical History  Diagnosis Date  . Hypertension   . Combined systolic and diastolic heart failure     a. EF low normal 10/2012. b. EF 45% by echo 09/2013.  Marland Kitchen Bell palsy   . Erectile dysfunction   . Seizure     a. Hx of seizure 10/2011 treated with Keppra (MRI of brain revealed no acute abnormality 11/06/11) - pt denies knowlege of this.  . Mobitz (type) I (Wenckebach's) atrioventricular block     a. By Holter 10/2012.  . Mitral regurgitation     a. Mild-mod by echo 09/2013.  Marland Kitchen Atrial flutter     a. Newly recognized 09/2013, rate controlled. Not on any AVN blocking agents due to h/o 2nd degree type 1 heart block and controlled rate.  . CKD (chronic kidney disease), stage II   . Normocytic anemia   . Thyroid enlargement     a. Abnl xray 09/2013 - possible R thyroid enlargement.     Past Surgical History  Procedure Laterality Date  . Resection of melanoma    . Ablation  11-11-2013    CTI ablation by Dr Lovena Le  . Atrial flutter  ablation N/A 11/11/2013    Procedure: ATRIAL FLUTTER ABLATION;  Surgeon: Evans Lance, MD;  Location: [facility] CATH LAB;  Service: Cardiovascular;  Laterality: N/A;     Current Outpatient Prescriptions  Medication Sig Dispense Refill  . apixaban (ELIQUIS) 5 MG TABS tablet Take 1 tablet (5 mg total) by mouth 2 (two) times daily. 60 tablet 6  . furosemide (LASIX) 20 MG tablet Take 1 tablet (20 mg total) by mouth daily. 30 tablet 3  . lisinopril (PRINIVIL,ZESTRIL) 20 MG tablet Take 1 tablet (20 mg total) by mouth daily. 30 tablet 6  . Multiple Vitamin (MULTIVITAMIN) capsule Take 1 capsule by mouth daily.    . potassium chloride SA (K-DUR,KLOR-CON) 20 MEQ tablet Take 20 mEq by mouth daily.      No current facility-administered medications for this visit.    Allergies:   Review of patient's allergies indicates no known allergies.    Social History:  The patient  reports that he quit smoking about 20 years ago. He has never used smokeless tobacco. He reports that he does not drink alcohol or use illicit drugs.   Family History:  The patient's family history includes Heart disease in his father, mother, and sister.    ROS:  Please see the history of present illness.   Otherwise, review of systems are positive for  none.   All other systems are reviewed and negative.    PHYSICAL EXAM: VS:  BP 120/58 mmHg  Pulse 62  Ht 5\' 11"  (1.803 m)  Wt 163 lb 1.9 oz (73.991 kg)  BMI 22.76 kg/m2  SpO2 99% , BMI Body mass index is 22.76 kg/(m^2). GEN: Well nourished, well developed, in no acute distress HEENT: normal Neck: no JVD, carotid bruits, or masses Cardiac: RRR; no murmurs, rubs, or gallops,no edema  Respiratory:  clear to auscultation bilaterally, normal work of breathing GI: soft, nontender, nondistended, + BS MS: no deformity or atrophy Skin: warm and dry, no rash Neuro:  Strength and sensation are intact Psych: euthymic mood, full affect   EKG:  EKG is not ordered today.    Recent  Labs: 10/09/2013: Pro B Natriuretic peptide (BNP) 2323.0*; TSH 0.631 10/10/2013: ALT 16 04/11/2014: BUN 32*; Creatinine 1.62*; Hemoglobin 12.2*; Magnesium 1.9; Platelets 237; Potassium 4.6; Sodium 136*    Lipid Panel    Component Value Date/Time   CHOL 122 10/10/2013 0340   TRIG 34 10/10/2013 0340   HDL 69 10/10/2013 0340   CHOLHDL 1.8 10/10/2013 0340   VLDL 7 10/10/2013 0340   LDLCALC 46 10/10/2013 0340      Wt Readings from Last 3 Encounters:  09/08/14 163 lb 1.9 oz (73.991 kg)  04/11/14 164 lb (74.39 kg)  12/21/13 157 lb (71.215 kg)     ASSESSMENT AND PLAN:  1. Atrial flutter s/p recent flutter ablation - continue Eliquis.  I instructed him to take it BID instead of daily 2. Type I second degree AV block on EKG - asyptomatic 3. Chest pain - resolved with no ischemia on nuclear stress test 11/2013 4. Mild LV dysfunction EF 45% by echo - not gated on nuclear due to rhythm irregularity 5. Chronic combined systolic/diastolic CHF - appears euvolemic on exam - continue Lasix/ACE I - check BMET 6. HTN controlled on ACE I 7. Mild to moderate MR 8. Mild pulmonary HTN PASP 71mmHg by echo 09/2013 - recheck 2D echo to reassess    Current medicines are reviewed at length with the patient today.  The patient does not have concerns regarding medicines.  The following changes have been made:  no change  Labs/ tests ordered today include:  BMET, 2D echo  Orders Placed This Encounter  Procedures  . Basic Metabolic Panel (BMET)  . 2D Echocardiogram without contrast     Disposition:   FU with me in 6 months   Signed, Sueanne Margarita, MD  09/08/2014 4:11 PM    [facility] [address] [place], [place], [place]  [address] Phone: [phone]; Fax: [phone]

## 2014-09-08 ENCOUNTER — Encounter: Payer: Self-pay | Admitting: Cardiology

## 2014-09-08 ENCOUNTER — Ambulatory Visit (INDEPENDENT_AMBULATORY_CARE_PROVIDER_SITE_OTHER): Payer: 59 | Admitting: Cardiology

## 2014-09-08 VITALS — BP 120/58 | HR 62 | Ht 71.0 in | Wt 163.1 lb

## 2014-09-08 DIAGNOSIS — I5042 Chronic combined systolic (congestive) and diastolic (congestive) heart failure: Secondary | ICD-10-CM | POA: Diagnosis not present

## 2014-09-08 DIAGNOSIS — I420 Dilated cardiomyopathy: Secondary | ICD-10-CM

## 2014-09-08 DIAGNOSIS — I483 Typical atrial flutter: Secondary | ICD-10-CM | POA: Diagnosis not present

## 2014-09-08 DIAGNOSIS — I10 Essential (primary) hypertension: Secondary | ICD-10-CM | POA: Diagnosis not present

## 2014-09-08 MED ORDER — APIXABAN 5 MG PO TABS: 5.0000 mg | ORAL_TABLET | Freq: Two times a day (BID) | ORAL | Status: DC

## 2014-09-08 NOTE — Patient Instructions (Signed)
Your physician has recommended you make the following change in your medication:  1) INCREASE ELIQUIS to 5 mg TWICE DAILY  Your physician recommends that you have lab work TODAY (BMET).  Your physician has requested that you have an echocardiogram. Echocardiography is a painless test that uses sound waves to create images of your heart. It provides your doctor with information about the size and shape of your heart and how well your heart's chambers and valves are working. This procedure takes approximately one hour. There are no restrictions for this procedure.  Your physician wants you to follow-up in: 6 months with Dr. Radford Pax. You will receive a reminder letter in the mail two months in advance. If you don't receive a letter, please call our office to schedule the follow-up appointment.

## 2014-09-09 LAB — BASIC METABOLIC PANEL
BUN: 31 mg/dL — ABNORMAL HIGH (ref 6–23)
CHLORIDE: 102 meq/L (ref 96–112)
CO2: 27 mEq/L (ref 19–32)
Calcium: 9.3 mg/dL (ref 8.4–10.5)
Creatinine, Ser: 1.88 mg/dL — ABNORMAL HIGH (ref 0.40–1.50)
GFR: 45.61 mL/min — AB (ref >60.00)
GLUCOSE: 90 mg/dL (ref 70–99)
POTASSIUM: 4.5 meq/L (ref 3.5–5.1)
SODIUM: 136 meq/L (ref 135–145)

## 2014-09-10 ENCOUNTER — Other Ambulatory Visit: Payer: Self-pay | Admitting: *Deleted

## 2014-09-10 DIAGNOSIS — I10 Essential (primary) hypertension: Secondary | ICD-10-CM

## 2014-09-10 MED ORDER — AMLODIPINE BESYLATE 5 MG PO TABS: 5.0000 mg | ORAL_TABLET | Freq: Every day | ORAL | Status: DC

## 2014-09-13 ENCOUNTER — Other Ambulatory Visit: Payer: Self-pay | Admitting: *Deleted

## 2014-09-13 ENCOUNTER — Ambulatory Visit (HOSPITAL_COMMUNITY): Payer: 59 | Attending: Cardiology | Admitting: Radiology

## 2014-09-13 DIAGNOSIS — I5042 Chronic combined systolic (congestive) and diastolic (congestive) heart failure: Secondary | ICD-10-CM | POA: Insufficient documentation

## 2014-09-13 DIAGNOSIS — I483 Typical atrial flutter: Secondary | ICD-10-CM | POA: Insufficient documentation

## 2014-09-13 DIAGNOSIS — I420 Dilated cardiomyopathy: Secondary | ICD-10-CM | POA: Insufficient documentation

## 2014-09-13 NOTE — Progress Notes (Signed)
Echocardiogram performed.  

## 2014-09-17 ENCOUNTER — Other Ambulatory Visit: Payer: Medicare Other

## 2014-09-17 ENCOUNTER — Telehealth: Payer: Self-pay

## 2014-09-17 DIAGNOSIS — I340 Nonrheumatic mitral (valve) insufficiency: Secondary | ICD-10-CM

## 2014-09-17 DIAGNOSIS — I272 Pulmonary hypertension, unspecified: Secondary | ICD-10-CM

## 2014-09-17 NOTE — Telephone Encounter (Signed)
Informed patient of results and verbal understanding expressed.  Repeat ECHO ordered to be scheduled in 1 year. Patient agrees with treatment plan. 

## 2014-09-17 NOTE — Telephone Encounter (Signed)
-----   Message from Sueanne Margarita, MD sent at 09/13/2014  9:11 PM EDT ----- Please let patient know that heart function is mildly reduced with moderately thickened AV leaflets with no AS, moderate MR and mild pulmonary HTN - please repeat study in 1 year

## 2014-11-08 ENCOUNTER — Other Ambulatory Visit: Payer: Self-pay | Admitting: Physician Assistant

## 2014-11-08 NOTE — Telephone Encounter (Signed)
Dr Radford Pax has never refilled this. Ok to fill? Please advise. Thanks, MI

## 2014-11-15 ENCOUNTER — Other Ambulatory Visit: Payer: Self-pay | Admitting: *Deleted

## 2014-11-15 MED ORDER — POTASSIUM CHLORIDE CRYS ER 20 MEQ PO TBCR: 20.0000 meq | EXTENDED_RELEASE_TABLET | Freq: Every day | ORAL | Status: DC

## 2014-11-15 NOTE — Telephone Encounter (Signed)
Ok to refill? Please advise. Thanks, MI 

## 2014-12-30 ENCOUNTER — Encounter: Payer: Self-pay | Admitting: Cardiology

## 2015-08-09 ENCOUNTER — Other Ambulatory Visit: Payer: Self-pay | Admitting: Family Medicine

## 2015-08-09 ENCOUNTER — Ambulatory Visit
Admission: RE | Admit: 2015-08-09 | Discharge: 2015-08-09 | Disposition: A | Discharge status: Home / Self Care | Payer: 59 | Source: Ambulatory Visit | Attending: Family Medicine | Admitting: Family Medicine

## 2015-08-09 DIAGNOSIS — M25561 Pain in right knee: Secondary | ICD-10-CM

## 2015-08-20 ENCOUNTER — Other Ambulatory Visit: Payer: Self-pay | Admitting: Cardiology

## 2015-09-08 ENCOUNTER — Ambulatory Visit (INDEPENDENT_AMBULATORY_CARE_PROVIDER_SITE_OTHER): Payer: 59 | Admitting: Cardiology

## 2015-09-08 ENCOUNTER — Encounter: Payer: Self-pay | Admitting: Cardiology

## 2015-09-08 VITALS — BP 140/66 | HR 66 | Ht 71.0 in | Wt 161.0 lb

## 2015-09-08 DIAGNOSIS — I420 Dilated cardiomyopathy: Secondary | ICD-10-CM

## 2015-09-08 DIAGNOSIS — I272 Other secondary pulmonary hypertension: Secondary | ICD-10-CM

## 2015-09-08 DIAGNOSIS — I10 Essential (primary) hypertension: Secondary | ICD-10-CM

## 2015-09-08 DIAGNOSIS — I441 Atrioventricular block, second degree: Secondary | ICD-10-CM

## 2015-09-08 DIAGNOSIS — R42 Dizziness and giddiness: Secondary | ICD-10-CM

## 2015-09-08 DIAGNOSIS — I4892 Unspecified atrial flutter: Secondary | ICD-10-CM

## 2015-09-08 DIAGNOSIS — I5042 Chronic combined systolic (congestive) and diastolic (congestive) heart failure: Secondary | ICD-10-CM | POA: Diagnosis not present

## 2015-09-08 DIAGNOSIS — I340 Nonrheumatic mitral (valve) insufficiency: Secondary | ICD-10-CM

## 2015-09-08 HISTORY — DX: Atrioventricular block, second degree: I44.1

## 2015-09-08 LAB — BASIC METABOLIC PANEL
BUN: 32 mg/dL — AB (ref 7–25)
CHLORIDE: 100 mmol/L (ref 98–110)
CO2: 24 mmol/L (ref 20–31)
Calcium: 9.7 mg/dL (ref 8.6–10.3)
Creat: 2.02 mg/dL — ABNORMAL HIGH (ref 0.70–1.18)
Glucose, Bld: 95 mg/dL (ref 65–99)
POTASSIUM: 4.2 mmol/L (ref 3.5–5.3)
Sodium: 136 mmol/L (ref 135–146)

## 2015-09-08 NOTE — Patient Instructions (Addendum)
Medication Instructions:  Your physician recommends that you continue on your current medications as directed. Please refer to the Current Medication list given to you today.   Labwork: TODAY: BMET  Testing/Procedures: Your physician has requested that you have an echocardiogram. Echocardiography is a painless test that uses sound waves to create images of your heart. It provides your doctor with information about the size and shape of your heart and how well your heart's chambers and valves are working. This procedure takes approximately one hour. There are no restrictions for this procedure.  Dr. Radford Pax recommends you have a DOBUTAMINE STRESS TEST.  Your physician has recommended that you wear an event monitor. Event monitors are medical devices that record the heart's electrical activity. Doctors most often [place] these monitors to diagnose arrhythmias. Arrhythmias are problems with the speed or rhythm of the heartbeat. The monitor is a small, portable device. You can wear one while you do your normal daily activities. This is usually used to diagnose what is causing palpitations/syncope (passing out).  Follow-Up: Your physician wants you to follow-up in: 6 months with Dr. Radford Pax. You will receive a reminder letter in the mail two months in advance. If you don't receive a letter, please call our office to schedule the follow-up appointment.   Any Other Special Instructions Will Be Listed Below (If Applicable).     If you need a refill on your cardiac medications before your next appointment, please call your pharmacy.

## 2015-09-08 NOTE — Progress Notes (Signed)
Cardiology Office Note   Date:  09/08/2015   ID:  Robert Gardner, DOB [birth year], MRN [id number]  PCP:  Robert Kroner, MD    Chief Complaint  Patient presents with  . Congestive Heart Failure  . Hypertension  . Mitral Regurgitation      History of Present Illness: Robert Gardner is a 75 y.o. male with past medical history of atrial fibrillation, diastolic congestive heart failure, moderate mitral regurgitation, hypertension and atrial flutter. Echo 12/21/2013 showed mild LV dysfunction with EF 45% with HK in the mid to basal inferior/inferoseptal and lateral walls, mild to mod MR, moderately dilated RV with mild to mod reduced RVF and mild pulmonary HTN PASP 44mmHg. He is on Eliquis for atrial flutter. He was seen by Dr. Lovena Le and underwent aflutter ablation.Outpt nuclear stress test 11/2013 with no ischemia He denies any chest pain but occasionally has some mild DOE. He denies any LE edema, palpitation or syncope.  Occasonally he will have some dizziness when walking.     Past Medical History  Diagnosis Date  . Hypertension   . Combined systolic and diastolic heart failure ([place])     EF 45% by echo 08/2014.  Marland Kitchen Bell palsy   . Erectile dysfunction   . Seizure ([place])     a. Hx of seizure 10/2011 treated with Keppra (MRI of brain revealed no acute abnormality 11/06/11) - pt denies knowlege of this.  . Mobitz (type) I (Wenckebach's) atrioventricular block     a. By Holter 10/2012.  . Mitral regurgitation     Moderate by echo 08/2014  . Atrial flutter ([place])     s/p flutter ablation  . CKD (chronic kidney disease), stage II   . Normocytic anemia   . Thyroid enlargement     a. Abnl xray 09/2013 - possible R thyroid enlargement.   . Second degree AV block, Mobitz type I 09/08/2015    Past Surgical History  Procedure Laterality Date  . Resection of melanoma    . Ablation  11-11-2013    CTI ablation by Dr Lovena Le  . Atrial flutter ablation N/A 11/11/2013   Procedure: ATRIAL FLUTTER ABLATION;  Surgeon: Evans Lance, MD;  Location: [facility] CATH LAB;  Service: Cardiovascular;  Laterality: N/A;     Current Outpatient Prescriptions  Medication Sig Dispense Refill  . amLODipine (NORVASC) 5 MG tablet Take 1 tablet (5 mg total) by mouth daily. 180 tablet 3  . apixaban (ELIQUIS) 5 MG TABS tablet Take 1/2 tablet (2.5 mg) twice a day    . furosemide (LASIX) 20 MG tablet Take 1 tablet (20 mg total) by mouth daily. 30 tablet 3  . Multiple Vitamin (MULTIVITAMIN) capsule Take 1 capsule by mouth daily.    . potassium chloride SA (K-DUR,KLOR-CON) 20 MEQ tablet TAKE ONE TABLET BY MOUTH ONCE DAILY 30 tablet 0   No current facility-administered medications for this visit.    Allergies:   Carbamazepine    Social History:  The patient  reports that he quit smoking about 21 years ago. He has never used smokeless tobacco. He reports that he does not drink alcohol or use illicit drugs.   Family History:  The patient's family history includes Heart disease in his father, mother, and sister.    ROS:  Please see the history of present illness.   Otherwise, review of systems are positive for none.   All other systems are reviewed  and negative.    PHYSICAL EXAM: VS:  BP 140/66 mmHg  Pulse 66  Ht 5\' 11"  (1.803 m)  Wt 161 lb (73.029 kg)  BMI 22.46 kg/m2  SpO2 98% , BMI Body mass index is 22.46 kg/(m^2). GEN: Well nourished, well developed, in no acute distress HEENT: normal Neck: no JVD, carotid bruits, or masses Cardiac: RRR; no murmurs, rubs, or gallops,no edema.  Occasional ectopy. Respiratory:  clear to auscultation bilaterally, normal work of breathing GI: soft, nontender, nondistended, + BS MS: no deformity or atrophy Skin: warm and dry, no rash Neuro:  Strength and sensation are intact Psych: euthymic mood, full affect   EKG:  EKG is not ordered today.    Recent Labs: 09/08/2014: BUN 31*; Creatinine, Ser 1.88*; Potassium 4.5; Sodium 136     Lipid Panel    Component Value Date/Time   CHOL 122 10/10/2013 0340   TRIG 34 10/10/2013 0340   HDL 69 10/10/2013 0340   CHOLHDL 1.8 10/10/2013 0340   VLDL 7 10/10/2013 0340   LDLCALC 46 10/10/2013 0340      Wt Readings from Last 3 Encounters:  09/08/15 161 lb (73.029 kg)  09/08/14 163 lb 1.9 oz (73.991 kg)  04/11/14 164 lb (74.39 kg)     ASSESSMENT AND PLAN:  1. Atrial flutter s/p  flutter ablation - continue Eliquis dosed for CKD - check BMET and CBC 2. Type I second degree AV block on EKG - he is having some dizziness when walking so I will get a 30 day heart monitor to assess for any higher degree AV block 3. Chest pain - resolved with no ischemia on nuclear stress test 11/2013 but he is having some DOE and only walks in the house.   4. Mild LV dysfunction EF 45% by echo - not gated on nuclear due to rhythm irregularity 5. Chronic combined systolic/diastolic CHF - appears euvolemic on exam - continue Lasix - check BMET 6. HTN controlled on amlodipine 7. Moderate MR - repeat 2D echo to evaluate for progression 8. Mild pulmonary HTN PASP 64mmHg by echo 08/2014 - recheck 2D echo to reassess  9. Preoperative cardiac clearance for TKR.  Given his sedentary state right now it is difficult to assess for any significant exertional angina.  He has had some DOE with walking in the house.  I will get a dobutamine myoview to rule out ischemia.  Will avoid Lexiscan due to [facility] heart block and he cannot walk on treadmill due to DJD of knee.    Current medicines are reviewed at length with the patient today.  The patient does not have concerns regarding medicines.  The following changes have been made:  no change  Labs/ tests ordered today: See above Assessment and Plan  Orders Placed This Encounter  Procedures  . Myocardial Perfusion Imaging  . EKG 12-Lead  . ECHOCARDIOGRAM COMPLETE     Disposition:   FU with me in 6 months  Signed, Sueanne Margarita, MD  09/08/2015  2:01 PM    [facility] [place], [place], [place]  [address] Phone: [phone]; Fax: [phone]

## 2015-09-09 ENCOUNTER — Telehealth: Payer: Self-pay

## 2015-09-09 DIAGNOSIS — I10 Essential (primary) hypertension: Secondary | ICD-10-CM

## 2015-09-09 DIAGNOSIS — I5042 Chronic combined systolic (congestive) and diastolic (congestive) heart failure: Secondary | ICD-10-CM

## 2015-09-09 DIAGNOSIS — I4891 Unspecified atrial fibrillation: Secondary | ICD-10-CM

## 2015-09-09 MED ORDER — POTASSIUM CHLORIDE CRYS ER 20 MEQ PO TBCR: 20.0000 meq | EXTENDED_RELEASE_TABLET | ORAL | Status: DC

## 2015-09-09 MED ORDER — FUROSEMIDE 20 MG PO TABS: 20.0000 mg | ORAL_TABLET | ORAL | Status: DC

## 2015-09-09 NOTE — Telephone Encounter (Signed)
Informed patient of results and verbal understanding expressed.  Instructed patient to DECREASE LASIX to 20 mg QOD and take KDUR QOD same day as Lasix. BMET scheduled for next Friday. Patient agrees with treatment plan.

## 2015-09-09 NOTE — Telephone Encounter (Signed)
-----   Message from Sueanne Margarita, MD sent at 09/09/2015 10:18 AM EDT ----- Creatinine increased - decrease lasix to 20mg  QOD  And Kdur to QOD same day as lasix and recheck BMET in 1 week

## 2015-09-16 ENCOUNTER — Other Ambulatory Visit (INDEPENDENT_AMBULATORY_CARE_PROVIDER_SITE_OTHER): Payer: 59 | Admitting: *Deleted

## 2015-09-16 DIAGNOSIS — I5042 Chronic combined systolic (congestive) and diastolic (congestive) heart failure: Secondary | ICD-10-CM

## 2015-09-16 DIAGNOSIS — I10 Essential (primary) hypertension: Secondary | ICD-10-CM | POA: Diagnosis not present

## 2015-09-16 DIAGNOSIS — I4891 Unspecified atrial fibrillation: Secondary | ICD-10-CM

## 2015-09-16 LAB — BASIC METABOLIC PANEL
BUN: 21 mg/dL (ref 7–25)
CO2: 24 mmol/L (ref 20–31)
CREATININE: 1.89 mg/dL — AB (ref 0.70–1.18)
Calcium: 9.1 mg/dL (ref 8.6–10.3)
Chloride: 105 mmol/L (ref 98–110)
GLUCOSE: 89 mg/dL (ref 65–99)
Potassium: 4.1 mmol/L (ref 3.5–5.3)
SODIUM: 136 mmol/L (ref 135–146)

## 2015-09-17 ENCOUNTER — Other Ambulatory Visit: Payer: Self-pay | Admitting: Cardiology

## 2015-09-20 ENCOUNTER — Telehealth (HOSPITAL_COMMUNITY): Payer: Self-pay | Admitting: *Deleted

## 2015-09-20 NOTE — Telephone Encounter (Signed)
Patient given detailed instructions per Myocardial Perfusion Study Information Sheet for the test on 09/23/15 at 8:45. Patient notified to arrive 15 minutes early and that it is imperative to arrive on time for appointment to keep from having the test rescheduled.  If you need to cancel or reschedule your appointment, please call the office within 24 hours of your appointment. Failure to do so may result in a cancellation of your appointment, and a $50 no show fee. Patient verbalized understanding.Robert Gardner

## 2015-09-23 ENCOUNTER — Other Ambulatory Visit: Payer: Self-pay

## 2015-09-23 ENCOUNTER — Ambulatory Visit (HOSPITAL_COMMUNITY): Payer: 59 | Attending: Cardiology

## 2015-09-23 ENCOUNTER — Ambulatory Visit (INDEPENDENT_AMBULATORY_CARE_PROVIDER_SITE_OTHER): Payer: 59

## 2015-09-23 ENCOUNTER — Ambulatory Visit (HOSPITAL_BASED_OUTPATIENT_CLINIC_OR_DEPARTMENT_OTHER): Payer: 59

## 2015-09-23 DIAGNOSIS — I509 Heart failure, unspecified: Secondary | ICD-10-CM | POA: Insufficient documentation

## 2015-09-23 DIAGNOSIS — R9439 Abnormal result of other cardiovascular function study: Secondary | ICD-10-CM | POA: Diagnosis not present

## 2015-09-23 DIAGNOSIS — I110 Hypertensive heart disease with heart failure: Secondary | ICD-10-CM | POA: Insufficient documentation

## 2015-09-23 DIAGNOSIS — I441 Atrioventricular block, second degree: Secondary | ICD-10-CM | POA: Diagnosis not present

## 2015-09-23 DIAGNOSIS — I420 Dilated cardiomyopathy: Secondary | ICD-10-CM | POA: Diagnosis not present

## 2015-09-23 DIAGNOSIS — R42 Dizziness and giddiness: Secondary | ICD-10-CM

## 2015-09-23 DIAGNOSIS — I272 Other secondary pulmonary hypertension: Secondary | ICD-10-CM

## 2015-09-23 DIAGNOSIS — I371 Nonrheumatic pulmonary valve insufficiency: Secondary | ICD-10-CM | POA: Diagnosis not present

## 2015-09-23 DIAGNOSIS — Z87891 Personal history of nicotine dependence: Secondary | ICD-10-CM | POA: Insufficient documentation

## 2015-09-23 DIAGNOSIS — I340 Nonrheumatic mitral (valve) insufficiency: Secondary | ICD-10-CM | POA: Insufficient documentation

## 2015-09-23 DIAGNOSIS — I351 Nonrheumatic aortic (valve) insufficiency: Secondary | ICD-10-CM | POA: Insufficient documentation

## 2015-09-23 DIAGNOSIS — I4892 Unspecified atrial flutter: Secondary | ICD-10-CM | POA: Insufficient documentation

## 2015-09-23 DIAGNOSIS — R0609 Other forms of dyspnea: Secondary | ICD-10-CM | POA: Diagnosis not present

## 2015-09-23 LAB — MYOCARDIAL PERFUSION IMAGING
CSEPPHR: 66 {beats}/min
LHR: 0.36
LVDIAVOL: 201 mL (ref 62–150)
LVSYSVOL: 120 mL
NUC STRESS TID: 1.02
Rest HR: 50 {beats}/min
SDS: 0
SRS: 10
SSS: 10

## 2015-09-23 MED ORDER — TECHNETIUM TC 99M SESTAMIBI GENERIC - CARDIOLITE
10.6000 | Freq: Once | INTRAVENOUS | Status: AC | PRN
  Administered 2015-09-23: 11 via INTRAVENOUS

## 2015-09-23 MED ORDER — TECHNETIUM TC 99M SESTAMIBI GENERIC - CARDIOLITE
30.8000 | Freq: Once | INTRAVENOUS | Status: AC | PRN
  Administered 2015-09-23: 30.8 via INTRAVENOUS

## 2015-09-23 MED ORDER — AMINOPHYLLINE 25 MG/ML IV SOLN
75.0000 mg | Freq: Once | INTRAVENOUS | Status: AC
  Administered 2015-09-23: 75 mg via INTRAVENOUS

## 2015-09-23 MED ORDER — REGADENOSON 0.4 MG/5ML IV SOLN
0.4000 mg | Freq: Once | INTRAVENOUS | Status: AC
  Administered 2015-09-23: 0.4 mg via INTRAVENOUS

## 2015-09-26 ENCOUNTER — Telehealth: Payer: Self-pay | Admitting: Cardiology

## 2015-09-26 DIAGNOSIS — I272 Pulmonary hypertension, unspecified: Secondary | ICD-10-CM

## 2015-09-26 NOTE — Telephone Encounter (Signed)
Informed patient Robert Gardner was called last week and told testing was being done and clearance would be sent as soon as Dr. Radford Pax gives it. Informed patient of ECHO results and verbal understanding expressed.   Chest CT angio and PFTs ordered for scheduling. Informed patient he will be notified if/when Dr. Radford Pax clears him for surgery. Patient agrees with treatment plan.

## 2015-09-26 NOTE — Telephone Encounter (Signed)
New Message  Pt wife calling to speak w/ RN- following up on status of surgical clearance- stated taht [facility] has already sent info over about pt's upcoming knee replacement. Please call back and discuss.

## 2015-09-26 NOTE — Telephone Encounter (Signed)
-----   Message from Sueanne Margarita, MD sent at 09/23/2015 11:20 AM EDT ----- Mildly reduced LVF with EF 45-50% (this is stable) with moderate MR (unchanged) and moderate pulmonary HTN - the PASP has increased since last assessment.  Please order a Chest CT angio to rule out chronic PE and PFTs with DLCO both to evaluate for etiology of pulmonary HTN

## 2015-09-27 NOTE — Telephone Encounter (Signed)
Informed surgical coordinator at [facility] that patient is not yet cleared for surgery. He has an appointment next week to discuss heart cath. Juliann Pulse was grateful for call.

## 2015-09-29 ENCOUNTER — Ambulatory Visit (INDEPENDENT_AMBULATORY_CARE_PROVIDER_SITE_OTHER)
Admission: RE | Admit: 2015-09-29 | Discharge: 2015-09-29 | Disposition: A | Discharge status: Home / Self Care | Payer: 59 | Source: Ambulatory Visit | Attending: Cardiology | Admitting: Cardiology

## 2015-09-29 DIAGNOSIS — I272 Other secondary pulmonary hypertension: Secondary | ICD-10-CM

## 2015-09-29 MED ORDER — IOPAMIDOL (ISOVUE-370) INJECTION 76%
65.0000 mL | Freq: Once | INTRAVENOUS | Status: AC | PRN
  Administered 2015-09-29: 65 mL via INTRAVENOUS

## 2015-10-03 ENCOUNTER — Encounter: Payer: Self-pay | Admitting: Cardiology

## 2015-10-03 ENCOUNTER — Ambulatory Visit (INDEPENDENT_AMBULATORY_CARE_PROVIDER_SITE_OTHER): Payer: 59 | Admitting: Cardiology

## 2015-10-03 VITALS — BP 160/60 | HR 66 | Ht 71.0 in | Wt 165.2 lb

## 2015-10-03 DIAGNOSIS — Z01812 Encounter for preprocedural laboratory examination: Secondary | ICD-10-CM | POA: Diagnosis not present

## 2015-10-03 LAB — CBC WITH DIFFERENTIAL/PLATELET
Basophils Absolute: 43 cells/uL (ref 0–200)
Basophils Relative: 1 %
EOS PCT: 2 %
Eosinophils Absolute: 86 cells/uL (ref 15–500)
HCT: 30.6 % — ABNORMAL LOW (ref 38.5–50.0)
Hemoglobin: 10 g/dL — ABNORMAL LOW (ref 13.2–17.1)
LYMPHS ABS: 1161 {cells}/uL (ref 850–3900)
Lymphocytes Relative: 27 %
MCH: 31.1 pg (ref 27.0–33.0)
MCHC: 32.7 g/dL (ref 32.0–36.0)
MCV: 95 fL (ref 80.0–100.0)
MONOS PCT: 16 %
MPV: 9.2 fL (ref 7.5–12.5)
Monocytes Absolute: 688 cells/uL (ref 200–950)
NEUTROS ABS: 2322 {cells}/uL (ref 1500–7800)
Neutrophils Relative %: 54 %
PLATELETS: 247 10*3/uL (ref 140–400)
RBC: 3.22 MIL/uL — ABNORMAL LOW (ref 4.20–5.80)
RDW: 15 % (ref 11.0–15.0)
WBC: 4.3 10*3/uL (ref 3.8–10.8)

## 2015-10-03 LAB — PROTIME-INR
INR: 1.14 (ref <1.50)
PROTHROMBIN TIME: 14.7 s (ref 11.6–15.2)

## 2015-10-03 LAB — BASIC METABOLIC PANEL
BUN: 22 mg/dL (ref 7–25)
CALCIUM: 9.5 mg/dL (ref 8.6–10.3)
CO2: 27 mmol/L (ref 20–31)
Chloride: 103 mmol/L (ref 98–110)
Creat: 1.68 mg/dL — ABNORMAL HIGH (ref 0.70–1.18)
Glucose, Bld: 96 mg/dL (ref 65–99)
Potassium: 4.3 mmol/L (ref 3.5–5.3)
SODIUM: 136 mmol/L (ref 135–146)

## 2015-10-03 NOTE — Progress Notes (Signed)
10/03/2015 Robert Gardner   17-Aug-1940  [id number]  Primary Physician Gara Kroner, MD Primary Cardiologist: Dr. Radford Pax Electrophysiologist: Dr. Lovena Le  Reason for Visit/CC: F/u for Abnormal Stress Test  HPI:  75 y.o. male with past medical history of atrial fibrillation, diastolic congestive heart failure, moderate mitral regurgitation, hypertension and atrial flutter. Echo 12/21/2013 showed mild LV dysfunction with EF 45% with HK in the mid to basal inferior/inferoseptal and lateral walls, mild to mod MR, moderately dilated RV with mild to mod reduced RVF and mild pulmonary HTN PASP 107mmHg. He is on Eliquis for atrial flutter. He was seen by Dr. Lovena Le and underwent aflutter ablation.Outpt nuclear stress test 11/2013 with no ischemia.  He was recently seen by Dr. Radford Pax 09/08/15 for surgical clearance prior to undergoing TKR and complained of DOE. Subsequently, she ordered a NST. This was performed 09/23/15. Findings were abnormal with large severe intensity defect in the entire inferior and and inferolateral walls that is fixed c/w prior MI. EF is mildly reduced as well. This is new compared to prior nuclear stress test. The results were reviewed by Dr. Radford Pax, who has recommended LHC to further define his coronary anatomy and potential PCI if obstructive disease.   The patient and his family now present to [facility] to discuss the results and plan for cath. His last dose of Eliquis was yesterday morning. He is currently CP free. No resting dyspnea. VSS.    Current Outpatient Prescriptions  Medication Sig Dispense Refill  . amLODipine (NORVASC) 5 MG tablet TAKE ONE TABLET BY MOUTH ONCE DAILY 180 tablet 3  . apixaban (ELIQUIS) 5 MG TABS tablet Take 1/2 tablet (2.5 mg) twice a day    . furosemide (LASIX) 20 MG tablet Take 1 tablet (20 mg total) by mouth every other day. 15 tablet 11  . Multiple Vitamin (MULTIVITAMIN) capsule Take 1 capsule by mouth daily.    . potassium chloride SA  (K-DUR,KLOR-CON) 20 MEQ tablet Take 1 tablet (20 mEq total) by mouth every other day. Take every other day with Lasix. 15 tablet 11   No current facility-administered medications for this visit.    Allergies  Allergen Reactions  . Carbamazepine     Sister with allergy to Tegretol    Social History   Social History  . Marital Status: Married    Spouse Name: N/A  . Number of Children: N/A  . Years of Education: N/A   Occupational History  . Not on file.   Social History Main Topics  . Smoking status: Former Smoker    Quit date: 12/21/1993  . Smokeless tobacco: Never Used  . Alcohol Use: No  . Drug Use: No  . Sexual Activity: No   Other Topics Concern  . Not on file   Social History Narrative     Review of Systems: General: negative for chills, fever, night sweats or weight changes.  Cardiovascular: negative for chest pain, dyspnea on exertion, edema, orthopnea, palpitations, paroxysmal nocturnal dyspnea or shortness of breath Dermatological: negative for rash Respiratory: negative for cough or wheezing Urologic: negative for hematuria Abdominal: negative for nausea, vomiting, diarrhea, bright red blood per rectum, melena, or hematemesis Neurologic: negative for visual changes, syncope, or dizziness All other systems reviewed and are otherwise negative except as noted above.    Blood pressure 160/60, pulse 66, height 5\' 11"  (1.803 m), weight 165 lb 3.2 oz (74.934 kg).  General appearance: alert, cooperative and no distress Neck: no carotid bruit and no JVD Lungs: clear to  auscultation bilaterally Heart: regular rate and rhythm, S1, S2 normal, no murmur, click, rub or gallop Extremities: no LEE Pulses: 2+ and symmetric Skin: warm and dry Neurologic: Grossly normal  EKG Not performed  ASSESSMENT AND PLAN:   1. Abnormal Stress Test: Findings were abnormal with large severe intensity defect in the entire inferior and and inferolateral walls that is fixed c/w  prior MI. EF is mildly reduced as well. This is new compared to prior nuclear stress test. The results were reviewed by Dr. Radford Pax, who has recommended LHC to further define his coronary anatomy and potential PCI if obstructive disease. We discussed procedural details including potential risk, including death, MI, CVA, bleeding/ vascular complications, renal impairment and allergic reaction to contrast dye. He understands these risk and agrees to proceed. His last dose of Eliquis was yesterday AM. We will plan for [facility] +/- PCI on Wednesday 10/05/15 at [facility] with Dr. Claiborne Billings. He understands to continue to hold Eliquis for procedure.   PLAN  LHC on 10/05/15.   Lyda Jester PA-C 10/03/2015 10:24 AM

## 2015-10-03 NOTE — Patient Instructions (Signed)
Medication Instructions:  Your physician recommends that you continue on your current medications as directed. Please refer to the Current Medication list given to you today.   Labwork: Bmet, Cbc, Pt/Inr  Testing/Procedures: Your physician has requested that you have a cardiac catheterization. Cardiac catheterization is used to diagnose and/or treat various heart conditions. Doctors may recommend this procedure for a number of different reasons. The most common reason is to evaluate chest pain. Chest pain can be a symptom of coronary artery disease (CAD), and cardiac catheterization can show whether plaque is narrowing or blocking your heart's arteries. This procedure is also used to evaluate the valves, as well as measure the blood flow and oxygen levels in different parts of your heart. For further information please visit [email or web address]. Please follow instruction sheet, as given.   Follow-Up: Your physician recommends that you schedule a follow-up appointment pending procedure   Any Other Special Instructions Will Be Listed Below (If Applicable).     If you need a refill on your cardiac medications before your next appointment, please call your pharmacy.

## 2015-10-04 NOTE — Telephone Encounter (Signed)
PFTs scheduled 5/26. [facility] cath scheduled 4/12.  Awaiting catheterization for cardiac clearance.

## 2015-10-05 ENCOUNTER — Encounter (HOSPITAL_COMMUNITY): Payer: Self-pay | Admitting: Cardiovascular Disease

## 2015-10-05 ENCOUNTER — Ambulatory Visit (HOSPITAL_COMMUNITY)
Admission: RE | Admit: 2015-10-05 | Discharge: 2015-10-05 | Disposition: A | Discharge status: Home / Self Care | Payer: 59 | Source: Ambulatory Visit | Attending: Cardiovascular Disease | Admitting: Cardiovascular Disease

## 2015-10-05 ENCOUNTER — Encounter (HOSPITAL_COMMUNITY)
Admission: RE | Disposition: A | Discharge status: Home / Self Care | Payer: Self-pay | Source: Ambulatory Visit | Attending: Cardiovascular Disease

## 2015-10-05 DIAGNOSIS — R9439 Abnormal result of other cardiovascular function study: Secondary | ICD-10-CM | POA: Insufficient documentation

## 2015-10-05 DIAGNOSIS — I4892 Unspecified atrial flutter: Secondary | ICD-10-CM | POA: Insufficient documentation

## 2015-10-05 DIAGNOSIS — I340 Nonrheumatic mitral (valve) insufficiency: Secondary | ICD-10-CM | POA: Diagnosis not present

## 2015-10-05 DIAGNOSIS — I110 Hypertensive heart disease with heart failure: Secondary | ICD-10-CM | POA: Diagnosis not present

## 2015-10-05 DIAGNOSIS — I4891 Unspecified atrial fibrillation: Secondary | ICD-10-CM | POA: Diagnosis not present

## 2015-10-05 DIAGNOSIS — I5032 Chronic diastolic (congestive) heart failure: Secondary | ICD-10-CM | POA: Insufficient documentation

## 2015-10-05 DIAGNOSIS — R931 Abnormal findings on diagnostic imaging of heart and coronary circulation: Secondary | ICD-10-CM | POA: Diagnosis not present

## 2015-10-05 DIAGNOSIS — Z01818 Encounter for other preprocedural examination: Secondary | ICD-10-CM | POA: Insufficient documentation

## 2015-10-05 HISTORY — PX: CARDIAC CATHETERIZATION: SHX172

## 2015-10-05 SURGERY — LEFT HEART CATH AND CORONARY ANGIOGRAPHY

## 2015-10-05 MED ORDER — HEPARIN (PORCINE) IN NACL 2-0.9 UNIT/ML-% IJ SOLN
INTRAMUSCULAR | Status: DC | PRN
  Administered 2015-10-05: 1000 mL

## 2015-10-05 MED ORDER — FENTANYL CITRATE (PF) 100 MCG/2ML IJ SOLN
INTRAMUSCULAR | Status: AC
  Filled 2015-10-05: qty 2

## 2015-10-05 MED ORDER — ONDANSETRON HCL 4 MG/2ML IJ SOLN: 4.0000 mg | Freq: Four times a day (QID) | INTRAMUSCULAR | Status: DC | PRN

## 2015-10-05 MED ORDER — SODIUM CHLORIDE 0.9% FLUSH: 3.0000 mL | Freq: Two times a day (BID) | INTRAVENOUS | Status: DC

## 2015-10-05 MED ORDER — SODIUM CHLORIDE 0.9 % WEIGHT BASED INFUSION: 1.0000 mL/kg/h | INTRAVENOUS | Status: DC

## 2015-10-05 MED ORDER — ASPIRIN 81 MG PO CHEW
81.0000 mg | CHEWABLE_TABLET | ORAL | Status: AC
  Administered 2015-10-05: 81 mg via ORAL

## 2015-10-05 MED ORDER — MIDAZOLAM HCL 2 MG/2ML IJ SOLN
INTRAMUSCULAR | Status: AC
  Filled 2015-10-05: qty 2

## 2015-10-05 MED ORDER — FENTANYL CITRATE (PF) 100 MCG/2ML IJ SOLN
INTRAMUSCULAR | Status: DC | PRN
  Administered 2015-10-05 (×2): 25 ug via INTRAVENOUS

## 2015-10-05 MED ORDER — NITROGLYCERIN 1 MG/10 ML FOR IR/CATH LAB
INTRA_ARTERIAL | Status: AC
  Filled 2015-10-05: qty 10

## 2015-10-05 MED ORDER — IOPAMIDOL (ISOVUE-370) INJECTION 76%
INTRAVENOUS | Status: AC
  Filled 2015-10-05: qty 100

## 2015-10-05 MED ORDER — LIDOCAINE HCL (PF) 1 % IJ SOLN
INTRAMUSCULAR | Status: AC
  Filled 2015-10-05: qty 30

## 2015-10-05 MED ORDER — ASPIRIN 81 MG PO CHEW
CHEWABLE_TABLET | ORAL | Status: AC
  Administered 2015-10-05: 81 mg via ORAL
  Filled 2015-10-05: qty 1

## 2015-10-05 MED ORDER — SODIUM CHLORIDE 0.9 % WEIGHT BASED INFUSION
3.0000 mL/kg/h | INTRAVENOUS | Status: AC
  Administered 2015-10-05: 3 mL/kg/h via INTRAVENOUS

## 2015-10-05 MED ORDER — SODIUM CHLORIDE 0.9% FLUSH: 3.0000 mL | INTRAVENOUS | Status: DC | PRN

## 2015-10-05 MED ORDER — VERAPAMIL HCL 2.5 MG/ML IV SOLN
INTRAVENOUS | Status: AC
  Filled 2015-10-05: qty 2

## 2015-10-05 MED ORDER — HEPARIN (PORCINE) IN NACL 2-0.9 UNIT/ML-% IJ SOLN
INTRAMUSCULAR | Status: AC
  Filled 2015-10-05: qty 1000

## 2015-10-05 MED ORDER — DIAZEPAM 5 MG PO TABS: 5.0000 mg | ORAL_TABLET | ORAL | Status: DC | PRN

## 2015-10-05 MED ORDER — HEPARIN SODIUM (PORCINE) 1000 UNIT/ML IJ SOLN
INTRAMUSCULAR | Status: AC
  Filled 2015-10-05: qty 1

## 2015-10-05 MED ORDER — MIDAZOLAM HCL 2 MG/2ML IJ SOLN
INTRAMUSCULAR | Status: DC | PRN
  Administered 2015-10-05: 2 mg via INTRAVENOUS
  Administered 2015-10-05: 1 mg via INTRAVENOUS

## 2015-10-05 MED ORDER — ACETAMINOPHEN 325 MG PO TABS: 650.0000 mg | ORAL_TABLET | ORAL | Status: DC | PRN

## 2015-10-05 MED ORDER — IOPAMIDOL (ISOVUE-370) INJECTION 76%
INTRAVENOUS | Status: DC | PRN
  Administered 2015-10-05: 35 mL via INTRA_ARTERIAL

## 2015-10-05 MED ORDER — SODIUM CHLORIDE 0.9 % IV SOLN: INTRAVENOUS | Status: AC

## 2015-10-05 MED ORDER — SODIUM CHLORIDE 0.9 % IV SOLN: 250.0000 mL | INTRAVENOUS | Status: DC | PRN

## 2015-10-05 MED ORDER — LIDOCAINE HCL (PF) 1 % IJ SOLN
INTRAMUSCULAR | Status: DC | PRN
  Administered 2015-10-05: 5 mL via INTRADERMAL
  Administered 2015-10-05: 17 mL

## 2015-10-05 SURGICAL SUPPLY — 10 items
CATH INFINITI 5FR MULTPACK ANG (CATHETERS) ×2 IMPLANT
GLIDESHEATH SLEND A-KIT 6F 22G (SHEATH) ×2 IMPLANT
KIT HEART LEFT (KITS) ×2 IMPLANT
NEEDLE PERC 21GX4CM (NEEDLE) ×2 IMPLANT
PACK CARDIAC CATHETERIZATION (CUSTOM PROCEDURE TRAY) ×2 IMPLANT
SHEATH PINNACLE 5F 10CM (SHEATH) ×2 IMPLANT
TRANSDUCER W/STOPCOCK (MISCELLANEOUS) ×2 IMPLANT
TUBING CIL FLEX 10 FLL-RA (TUBING) ×2 IMPLANT
WIRE EMERALD 3MM-J .035X150CM (WIRE) ×2 IMPLANT
WIRE MICROINTRODUCER 60CM (WIRE) ×2 IMPLANT

## 2015-10-05 NOTE — Progress Notes (Signed)
Site area: rt groin Site Prior to Removal:  Level 0 Pressure Applied For:  25 minutes  Manual:   yes Patient Status During Pull:  stable Post Pull Site:  Level  0 Post Pull Instructions Given:  yes Post Pull Pulses Present:  yes Dressing Applied:  tegaderm Bedrest begins @  [id number] Comments:

## 2015-10-05 NOTE — Discharge Instructions (Signed)
Angiogram, Care After °Refer to this sheet in the next few weeks. These instructions provide you with information about caring for yourself after your procedure. Your health care provider may also give you more specific instructions. Your treatment has been planned according to current medical practices, but problems sometimes occur. Call your health care provider if you have any problems or questions after your procedure. °WHAT TO EXPECT AFTER THE PROCEDURE °After your procedure, it is typical to have the following: °· Bruising at the catheter insertion site that usually fades within 1-2 weeks. °· Blood collecting in the tissue (hematoma) that may be painful to the touch. It should usually decrease in size and tenderness within 1-2 weeks. °HOME CARE INSTRUCTIONS °· Take medicines only as directed by your health care provider. °· You may shower 24-48 hours after the procedure or as directed by your health care provider. Remove the bandage (dressing) and gently wash the site with plain soap and water. Pat the area dry with a clean towel. Do not rub the site, because this may cause bleeding. °· Do not take baths, swim, or use a hot tub until your health care provider approves. °· Check your insertion site every day for redness, swelling, or drainage. °· Do not apply powder or lotion to the site. °· Do not lift over 10 lb (4.5 kg) for 5 days after your procedure or as directed by your health care provider. °· Ask your health care provider when it is okay to: °¨ Return to work or school. °¨ Resume usual physical activities or sports. °¨ Resume sexual activity. °· Do not drive home if you are discharged the same day as the procedure. Have someone else drive you. °· You may drive 24 hours after the procedure unless otherwise instructed by your health care provider. °· Do not operate machinery or power tools for 24 hours after the procedure or as directed by your health care provider. °· If your procedure was done as an  outpatient procedure, which means that you went home the same day as your procedure, a responsible adult should be with you for the first 24 hours after you arrive home. °· Keep all follow-up visits as directed by your health care provider. This is important. °SEEK MEDICAL CARE IF: °· You have a fever. °· You have chills. °· You have increased bleeding from the catheter insertion site. Hold pressure on the site. °SEEK IMMEDIATE MEDICAL CARE IF: °· You have unusual pain at the catheter insertion site. °· You have redness, warmth, or swelling at the catheter insertion site. °· You have drainage (other than a small amount of blood on the dressing) from the catheter insertion site. °· The catheter insertion site is bleeding, and the bleeding does not stop after 30 minutes of holding steady pressure on the site. °· The area near or just beyond the catheter insertion site becomes pale, cool, tingly, or numb. °  °This information is not intended to replace advice given to you by your health care provider. Make sure you discuss any questions you have with your health care provider. °  °Document Released: 12/28/2004 Document Revised: 07/02/2014 Document Reviewed: 11/12/2012 °Elsevier Interactive Patient Education ©2016 [employer] ° °

## 2015-10-06 ENCOUNTER — Telehealth: Payer: Self-pay | Admitting: Cardiology

## 2015-10-06 MED FILL — Verapamil HCl IV Soln 2.5 MG/ML: INTRAVENOUS | Qty: 2 | Status: AC

## 2015-10-06 MED FILL — Nitroglycerin IV Soln 100 MCG/ML in D5W: INTRA_ARTERIAL | Qty: 10 | Status: AC

## 2015-10-06 NOTE — Telephone Encounter (Signed)
Needs to hold Eliquis 48 hours prior to his surgery

## 2015-10-06 NOTE — Telephone Encounter (Signed)
Patient is at moderate risk from a cardiac standpoint due to his moderate pulmonary HTN and moderate MR as well as mild LV dysfunction that increase his risk of volume overload and CHF in the periop period.  He has minimal CAD by cath.  Would be cautious with fluid overload.

## 2015-10-06 NOTE — Telephone Encounter (Signed)
New message  Pt wife called states that the CATH was completed with no signs of blockage. Wife is requesting clearance for the surgery.  Request for surgical clearance:  What type of surgery is being performed? Knee replacements When is this surgery scheduled? Not scheduled yet until they hear from Dr. Radford Pax    Are there any medications that need to be held prior to surgery and how long? Not sure    Please call back to discuss.

## 2015-10-06 NOTE — Telephone Encounter (Signed)
New message  Pt wife called states that the CATH was completed with no signs of blockage. Wife is requesting clearance for the surgery.  Request for surgical clearance:  What type of surgery is being performed? Knee replacements When is this surgery scheduled? Not scheduled yet until they hear from Dr. Radford Pax   Are there any medications that need to be held prior to surgery and how long? Not sure    Please call back to discuss.

## 2015-10-07 NOTE — Telephone Encounter (Signed)
Called patient, advised of the need to hold Eliquis for 48hrs prior to his surgery.  To have the surgeons office to request a clearance from our office. Patient says that he will.

## 2015-10-10 ENCOUNTER — Telehealth: Payer: Self-pay | Admitting: Cardiology

## 2015-10-10 NOTE — Telephone Encounter (Signed)
Faxed 4/13 phone note with instructions/clearance to [facility]. Notified Juliann Pulse and instructed her to call with further questions or concerns.

## 2015-10-10 NOTE — Telephone Encounter (Signed)
New message      Request for surgical clearance:  1. What type of surgery is being performed? Rt total knee  When is this surgery scheduled? Not scheduled 2. Are there any medications that need to be held prior to surgery and how long? Hold eliquis 48hrs prior to surgery  3. Name of physician performing surgery?  Dr Mayer Camel 4. What is your office phone and fax number?  Fax  [phone] 5. Ov note in epic states pt is "moderate risk" for surgery.  Is this her clearance.  Please fax "official" clearance

## 2015-10-12 ENCOUNTER — Telehealth: Payer: Self-pay | Admitting: Cardiology

## 2015-10-12 ENCOUNTER — Other Ambulatory Visit: Payer: Self-pay | Admitting: Orthopedic Surgery

## 2015-10-12 NOTE — Telephone Encounter (Signed)
Spoke with pt and wife and informed them that Dr. Radford Pax did want pt to continue with monitor. Both verbalized understanding and were in agreement with this plan.

## 2015-10-12 NOTE — Telephone Encounter (Signed)
Needs to continue to wear it

## 2015-10-12 NOTE — Telephone Encounter (Signed)
Pt's wife calling re heart monitor pt got 09-23-15-wants to know how long he is to wear it? Had heart cath last week and no blockages were seen, so wondering if monitor still needed-pls advise

## 2015-10-12 NOTE — Telephone Encounter (Signed)
Monitor placed 3/31, due to take off on 5/1.  Pt had cath on 4/12.  Wants to know if he needs to continue to wear it or if ok to go ahead and take it off?

## 2015-10-21 ENCOUNTER — Ambulatory Visit (HOSPITAL_COMMUNITY)
Admission: RE | Admit: 2015-10-21 | Discharge: 2015-10-21 | Disposition: A | Discharge status: Home / Self Care | Payer: 59 | Source: Ambulatory Visit | Attending: Orthopedic Surgery | Admitting: Orthopedic Surgery

## 2015-10-21 ENCOUNTER — Other Ambulatory Visit: Payer: Self-pay | Admitting: Cardiology

## 2015-10-21 ENCOUNTER — Encounter (HOSPITAL_COMMUNITY)
Admission: RE | Admit: 2015-10-21 | Discharge: 2015-10-21 | Disposition: A | Discharge status: Home / Self Care | Payer: 59 | Source: Ambulatory Visit | Attending: Orthopedic Surgery | Admitting: Orthopedic Surgery

## 2015-10-21 ENCOUNTER — Encounter (HOSPITAL_COMMUNITY): Payer: Self-pay

## 2015-10-21 DIAGNOSIS — Z01818 Encounter for other preprocedural examination: Secondary | ICD-10-CM | POA: Insufficient documentation

## 2015-10-21 HISTORY — DX: Malignant (primary) neoplasm, unspecified: C80.1

## 2015-10-21 HISTORY — DX: Unspecified osteoarthritis, unspecified site: M19.90

## 2015-10-21 LAB — CBC WITH DIFFERENTIAL/PLATELET
BASOS PCT: 0 %
Basophils Absolute: 0 10*3/uL (ref 0.0–0.1)
EOS ABS: 0.1 10*3/uL (ref 0.0–0.7)
Eosinophils Relative: 2 %
HCT: 32.1 % — ABNORMAL LOW (ref 39.0–52.0)
HEMOGLOBIN: 10.5 g/dL — AB (ref 13.0–17.0)
LYMPHS PCT: 36 %
Lymphs Abs: 1.5 10*3/uL (ref 0.7–4.0)
MCH: 31.7 pg (ref 26.0–34.0)
MCHC: 32.7 g/dL (ref 30.0–36.0)
MCV: 97 fL (ref 78.0–100.0)
MONO ABS: 0.5 10*3/uL (ref 0.1–1.0)
Monocytes Relative: 13 %
NEUTROS PCT: 49 %
Neutro Abs: 2.1 10*3/uL (ref 1.7–7.7)
PLATELETS: 248 10*3/uL (ref 150–400)
RBC: 3.31 MIL/uL — AB (ref 4.22–5.81)
RDW: 14.1 % (ref 11.5–15.5)
WBC: 4.2 10*3/uL (ref 4.0–10.5)

## 2015-10-21 LAB — BASIC METABOLIC PANEL
ANION GAP: 8 (ref 5–15)
BUN: 22 mg/dL — ABNORMAL HIGH (ref 6–20)
CALCIUM: 9.4 mg/dL (ref 8.9–10.3)
CHLORIDE: 107 mmol/L (ref 101–111)
CO2: 23 mmol/L (ref 22–32)
CREATININE: 1.84 mg/dL — AB (ref 0.61–1.24)
GFR calc non Af Amer: 35 mL/min — ABNORMAL LOW (ref >60)
GFR, EST AFRICAN AMERICAN: 41 mL/min — AB (ref >60)
Glucose, Bld: 89 mg/dL (ref 65–99)
Potassium: 4.2 mmol/L (ref 3.5–5.1)
SODIUM: 138 mmol/L (ref 135–145)

## 2015-10-21 LAB — URINALYSIS, ROUTINE W REFLEX MICROSCOPIC
BILIRUBIN URINE: NEGATIVE
Glucose, UA: NEGATIVE mg/dL
Ketones, ur: NEGATIVE mg/dL
LEUKOCYTES UA: NEGATIVE
NITRITE: NEGATIVE
Protein, ur: NEGATIVE mg/dL
SPECIFIC GRAVITY, URINE: 1.006 (ref 1.005–1.030)
pH: 6 (ref 5.0–8.0)

## 2015-10-21 LAB — APTT: aPTT: 37 seconds (ref 24–37)

## 2015-10-21 LAB — URINE MICROSCOPIC-ADD ON

## 2015-10-21 LAB — PROTIME-INR
INR: 1.29 (ref 0.00–1.49)
PROTHROMBIN TIME: 16.2 s — AB (ref 11.6–15.2)

## 2015-10-21 LAB — SURGICAL PCR SCREEN
MRSA, PCR: NEGATIVE
Staphylococcus aureus: NEGATIVE

## 2015-10-21 LAB — ABO/RH: ABO/RH(D): B POS

## 2015-10-21 NOTE — Pre-Procedure Instructions (Signed)
    Robert Gardner  10/21/2015      Your procedure is scheduled on Monday, May 8.  Report to [facility] Admitting at 8:15 A.M.                Your surgery or procedure is scheduled for 10:15 PM   Call this number if you have problems the morning of surgery: [phone]   Remember:  Do not eat food or drink liquids after midnight Sunday, May 7.  Take these medicines the morning of surgery with A SIP OF WATER:   AmLODipine.  May take Tylenol if needed.                   Stop Vitamins Monday, May 1.                    Hold Apixaban (Eliqus) 49 hours prior to surgery.   Do not wear jewelry, make-up or nail polish.  Do not wear lotions, powders, or perfumes.     Men may shave face and neck.  Do not bring valuables to the hospital.  [facility] is not responsible for any belongings or valuables.  Contacts, dentures or bridgework may not be worn into surgery.  Leave your suitcase in the car.  After surgery it may be brought to your room.  For patients admitted to the hospital, discharge time will be determined by your treatment team.  Special instructions:  Review  [place] - Preparing For Surgery.  Please read over the following fact sheets that you were given. Pain Booklet, Coughing and Deep Breathing, Blood Transfusion Information, MRSA Information and Surgical Site Infection Prevention and Incentive Spirometry

## 2015-10-21 NOTE — Progress Notes (Signed)
Robert Gardner denies chest pain, only short of breath with exertion.  Robert Gardner has been wearing a cardiac monitor, he was suppose to wear it until 10/24/15, but he removed i ttoday to turn it in.

## 2015-10-24 NOTE — Progress Notes (Addendum)
Anesthesia Chart Review:  Pt is a 75 year old male scheduled for R total knee arthroplasty on 10/31/2015 with Dr. Mayer Camel.   PCP is Dr. Antony Contras. Cardiologist is Dr. Fransico Him. EP cardiologist is Dr. Lovena Le.   PMH includes:  CHF, Mobitz type I (Wenckebach) 2nd degree AV block, HTN, atrial flutter (s/p ablation 2015), moderate mitral regurgitation, CKD, anemia, melanoma. Former smoker. BMI 23  Medications include: amlodipine, eliquis, lasix, potassium.  I confirmed with Dr. Radford Pax that pt could stop eliquis 3 days prior to surgery and notified pt. Last dose will be 10/27/15.   Preoperative labs reviewed.   - Cr 1.84, BUN 22. Cr consistent with prior results.  - PT 16.2. Will recheck DOS.   Chest x-ray 10/21/15 reviewed. : No active cardiopulmonary disease.   EKG 09/08/15: sinus rhythm with PACs. T wave abnormality, consider lateral ischemia. Prolonged QT  Cardiac cath 10/05/15 (for abnormal stress test):  -No significant coronary obstructive disease with only minimal 10% luminal irregularity of the mid RCA proximal to the region of the acute margin. - Probable nonischemic cardiopathy with previous noninvasive assessment of LV function at 40-45% ejection fraction. LVEDP today was 26 mm Hg.  Echo 09/23/15:  - Left ventricle: The cavity size was normal. There was mild concentric hypertrophy. Systolic function was mildly reduced. The estimated ejection fraction was in the range of 45% to 50%. Wall motion was normal; there were no regional wall motion abnormalities. - Aortic valve: There was trivial regurgitation. - Mitral valve: There was moderate regurgitation. - Right ventricle: The cavity size was mildly dilated. Wall thickness was normal. - Pulmonic valve: There was trivial regurgitation. - Pulmonary arteries: PA peak pressure: 58 mm Hg (S). - Impressions: Mildly reduced LVF with EF 45%, moderate MR, mildly thickened MV leaflets, mildly thickened AV leaflets, moderate pulmonary HTN.  Compared to prior study, PASP has increased from 36mmHg to 80mmHg. The right ventricular systolic pressure was increased consistent with moderate pulmonary hypertension.  Pt turned in cardiac monitor 10/24/15. Awaiting report, but Dr. Radford Pax has cleared pt without monitor report. Dr. Radford Pax notes "Patient is at moderate risk from a cardiac standpoint due to his moderate pulmonary HTN and moderate MR as well as mild LV dysfunction that increase his risk of volume overload and CHF in the periop period. He has minimal CAD by cath. Would be cautious with fluid overload."  If no changes, I anticipate pt can proceed with surgery as scheduled.   Willeen Cass, FNP-BC [facility]/Anesthesiology Phone: [phone] 10/27/2015 1:17 PM

## 2015-10-26 ENCOUNTER — Telehealth: Payer: Self-pay | Admitting: Cardiology

## 2015-10-26 NOTE — Telephone Encounter (Signed)
3 days is fine to hold Eliquis

## 2015-10-26 NOTE — Telephone Encounter (Signed)
Levada Dy called in stating that the pt is scheduled to knee surgery on 5/8 and asked if the pt could hold his Eliquis for 3 days prior as oppose to 2 day. She says if Dr. Radford Pax could respond in [place], that would be fine.

## 2015-10-27 NOTE — Telephone Encounter (Signed)
To Willeen Cass.

## 2015-10-28 NOTE — H&P (Signed)
TOTAL KNEE ADMISSION H&P  Patient is being admitted for right total knee arthroplasty.  Subjective:  Chief Complaint:right knee pain.  HPI: Robert Gardner, 75 y.o. male, has a history of pain and functional disability in the right knee due to arthritis and has failed non-surgical conservative treatments for greater than 12 weeks to includeNSAID's and/or analgesics, flexibility and strengthening excercises, use of assistive devices, weight reduction as appropriate and activity modification.  Onset of symptoms was gradual, starting 1 years ago with gradually worsening course since that time. The patient noted no past surgery on the right knee(s).  Patient currently rates pain in the right knee(s) at 10 out of 10 with activity. Patient has night pain, worsening of pain with activity and weight bearing, pain that interferes with activities of daily living, pain with passive range of motion and crepitus.  Patient has evidence of subchondral sclerosis, periarticular osteophytes and joint space narrowing by imaging studies.   There is no active infection.  Patient Active Problem List   Diagnosis Date Noted  . Abnormal nuclear stress test   . Preoperative clearance   . Mitral regurgitation 09/08/2015  . Pulmonary hypertension ([place]) 09/08/2015  . Second degree AV block, Mobitz type I 09/08/2015  . Chronic combined systolic and diastolic CHF (congestive heart failure) ([place]) 12/21/2013  . Bradycardia 10/20/2013  . Congestive dilated cardiomyopathy ([place]) 10/20/2013  . Atrial flutter ([place]) 10/09/2013  . Hypertension   . Bell palsy   . Erectile dysfunction   . Atrial fibrillation ([place])   . Seizure [facility])    Past Medical History  Diagnosis Date  . Hypertension   . Combined systolic and diastolic heart failure ([place])     EF 45% by echo 08/2014.  Marland Kitchen Bell palsy   . Erectile dysfunction   . Mobitz (type) I (Wenckebach's) atrioventricular block     a. By Holter 10/2012.  . Mitral regurgitation    Moderate by echo 08/2014  . Atrial flutter ([place])     s/p flutter ablation  . CKD (chronic kidney disease), stage II   . Normocytic anemia   . Thyroid enlargement     a. Abnl xray 09/2013 - possible R thyroid enlargement.   . Second degree AV block, Mobitz type I 09/08/2015  . CHF (congestive heart failure) ([place])   . Shortness of breath dyspnea     with exertion   . Arthritis   . Cancer [facility])     skin cancer right leg-melanoma    Past Surgical History  Procedure Laterality Date  . Resection of melanoma Right     Leg  . Ablation  11-11-2013    CTI ablation by Dr Lovena Le  . Atrial flutter ablation N/A 11/11/2013    Procedure: ATRIAL FLUTTER ABLATION;  Surgeon: Evans Lance, MD;  Location: [facility] CATH LAB;  Service: Cardiovascular;  Laterality: N/A;  . Cardiac catheterization N/A 10/05/2015    Procedure: Left Heart Cath and Coronary Angiography;  Surgeon: Troy Sine, MD;  Location: [place] CV LAB;  Service: Cardiovascular;  Laterality: N/A;  . Rectal tear      repair  . Cyst removal trunk      like a cyst    No prescriptions prior to admission   No Active Allergies  Social History  Substance Use Topics  . Smoking status: Former Smoker -- 23 years    Quit date: 12/21/1993  . Smokeless tobacco: Never Used  . Alcohol Use: No    Family History  Problem Relation Age  of Onset  . Heart disease Mother     unclear details  . Heart disease Father     unclear details  . Heart disease Sister     unclear details     Review of Systems  Constitutional: Negative.   HENT: Negative.   Eyes: Negative.   Respiratory: Positive for shortness of breath.   Cardiovascular:       HTN, Irregular heart beat,   Gastrointestinal: Negative.   Genitourinary: Negative.   Musculoskeletal: Positive for joint pain.  Skin: Negative.   Neurological: Negative.   Endo/Heme/Allergies: Negative.   Psychiatric/Behavioral: Negative.     Objective:  Physical Exam  Constitutional: He is oriented to  person, place, and time. He appears well-developed and well-nourished.  HENT:  Head: Normocephalic and atraumatic.  Eyes: Pupils are equal, round, and reactive to light.  Neck: Normal range of motion. Neck supple.  Cardiovascular: Intact distal pulses.   Respiratory: Effort normal.  Musculoskeletal: He exhibits tenderness.  the patient's right knee has good strength but does have reduced range of motion.  He has a 10 flexion contracture.  He has severe pain with flexion over 110.  Tenderness over the medial joint line with obvious bony deformities medially.  No instability.  His calves are soft and nontender.  He does have obvious crepitance with range of motion.  No noticeable effusion today.  Neurological: He is alert and oriented to person, place, and time.  Skin: Skin is warm and dry.  Psychiatric: He has a normal mood and affect. His behavior is normal. Judgment and thought content normal.    Vital signs in last 24 hours:    Labs:   Estimated body mass index is 23.02 kg/(m^2) as calculated from the following:   Height as of 10/05/15: 5\' 11"  (1.803 m).   Weight as of 10/05/15: 74.844 kg (165 lb).   Imaging Review Plain radiographs demonstrate end-stage bone-on-bone arthritis of the medial compartment with dishing of the medial tibial plateau.  He has periarticular osteophyte formation and subchondral sclerosis.    Assessment/Plan:  End stage arthritis, right knee   The patient history, physical examination, clinical judgment of the provider and imaging studies are consistent with end stage degenerative joint disease of the right knee(s) and total knee arthroplasty is deemed medically necessary. The treatment options including medical management, injection therapy arthroscopy and arthroplasty were discussed at length. The risks and benefits of total knee arthroplasty were presented and reviewed. The risks due to aseptic loosening, infection, stiffness, patella tracking problems,  thromboembolic complications and other imponderables were discussed. The patient acknowledged the explanation, agreed to proceed with the plan and consent was signed. Patient is being admitted for inpatient treatment for surgery, pain control, PT, OT, prophylactic antibiotics, VTE prophylaxis, progressive ambulation and ADL's and discharge planning. The patient is planning to be discharged home with home health services

## 2015-10-30 DIAGNOSIS — I441 Atrioventricular block, second degree: Secondary | ICD-10-CM | POA: Insufficient documentation

## 2015-10-30 DIAGNOSIS — M1711 Unilateral primary osteoarthritis, right knee: Secondary | ICD-10-CM | POA: Diagnosis present

## 2015-10-31 ENCOUNTER — Encounter (HOSPITAL_COMMUNITY)
Admission: RE | Disposition: A | Discharge status: Home Health | Payer: Self-pay | Source: Ambulatory Visit | Attending: Orthopedic Surgery

## 2015-10-31 ENCOUNTER — Inpatient Hospital Stay (HOSPITAL_COMMUNITY): Payer: 59 | Admitting: Emergency Medicine

## 2015-10-31 ENCOUNTER — Encounter (HOSPITAL_COMMUNITY): Payer: Self-pay | Admitting: Certified Registered Nurse Anesthetist

## 2015-10-31 ENCOUNTER — Inpatient Hospital Stay (HOSPITAL_COMMUNITY): Payer: 59 | Admitting: Certified Registered Nurse Anesthetist

## 2015-10-31 ENCOUNTER — Inpatient Hospital Stay (HOSPITAL_COMMUNITY)
Admission: RE | Admit: 2015-10-31 | Discharge: 2015-11-04 | DRG: 470 | Disposition: A | Discharge status: Home Health | Payer: 59 | Source: Ambulatory Visit | Attending: Orthopedic Surgery | Admitting: Orthopedic Surgery

## 2015-10-31 DIAGNOSIS — I441 Atrioventricular block, second degree: Secondary | ICD-10-CM | POA: Diagnosis present

## 2015-10-31 DIAGNOSIS — I4891 Unspecified atrial fibrillation: Secondary | ICD-10-CM | POA: Diagnosis present

## 2015-10-31 DIAGNOSIS — N183 Chronic kidney disease, stage 3 unspecified: Secondary | ICD-10-CM | POA: Diagnosis present

## 2015-10-31 DIAGNOSIS — I130 Hypertensive heart and chronic kidney disease with heart failure and stage 1 through stage 4 chronic kidney disease, or unspecified chronic kidney disease: Secondary | ICD-10-CM | POA: Diagnosis present

## 2015-10-31 DIAGNOSIS — I248 Other forms of acute ischemic heart disease: Secondary | ICD-10-CM | POA: Diagnosis not present

## 2015-10-31 DIAGNOSIS — Z85820 Personal history of malignant melanoma of skin: Secondary | ICD-10-CM

## 2015-10-31 DIAGNOSIS — I252 Old myocardial infarction: Secondary | ICD-10-CM | POA: Diagnosis not present

## 2015-10-31 DIAGNOSIS — D62 Acute posthemorrhagic anemia: Secondary | ICD-10-CM | POA: Diagnosis not present

## 2015-10-31 DIAGNOSIS — D696 Thrombocytopenia, unspecified: Secondary | ICD-10-CM | POA: Diagnosis not present

## 2015-10-31 DIAGNOSIS — R001 Bradycardia, unspecified: Secondary | ICD-10-CM | POA: Diagnosis not present

## 2015-10-31 DIAGNOSIS — D649 Anemia, unspecified: Secondary | ICD-10-CM | POA: Diagnosis present

## 2015-10-31 DIAGNOSIS — I10 Essential (primary) hypertension: Secondary | ICD-10-CM | POA: Diagnosis present

## 2015-10-31 DIAGNOSIS — Z87891 Personal history of nicotine dependence: Secondary | ICD-10-CM | POA: Diagnosis not present

## 2015-10-31 DIAGNOSIS — R079 Chest pain, unspecified: Secondary | ICD-10-CM | POA: Insufficient documentation

## 2015-10-31 DIAGNOSIS — I480 Paroxysmal atrial fibrillation: Secondary | ICD-10-CM | POA: Diagnosis not present

## 2015-10-31 DIAGNOSIS — I420 Dilated cardiomyopathy: Secondary | ICD-10-CM | POA: Diagnosis present

## 2015-10-31 DIAGNOSIS — I5042 Chronic combined systolic (congestive) and diastolic (congestive) heart failure: Secondary | ICD-10-CM | POA: Diagnosis present

## 2015-10-31 DIAGNOSIS — I272 Other secondary pulmonary hypertension: Secondary | ICD-10-CM | POA: Diagnosis present

## 2015-10-31 DIAGNOSIS — Z8249 Family history of ischemic heart disease and other diseases of the circulatory system: Secondary | ICD-10-CM

## 2015-10-31 DIAGNOSIS — I4819 Other persistent atrial fibrillation: Secondary | ICD-10-CM | POA: Diagnosis present

## 2015-10-31 DIAGNOSIS — M25561 Pain in right knee: Secondary | ICD-10-CM | POA: Diagnosis present

## 2015-10-31 DIAGNOSIS — I340 Nonrheumatic mitral (valve) insufficiency: Secondary | ICD-10-CM | POA: Diagnosis present

## 2015-10-31 DIAGNOSIS — M1711 Unilateral primary osteoarthritis, right knee: Secondary | ICD-10-CM | POA: Diagnosis present

## 2015-10-31 HISTORY — PX: TOTAL KNEE ARTHROPLASTY: SHX125

## 2015-10-31 LAB — PROTIME-INR
INR: 1.09 (ref 0.00–1.49)
Prothrombin Time: 14.3 seconds (ref 11.6–15.2)

## 2015-10-31 SURGERY — ARTHROPLASTY, KNEE, TOTAL
Anesthesia: Spinal | Site: Knee | Laterality: Right

## 2015-10-31 MED ORDER — MIDAZOLAM HCL 5 MG/5ML IJ SOLN
INTRAMUSCULAR | Status: DC | PRN
  Administered 2015-10-31: 1 mg via INTRAVENOUS

## 2015-10-31 MED ORDER — BISACODYL 5 MG PO TBEC
5.0000 mg | DELAYED_RELEASE_TABLET | Freq: Every day | ORAL | Status: DC | PRN
  Administered 2015-11-02: 5 mg via ORAL
  Filled 2015-10-31: qty 1

## 2015-10-31 MED ORDER — KCL IN DEXTROSE-NACL 20-5-0.45 MEQ/L-%-% IV SOLN
INTRAVENOUS | Status: DC
  Administered 2015-10-31 – 2015-11-01 (×2): 125 mL/h via INTRAVENOUS
  Administered 2015-11-03: via INTRAVENOUS
  Filled 2015-10-31 (×4): qty 1000

## 2015-10-31 MED ORDER — LACTATED RINGERS IV SOLN
INTRAVENOUS | Status: DC
  Administered 2015-10-31 (×2): via INTRAVENOUS

## 2015-10-31 MED ORDER — DIPHENHYDRAMINE HCL 12.5 MG/5ML PO ELIX: 12.5000 mg | ORAL_SOLUTION | ORAL | Status: DC | PRN

## 2015-10-31 MED ORDER — MENTHOL 3 MG MT LOZG
1.0000 | LOZENGE | OROMUCOSAL | Status: DC | PRN
  Administered 2015-10-31: 3 mg via ORAL
  Filled 2015-10-31: qty 9

## 2015-10-31 MED ORDER — DOCUSATE SODIUM 100 MG PO CAPS
100.0000 mg | ORAL_CAPSULE | Freq: Two times a day (BID) | ORAL | Status: DC
  Administered 2015-10-31 – 2015-11-04 (×8): 100 mg via ORAL
  Filled 2015-10-31 (×8): qty 1

## 2015-10-31 MED ORDER — SODIUM CHLORIDE 0.9 % IR SOLN
Status: DC | PRN
  Administered 2015-10-31: 1000 mL

## 2015-10-31 MED ORDER — APIXABAN 2.5 MG PO TABS
2.5000 mg | ORAL_TABLET | Freq: Two times a day (BID) | ORAL | Status: DC
  Administered 2015-11-01 – 2015-11-04 (×7): 2.5 mg via ORAL
  Filled 2015-10-31 (×7): qty 1

## 2015-10-31 MED ORDER — METOCLOPRAMIDE HCL 5 MG PO TABS: 5.0000 mg | ORAL_TABLET | Freq: Three times a day (TID) | ORAL | Status: DC | PRN

## 2015-10-31 MED ORDER — ONDANSETRON HCL 4 MG/2ML IJ SOLN: 4.0000 mg | Freq: Four times a day (QID) | INTRAMUSCULAR | Status: DC | PRN

## 2015-10-31 MED ORDER — MIDAZOLAM HCL 2 MG/2ML IJ SOLN
INTRAMUSCULAR | Status: AC
  Filled 2015-10-31: qty 2

## 2015-10-31 MED ORDER — CEFAZOLIN SODIUM-DEXTROSE 2-4 GM/100ML-% IV SOLN
2.0000 g | INTRAVENOUS | Status: AC
  Administered 2015-10-31: 2 g via INTRAVENOUS
  Filled 2015-10-31: qty 100

## 2015-10-31 MED ORDER — FUROSEMIDE 20 MG PO TABS
20.0000 mg | ORAL_TABLET | ORAL | Status: DC
  Administered 2015-10-31 – 2015-11-04 (×3): 20 mg via ORAL
  Filled 2015-10-31 (×3): qty 1

## 2015-10-31 MED ORDER — PROPOFOL 500 MG/50ML IV EMUL
INTRAVENOUS | Status: DC | PRN
  Administered 2015-10-31: 50 ug/kg/min via INTRAVENOUS

## 2015-10-31 MED ORDER — SENNOSIDES-DOCUSATE SODIUM 8.6-50 MG PO TABS: 1.0000 | ORAL_TABLET | Freq: Every evening | ORAL | Status: DC | PRN

## 2015-10-31 MED ORDER — OXYCODONE HCL 5 MG PO TABS
5.0000 mg | ORAL_TABLET | ORAL | Status: DC | PRN
  Administered 2015-10-31 – 2015-11-01 (×2): 10 mg via ORAL
  Administered 2015-11-01: 9 mg via ORAL
  Administered 2015-11-01 – 2015-11-04 (×7): 10 mg via ORAL
  Filled 2015-10-31 (×10): qty 2

## 2015-10-31 MED ORDER — ACETAMINOPHEN 325 MG PO TABS
650.0000 mg | ORAL_TABLET | Freq: Four times a day (QID) | ORAL | Status: DC | PRN
  Administered 2015-11-02 – 2015-11-03 (×4): 650 mg via ORAL
  Filled 2015-10-31 (×5): qty 2

## 2015-10-31 MED ORDER — CHLORHEXIDINE GLUCONATE 4 % EX LIQD: 60.0000 mL | Freq: Once | CUTANEOUS | Status: DC

## 2015-10-31 MED ORDER — BUPIVACAINE IN DEXTROSE 0.75-8.25 % IT SOLN
INTRATHECAL | Status: DC | PRN
  Administered 2015-10-31: 2 mL via INTRATHECAL

## 2015-10-31 MED ORDER — POTASSIUM CHLORIDE CRYS ER 20 MEQ PO TBCR
20.0000 meq | EXTENDED_RELEASE_TABLET | ORAL | Status: DC
  Administered 2015-10-31 – 2015-11-04 (×3): 20 meq via ORAL
  Filled 2015-10-31 (×3): qty 1

## 2015-10-31 MED ORDER — MEPERIDINE HCL 25 MG/ML IJ SOLN: 6.2500 mg | INTRAMUSCULAR | Status: DC | PRN

## 2015-10-31 MED ORDER — DEXTROSE-NACL 5-0.45 % IV SOLN: INTRAVENOUS | Status: DC

## 2015-10-31 MED ORDER — BUPIVACAINE LIPOSOME 1.3 % IJ SUSP
INTRAMUSCULAR | Status: DC | PRN
  Administered 2015-10-31: 20 mL

## 2015-10-31 MED ORDER — METHOCARBAMOL 1000 MG/10ML IJ SOLN
500.0000 mg | Freq: Four times a day (QID) | INTRAVENOUS | Status: DC | PRN
  Filled 2015-10-31: qty 5

## 2015-10-31 MED ORDER — TIZANIDINE HCL 2 MG PO TABS
2.0000 mg | ORAL_TABLET | Freq: Four times a day (QID) | ORAL | Status: DC | PRN
Start: 2015-10-31 — End: 2016-08-20

## 2015-10-31 MED ORDER — 0.9 % SODIUM CHLORIDE (POUR BTL) OPTIME
TOPICAL | Status: DC | PRN
  Administered 2015-10-31: 1000 mL

## 2015-10-31 MED ORDER — ACETAMINOPHEN 650 MG RE SUPP: 650.0000 mg | Freq: Four times a day (QID) | RECTAL | Status: DC | PRN

## 2015-10-31 MED ORDER — BUPIVACAINE HCL 0.5 % IJ SOLN
INTRAMUSCULAR | Status: DC | PRN
  Administered 2015-10-31: 20 mL

## 2015-10-31 MED ORDER — HYDROMORPHONE HCL 1 MG/ML IJ SOLN
0.2500 mg | INTRAMUSCULAR | Status: DC | PRN
  Administered 2015-10-31 (×2): 0.5 mg via INTRAVENOUS

## 2015-10-31 MED ORDER — HYDROMORPHONE HCL 1 MG/ML IJ SOLN
1.0000 mg | INTRAMUSCULAR | Status: DC | PRN
  Administered 2015-10-31 – 2015-11-02 (×5): 1 mg via INTRAVENOUS
  Filled 2015-10-31 (×5): qty 1

## 2015-10-31 MED ORDER — TRANEXAMIC ACID 1000 MG/10ML IV SOLN
2000.0000 mg | INTRAVENOUS | Status: DC | PRN
  Administered 2015-10-31: 2000 mg via TOPICAL

## 2015-10-31 MED ORDER — OXYCODONE-ACETAMINOPHEN 5-325 MG PO TABS: 1.0000 | ORAL_TABLET | ORAL | Status: DC | PRN

## 2015-10-31 MED ORDER — METOCLOPRAMIDE HCL 5 MG/ML IJ SOLN: 5.0000 mg | Freq: Three times a day (TID) | INTRAMUSCULAR | Status: DC | PRN

## 2015-10-31 MED ORDER — BUPIVACAINE LIPOSOME 1.3 % IJ SUSP
20.0000 mL | Freq: Once | INTRAMUSCULAR | Status: DC
  Filled 2015-10-31: qty 20

## 2015-10-31 MED ORDER — ONDANSETRON HCL 4 MG PO TABS
4.0000 mg | ORAL_TABLET | Freq: Four times a day (QID) | ORAL | Status: DC | PRN
  Administered 2015-11-01 (×2): 4 mg via ORAL
  Filled 2015-10-31 (×2): qty 1

## 2015-10-31 MED ORDER — CEFUROXIME SODIUM 1.5 G IJ SOLR
INTRAMUSCULAR | Status: AC
  Filled 2015-10-31: qty 1.5

## 2015-10-31 MED ORDER — PHENOL 1.4 % MT LIQD: 1.0000 | OROMUCOSAL | Status: DC | PRN

## 2015-10-31 MED ORDER — FLEET ENEMA 7-19 GM/118ML RE ENEM: 1.0000 | ENEMA | Freq: Once | RECTAL | Status: DC | PRN

## 2015-10-31 MED ORDER — BUPIVACAINE LIPOSOME 1.3 % IJ SUSP
20.0000 mL | INTRAMUSCULAR | Status: DC
  Filled 2015-10-31: qty 20

## 2015-10-31 MED ORDER — APIXABAN 2.5 MG PO TABS: 2.5000 mg | ORAL_TABLET | Freq: Two times a day (BID) | ORAL | Status: DC

## 2015-10-31 MED ORDER — HYDROMORPHONE HCL 1 MG/ML IJ SOLN
INTRAMUSCULAR | Status: AC
  Filled 2015-10-31: qty 1

## 2015-10-31 MED ORDER — TRANEXAMIC ACID 1000 MG/10ML IV SOLN
2000.0000 mg | Freq: Once | INTRAVENOUS | Status: DC
  Filled 2015-10-31: qty 20

## 2015-10-31 MED ORDER — FENTANYL CITRATE (PF) 250 MCG/5ML IJ SOLN
INTRAMUSCULAR | Status: AC
  Filled 2015-10-31: qty 5

## 2015-10-31 MED ORDER — BUPIVACAINE HCL (PF) 0.5 % IJ SOLN
INTRAMUSCULAR | Status: AC
  Filled 2015-10-31: qty 30

## 2015-10-31 MED ORDER — ONDANSETRON HCL 4 MG/2ML IJ SOLN: 4.0000 mg | Freq: Once | INTRAMUSCULAR | Status: DC | PRN

## 2015-10-31 MED ORDER — CEFUROXIME SODIUM 1.5 G IJ SOLR
INTRAMUSCULAR | Status: DC | PRN
  Administered 2015-10-31: 1.5 g

## 2015-10-31 MED ORDER — GLYCOPYRROLATE 0.2 MG/ML IJ SOLN
INTRAMUSCULAR | Status: DC | PRN
  Administered 2015-10-31: .4 mg via INTRAVENOUS
  Administered 2015-10-31: .2 mg via INTRAVENOUS

## 2015-10-31 MED ORDER — DEXTROSE 5 % IV SOLN
10.0000 mg | INTRAVENOUS | Status: DC | PRN
  Administered 2015-10-31: 25 ug/min via INTRAVENOUS

## 2015-10-31 MED ORDER — FENTANYL CITRATE (PF) 100 MCG/2ML IJ SOLN
INTRAMUSCULAR | Status: DC | PRN
  Administered 2015-10-31 (×2): 25 ug via INTRAVENOUS

## 2015-10-31 MED ORDER — ALUM & MAG HYDROXIDE-SIMETH 200-200-20 MG/5ML PO SUSP: 30.0000 mL | ORAL | Status: DC | PRN

## 2015-10-31 MED ORDER — METHOCARBAMOL 500 MG PO TABS
500.0000 mg | ORAL_TABLET | Freq: Four times a day (QID) | ORAL | Status: DC | PRN
  Administered 2015-10-31 – 2015-11-03 (×4): 500 mg via ORAL
  Filled 2015-10-31 (×4): qty 1

## 2015-10-31 MED ORDER — AMLODIPINE BESYLATE 5 MG PO TABS
5.0000 mg | ORAL_TABLET | Freq: Every day | ORAL | Status: DC
  Administered 2015-10-31 – 2015-11-02 (×3): 5 mg via ORAL
  Filled 2015-10-31 (×4): qty 1

## 2015-10-31 MED ORDER — SODIUM CHLORIDE 0.9 % IJ SOLN
INTRAMUSCULAR | Status: DC | PRN
  Administered 2015-10-31: 20 mL

## 2015-10-31 SURGICAL SUPPLY — 59 items
BANDAGE ESMARK 6X9 LF (GAUZE/BANDAGES/DRESSINGS) ×1 IMPLANT
BLADE SAG 18X100X1.27 (BLADE) ×3 IMPLANT
BLADE SAW SGTL 13X75X1.27 (BLADE) ×3 IMPLANT
BLADE SURG ROTATE 9660 (MISCELLANEOUS) IMPLANT
BNDG COHESIVE 4X5 TAN STRL (GAUZE/BANDAGES/DRESSINGS) ×3 IMPLANT
BNDG ELASTIC 6X10 VLCR STRL LF (GAUZE/BANDAGES/DRESSINGS) ×3 IMPLANT
BNDG ESMARK 6X9 LF (GAUZE/BANDAGES/DRESSINGS) ×3
BOWL SMART MIX CTS (DISPOSABLE) ×3 IMPLANT
CAPT KNEE TOTAL 3 ATTUNE ×3 IMPLANT
CEMENT HV SMART SET (Cement) ×6 IMPLANT
COVER SURGICAL LIGHT HANDLE (MISCELLANEOUS) ×3 IMPLANT
CUFF TOURNIQUET SINGLE 34IN LL (TOURNIQUET CUFF) ×3 IMPLANT
CUFF TOURNIQUET SINGLE 44IN (TOURNIQUET CUFF) IMPLANT
DRAPE EXTREMITY T 121X128X90 (DRAPE) ×3 IMPLANT
DRAPE U-SHAPE 47X51 STRL (DRAPES) ×3 IMPLANT
DRESSING AQUACEL AG SP 3.5X10 (GAUZE/BANDAGES/DRESSINGS) ×1 IMPLANT
DRSG AQUACEL AG ADV 3.5X10 (GAUZE/BANDAGES/DRESSINGS) ×3 IMPLANT
DRSG AQUACEL AG SP 3.5X10 (GAUZE/BANDAGES/DRESSINGS) ×3
DURAPREP 26ML APPLICATOR (WOUND CARE) ×6 IMPLANT
ELECT REM PT RETURN 9FT ADLT (ELECTROSURGICAL) ×3
ELECTRODE REM PT RTRN 9FT ADLT (ELECTROSURGICAL) ×1 IMPLANT
EVACUATOR 1/8 PVC DRAIN (DRAIN) IMPLANT
GLOVE BIO SURGEON STRL SZ7.5 (GLOVE) ×3 IMPLANT
GLOVE BIO SURGEON STRL SZ8.5 (GLOVE) ×3 IMPLANT
GLOVE BIOGEL PI IND STRL 6.5 (GLOVE) ×1 IMPLANT
GLOVE BIOGEL PI IND STRL 8 (GLOVE) ×1 IMPLANT
GLOVE BIOGEL PI IND STRL 9 (GLOVE) ×1 IMPLANT
GLOVE BIOGEL PI INDICATOR 6.5 (GLOVE) ×2
GLOVE BIOGEL PI INDICATOR 8 (GLOVE) ×2
GLOVE BIOGEL PI INDICATOR 9 (GLOVE) ×2
GLOVE SURG SS PI 6.5 STRL IVOR (GLOVE) ×3 IMPLANT
GOWN STRL REUS W/ TWL LRG LVL3 (GOWN DISPOSABLE) ×1 IMPLANT
GOWN STRL REUS W/ TWL XL LVL3 (GOWN DISPOSABLE) ×2 IMPLANT
GOWN STRL REUS W/TWL LRG LVL3 (GOWN DISPOSABLE) ×2
GOWN STRL REUS W/TWL XL LVL3 (GOWN DISPOSABLE) ×4
HANDPIECE INTERPULSE COAX TIP (DISPOSABLE) ×2
HOOD PEEL AWAY FACE SHEILD DIS (HOOD) ×6 IMPLANT
KIT BASIN OR (CUSTOM PROCEDURE TRAY) ×3 IMPLANT
KIT ROOM TURNOVER OR (KITS) ×3 IMPLANT
MANIFOLD NEPTUNE II (INSTRUMENTS) ×3 IMPLANT
NEEDLE SPNL 18GX3.5 QUINCKE PK (NEEDLE) IMPLANT
NS IRRIG 1000ML POUR BTL (IV SOLUTION) ×3 IMPLANT
PACK TOTAL JOINT (CUSTOM PROCEDURE TRAY) ×3 IMPLANT
PAD ARMBOARD 7.5X6 YLW CONV (MISCELLANEOUS) ×6 IMPLANT
SET HNDPC FAN SPRY TIP SCT (DISPOSABLE) ×1 IMPLANT
SPONGE LAP 18X18 X RAY DECT (DISPOSABLE) ×6 IMPLANT
SUT VIC AB 0 CT1 27 (SUTURE) ×2
SUT VIC AB 0 CT1 27XBRD ANBCTR (SUTURE) ×1 IMPLANT
SUT VIC AB 1 CTX 36 (SUTURE) ×2
SUT VIC AB 1 CTX36XBRD ANBCTR (SUTURE) ×1 IMPLANT
SUT VIC AB 2-0 CT1 27 (SUTURE)
SUT VIC AB 2-0 CT1 TAPERPNT 27 (SUTURE) IMPLANT
SUT VIC AB 3-0 CT1 27 (SUTURE) ×2
SUT VIC AB 3-0 CT1 TAPERPNT 27 (SUTURE) ×1 IMPLANT
SYR 50ML LL SCALE MARK (SYRINGE) ×3 IMPLANT
TOWEL OR 17X24 6PK STRL BLUE (TOWEL DISPOSABLE) ×3 IMPLANT
TOWEL OR 17X26 10 PK STRL BLUE (TOWEL DISPOSABLE) ×3 IMPLANT
TRAY CATH 16FR W/PLASTIC CATH (SET/KITS/TRAYS/PACK) IMPLANT
WATER STERILE IRR 1000ML POUR (IV SOLUTION) ×9 IMPLANT

## 2015-10-31 NOTE — Op Note (Signed)
PATIENT ID:      Robert Gardner  MRN:     [id number] DOB/AGE:    1940-09-15 / 75 y.o.       OPERATIVE REPORT    DATE OF PROCEDURE:  10/31/2015       PREOPERATIVE DIAGNOSIS:   RIGHT KNEE OSTEOARTHRITIS      Estimated body mass index is 22.6 kg/(m^2) as calculated from the following:   Height as of this encounter: 5\' 11"  (1.803 m).   Weight as of this encounter: 73.483 kg (162 lb).                                                        POSTOPERATIVE DIAGNOSIS:   RIGHT KNEE OSTEOARTHRITIS                                                                      PROCEDURE:  Procedure(s): RIGHT TOTAL KNEE ARTHROPLASTY Using DepuyAttune RP implants #6R Femur, #8Tibia, 10 mm Attune RP bearing, 41 Patella     SURGEON: Denny Mccree J    ASSISTANT:   Eric K. [employer]   (Present and scrubbed throughout the case, critical for assistance with exposure, retraction, instrumentation, and closure.)         ANESTHESIA: Spinal, 20cc Exparel, 20cc 0.5% Marcaine  EBL: 300  FLUID REPLACEMENT: 1600 crystalloid  TOURNIQUET TIME: 43min  Drains: None  Tranexamic Acid: 2gm topical   COMPLICATIONS:  None         INDICATIONS FOR PROCEDURE: The patient has  RIGHT KNEE OSTEOARTHRITIS, Var deformities, XR shows bone on bone arthritis, lateral subluxation of tibia. Patient has failed all conservative measures including anti-inflammatory medicines, narcotics, attempts at  exercise and weight loss, cortisone injections and viscosupplementation.  Risks and benefits of surgery have been discussed, questions answered.   DESCRIPTION OF PROCEDURE: The patient identified by armband, received  IV antibiotics, in the holding area at [facility]. Patient taken to the operating room, appropriate anesthetic  monitors were attached, and Spinal anesthesia was  induced. Tourniquet  applied high to the operative thigh. Lateral post and foot positioner  applied to the table, the lower extremity was then prepped and draped  in  usual sterile fashion from the toes to the tourniquet. Time-out procedure was performed. We began the operation, with the knee flexed 120 degrees, by making the anterior midline incision starting at handbreadth above the patella going over the patella 1 cm medial to and 4 cm distal to the tibial tubercle. Small bleeders in the skin and the  subcutaneous tissue identified and cauterized. Transverse retinaculum was incised and reflected medially and a medial parapatellar arthrotomy was accomplished. the patella was everted and theprepatellar fat pad resected. The superficial medial collateral  ligament was then elevated from anterior to posterior along the proximal  flare of the tibia and anterior half of the menisci resected. The knee was hyperflexed exposing bone on bone arthritis. Peripheral and notch osteophytes as well as the cruciate ligaments were then resected. We continued to  work our way around posteriorly along the proximal tibia, and  externally  rotated the tibia subluxing it out from underneath the femur. A McHale  retractor was placed through the notch and a lateral Hohmann retractor  placed, and we then drilled through the proximal tibia in line with the  axis of the tibia followed by an intramedullary guide rod and 2-degree  posterior slope cutting guide. The tibial cutting guide, 3 degree posterior sloped, was pinned into place allowing resection of 0 mm of bone medially and 11 mm of bone laterally. Satisfied with the tibial resection, we then  entered the distal femur 2 mm anterior to the PCL origin with the  intramedullary guide rod and applied the distal femoral cutting guide  set at 9 mm, with 5 degrees of valgus. This was pinned along the  epicondylar axis. At this point, the distal femoral cut was accomplished without difficulty. We then sized for a #6R femoral component and pinned the guide in 3 degrees of external rotation. The chamfer cutting guide was pinned into place. The  anterior, posterior, and chamfer cuts were accomplished without difficulty followed by  the Attune RP box cutting guide and the box cut. We also removed posterior osteophytes from the posterior femoral condyles. At this  time, the knee was brought into full extension. We checked our  extension and flexion gaps and found them symmetric for a 10 mm bearing. Distracting in extension with a lamina spreader, the posterior horns of the menisci were removed, and Exparel, diluted to 60 cc, with 20cc NS, and 20cc 0.5% Marcaine,was injected into the capsule and synovium of the knee. The posterior patella cut was accomplished with the 9.5 mm Attune cutting guide, sized for a 56mm dome, and the fixation pegs drilled.The knee  was then once again hyperflexed exposing the proximal tibia. We sized for a # 8 tibial base plate, applied the smokestack and the conical reamer followed by the the Delta fin keel punch. We then hammered into place the Attune RP trial femoral component, drilled the lugs, inserted a  10 mm trial bearing, trial patellar button, and took the knee through range of motion from 0-130 degrees. No thumb pressure was required for patellar Tracking. At this point, the limb was wrapped with an Esmarch bandage and the tourniquet inflated to 350 mmHg. All trial components were removed, mating surfaces irrigated with pulse lavage, and dried with suction and sponges. A double batch of DePuy HV cement with 1500 mg of Zinacef was mixed and applied to all bony metallic mating surfaces except for the posterior condyles of the femur itself. In order, we  hammered into place the tibial tray and removed excess cement, the femoral component and removed excess cement. The final Attune RP bearing  was inserted, and the knee brought to full extension with compression.  The patellar button was clamped into place, and excess cement  removed. While the cement cured the wound was irrigated out with normal saline solution pulse  lavage. Ligament stability and patellar tracking were checked and found to be excellent. The parapatellar arthrotomy was closed with  running #1 Vicryl suture. The subcutaneous tissue with 0 and 2-0 undyed  Vicryl suture, and the skin with running 3-0 SQ vicryl. A dressing of Xeroform,  4 x 4, dressing sponges, Webril, and Ace wrap applied. The patient  awakened, and taken to recovery room without difficulty.   Dakiyah Heinke J 10/31/2015, 11:25 AM

## 2015-10-31 NOTE — Transfer of Care (Signed)
Immediate Anesthesia Transfer of Care Note  Patient: Robert Gardner  Procedure(s) Performed: Procedure(s): RIGHT TOTAL KNEE ARTHROPLASTY (Right)  Patient Location: PACU  Anesthesia Type:MAC and Spinal  Level of Consciousness: awake, alert  and patient cooperative  Airway & Oxygen Therapy: Patient Spontanous Breathing  Post-op Assessment: Report given to RN and Post -op Vital signs reviewed and stable  Post vital signs: Reviewed and stable  Last Vitals:  Filed Vitals:   10/31/15 0840 10/31/15 1145  BP: 170/80   Pulse: 82 47  Temp: 36.7 C   Resp: 18 16    Last Pain: There were no vitals filed for this visit.       Complications: No apparent anesthesia complications

## 2015-10-31 NOTE — Interval H&P Note (Signed)
History and Physical Interval Note:  10/31/2015 9:14 AM  Robert Gardner  has presented today for surgery, with the diagnosis of RIGHT KNEE OSTEOARTHRITIS  The various methods of treatment have been discussed with the patient and family. After consideration of risks, benefits and other options for treatment, the patient has consented to  Procedure(s): TOTAL KNEE ARTHROPLASTY (Right) as a surgical intervention .  The patient's history has been reviewed, patient examined, no change in status, stable for surgery.  I have reviewed the patient's chart and labs.  Questions were answered to the patient's satisfaction.     Kerin Salen

## 2015-10-31 NOTE — Discharge Instructions (Addendum)
INSTRUCTIONS AFTER JOINT REPLACEMENT  ° °o Remove items at home which could result in a fall. This includes throw rugs or furniture in walking pathways °o ICE to the affected joint every three hours while awake for 30 minutes at a time, for at least the first 3-5 days, and then as needed for pain and swelling.  Continue to use ice for pain and swelling. You may notice swelling that will progress down to the foot and ankle.  This is normal after surgery.  Elevate your leg when you are not up walking on it.   °o Continue to use the breathing machine you got in the hospital (incentive spirometer) which will help keep your temperature down.  It is common for your temperature to cycle up and down following surgery, especially at night when you are not up moving around and exerting yourself.  The breathing machine keeps your lungs expanded and your temperature down. ° ° °DIET:  As you were doing prior to hospitalization, we recommend a well-balanced diet. ° °DRESSING / WOUND CARE / SHOWERING ° °Keep the surgical dressing until follow up.  The dressing is water proof, so you can shower without any extra covering.  IF THE DRESSING FALLS OFF or the wound gets wet inside, change the dressing with sterile gauze.  Please use good hand washing techniques before changing the dressing.  Do not use any lotions or creams on the incision until instructed by your surgeon.   ° °ACTIVITY ° °o Increase activity slowly as tolerated, but follow the weight bearing instructions below.   °o No driving for 6 weeks or until further direction given by your physician.  You cannot drive while taking narcotics.  °o No lifting or carrying greater than 10 lbs. until further directed by your surgeon. °o Avoid periods of inactivity such as sitting longer than an hour when not asleep. This helps prevent blood clots.  °o You may return to work once you are authorized by your doctor.  ° ° ° °WEIGHT BEARING  ° °Weight bearing as tolerated with assist  device (walker, cane, etc) as directed, use it as long as suggested by your surgeon or therapist, typically at least 4-6 weeks. ° ° °EXERCISES ° °Results after joint replacement surgery are often greatly improved when you follow the exercise, range of motion and muscle strengthening exercises prescribed by your doctor. Safety measures are also important to protect the joint from further injury. Any time any of these exercises cause you to have increased pain or swelling, decrease what you are doing until you are comfortable again and then slowly increase them. If you have problems or questions, call your caregiver or physical therapist for advice.  ° °Rehabilitation is important following a joint replacement. After just a few days of immobilization, the muscles of the leg can become weakened and shrink (atrophy).  These exercises are designed to build up the tone and strength of the thigh and leg muscles and to improve motion. Often times heat used for twenty to thirty minutes before working out will loosen up your tissues and help with improving the range of motion but do not use heat for the first two weeks following surgery (sometimes heat can increase post-operative swelling).  ° °These exercises can be done on a training (exercise) mat, on the floor, on a table or on a bed. Use whatever works the best and is most comfortable for you.    Use music or television while you are exercising so that   the exercises are a pleasant break in your day. This will make your life better with the exercises acting as a break in your routine that you can look forward to.   Perform all exercises about fifteen times, three times per day or as directed.  You should exercise both the operative leg and the other leg as well. ° °Exercises include: °  °• Quad Sets - Tighten up the muscle on the front of the thigh (Quad) and hold for 5-10 seconds.   °• Straight Leg Raises - With your knee straight (if you were given a brace, keep it on),  lift the leg to 60 degrees, hold for 3 seconds, and slowly lower the leg.  Perform this exercise against resistance later as your leg gets stronger.  °• Leg Slides: Lying on your back, slowly slide your foot toward your buttocks, bending your knee up off the floor (only go as far as is comfortable). Then slowly slide your foot back down until your leg is flat on the floor again.  °• Angel Wings: Lying on your back spread your legs to the side as far apart as you can without causing discomfort.  °• Hamstring Strength:  Lying on your back, push your heel against the floor with your leg straight by tightening up the muscles of your buttocks.  Repeat, but this time bend your knee to a comfortable angle, and push your heel against the floor.  You may put a pillow under the heel to make it more comfortable if necessary.  ° °A rehabilitation program following joint replacement surgery can speed recovery and prevent re-injury in the future due to weakened muscles. Contact your doctor or a physical therapist for more information on knee rehabilitation.  ° ° °CONSTIPATION ° °Constipation is defined medically as fewer than three stools per week and severe constipation as less than one stool per week.  Even if you have a regular bowel pattern at home, your normal regimen is likely to be disrupted due to multiple reasons following surgery.  Combination of anesthesia, postoperative narcotics, change in appetite and fluid intake all can affect your bowels.  ° °YOU MUST use at least one of the following options; they are listed in order of increasing strength to get the job done.  They are all available over the counter, and you may need to use some, POSSIBLY even all of these options:   ° °Drink plenty of fluids (prune juice may be helpful) and high fiber foods °Colace 100 mg by mouth twice a day  °Senokot for constipation as directed and as needed Dulcolax (bisacodyl), take with full glass of water  °Miralax (polyethylene glycol)  once or twice a day as needed. ° °If you have tried all these things and are unable to have a bowel movement in the first 3-4 days after surgery call either your surgeon or your primary doctor.   ° °If you experience loose stools or diarrhea, hold the medications until you stool forms back up.  If your symptoms do not get better within 1 week or if they get worse, check with your doctor.  If you experience "the worst abdominal pain ever" or develop nausea or vomiting, please contact the office immediately for further recommendations for treatment. ° ° °ITCHING:  If you experience itching with your medications, try taking only a single pain pill, or even half a pain pill at a time.  You can also use Benadryl over the counter for itching or also to   help with sleep.   TED HOSE STOCKINGS:  Use stockings on both legs until for at least 2 weeks or as directed by physician office. They may be removed at night for sleeping.  MEDICATIONS:  See your medication summary on the After Visit Summary that nursing will review with you.  You may have some home medications which will be placed on hold until you complete the course of blood thinner medication.  It is important for you to complete the blood thinner medication as prescribed.  PRECAUTIONS:  If you experience chest pain or shortness of breath - call 911 immediately for transfer to the hospital emergency department.   If you develop a fever greater that 101 F, purulent drainage from wound, increased redness or drainage from wound, foul odor from the wound/dressing, or calf pain - CONTACT YOUR SURGEON.                                                   FOLLOW-UP APPOINTMENTS:  If you do not already have a post-op appointment, please call the office for an appointment to be seen by your surgeon.  Guidelines for how soon to be seen are listed in your After Visit Summary, but are typically between 1-4 weeks after surgery.  OTHER INSTRUCTIONS:   Knee  Replacement:  Do not place pillow under knee, focus on keeping the knee straight while resting. CPM instructions: 0-90 degrees, 2 hours in the morning, 2 hours in the afternoon, and 2 hours in the evening. Place foam block, curve side up under heel at all times except when in CPM or when walking.  DO NOT modify, tear, cut, or change the foam block in any way.  MAKE SURE YOU:   Understand these instructions.   Get help right away if you are not doing well or get worse.    Thank you for letting us be a part of your medical care team.  It is a privilege we respect greatly.  We hope these instructions will help you stay on track for a fast and full recovery!   Information on my medicine - ELIQUIS (apixaban)  This medication education was reviewed with me or my healthcare representative as part of my discharge preparation.  The pharmacist that spoke with me during my hospital stay was:  Anette Guarneri, PharmD  Why was Eliquis prescribed for you? Eliquis was prescribed for you to reduce the risk of blood clots forming after orthopedic surgery.    What do You need to know about Eliquis? Take your Eliquis TWICE DAILY - one tablet in the morning and one tablet in the evening with or without food.  It would be best to take the dose about the same time each day.  If you have difficulty swallowing the tablet whole please discuss with your pharmacist how to take the medication safely.  Take Eliquis exactly as prescribed by your doctor and DO NOT stop taking Eliquis without talking to the doctor who prescribed the medication.  Stopping without other medication to take the place of Eliquis may increase your risk of developing a clot.  After discharge, you should have regular check-up appointments with your healthcare provider that is prescribing your Eliquis.  What do you do if you miss a dose? If a dose of ELIQUIS is not taken at the scheduled time, take it  as soon as possible on the same day  and twice-daily administration should be resumed.  The dose should not be doubled to make up for a missed dose.  Do not take more than one tablet of ELIQUIS at the same time.  Important Safety Information A possible side effect of Eliquis is bleeding. You should call your healthcare provider right away if you experience any of the following: ? Bleeding from an injury or your nose that does not stop. ? Unusual colored urine (red or dark brown) or unusual colored stools (red or black). ? Unusual bruising for unknown reasons. ? A serious fall or if you hit your head (even if there is no bleeding).  Some medicines may interact with Eliquis and might increase your risk of bleeding or clotting while on Eliquis. To help avoid this, consult your healthcare provider or pharmacist prior to using any new prescription or non-prescription medications, including herbals, vitamins, non-steroidal anti-inflammatory drugs (NSAIDs) and supplements.  This website has more information on Eliquis (apixaban): http://www.eliquis.com/eliquis/home

## 2015-10-31 NOTE — Anesthesia Preprocedure Evaluation (Addendum)
Anesthesia Evaluation  Patient identified by MRN, date of birth, ID band Patient awake    Reviewed: Allergy & Precautions, NPO status , Patient's Chart, lab work & pertinent test results  Airway Mallampati: II  TM Distance: >3 FB Neck ROM: Full    Dental no notable dental hx. (+) Dental Advisory Given, Teeth Intact   Pulmonary shortness of breath and with exertion, former smoker,    Pulmonary exam normal breath sounds clear to auscultation       Cardiovascular hypertension, Pt. on medications +CHF  Normal cardiovascular exam+ dysrhythmias Atrial Fibrillation  Rhythm:Regular Rate:Normal  Dilated cardiomyopathy LVEF 40-45% Pulmonary HTN PASP 63mmHg 2nd Degree AV Block Mobitz Type 1 10% lesion mid RCA Inferolateral and Inferoseptal hypokinesis   Neuro/Psych Seizures -, Well Controlled,   Neuromuscular disease (Bell Palsy) negative psych ROS   GI/Hepatic negative GI ROS, Neg liver ROS,   Endo/Other  negative endocrine ROS  Renal/GU Renal InsufficiencyRenal disease (CKD stage II)  negative genitourinary   Musculoskeletal  (+) Arthritis , Osteoarthritis,  OA right knee   Abdominal   Peds  Hematology  (+) anemia ,   Anesthesia Other Findings   Reproductive/Obstetrics                          Anesthesia Physical Anesthesia Plan  ASA: III  Anesthesia Plan: Spinal   Post-op Pain Management:    Induction:   Airway Management Planned: Natural Airway and Nasal Cannula  Additional Equipment:   Intra-op Plan:   Post-operative Plan:   Informed Consent: I have reviewed the patients History and Physical, chart, labs and discussed the procedure including the risks, benefits and alternatives for the proposed anesthesia with the patient or authorized representative who has indicated his/her understanding and acceptance.   Dental advisory given  Plan Discussed with: CRNA, Anesthesiologist and  Surgeon  Anesthesia Plan Comments:         Anesthesia Quick Evaluation

## 2015-10-31 NOTE — Progress Notes (Signed)
Arrival Method: via bed Mental Status: responds to voice, oriented x 4 Telemetry: n/a Tubes: n/a IV: LFA Pain: denies Family: wife, daughters at bedside Living Situation: home with wife  Safety Measures: bed in lowest position, call bell in reach, non skid socks on 5N Orientation: oriented to staff and unit

## 2015-10-31 NOTE — Evaluation (Signed)
Physical Therapy Evaluation Patient Details Name: Robert Gardner MRN: [id number] DOB: [birth year] Today's Date: 10/31/2015   History of Present Illness  Patient is a 75 y/o male admitted for R TKA PMH positive for HTN, a-fib, Second degree AV block, Mobitz type I, CHF, Bell's palsy, seizure.  Clinical Impression  Patient presents with decreased independence with mobility due to deficits listed in PT problem list.  He will benefit from skilled PT in the acute setting to allow return home with family support and HHPT.     Follow Up Recommendations Home health PT;Supervision for mobility/OOB    Equipment Recommendations  Rolling walker with 5" wheels;3in1 (PT)    Recommendations for Other Services       Precautions / Restrictions Precautions Precautions: Fall;Knee Restrictions Weight Bearing Restrictions: Yes RLE Weight Bearing: Weight bearing as tolerated      Mobility  Bed Mobility Overal bed mobility: Needs Assistance Bed Mobility: Supine to Sit     Supine to sit: Min assist     General bed mobility comments: assist for R LE and for balance  Transfers Overall transfer level: Needs assistance Equipment used: Rolling walker (2 wheeled) Transfers: Sit to/from [facility] Sit to Stand: Min assist Stand pivot transfers: Min assist       General transfer comment: up from EOB, cues for hand placement, technique and assist for balance, lifting; pivot to chair with RW and min A for balance, cues for sequencing  Ambulation/Gait                Stairs            Wheelchair Mobility    Modified Rankin (Stroke Patients Only)       Balance Overall balance assessment: Needs assistance Sitting-balance support: Bilateral upper extremity supported Sitting balance-Leahy Scale: Poor Sitting balance - Comments: reliant on UE support due to lightheaded   Standing balance support: Bilateral upper extremity supported Standing balance-Leahy Scale:  Poor Standing balance comment: assist and UE support for balance                             Pertinent Vitals/Pain Pain Assessment: 0-10 Pain Score: 8  Pain Location: R knee Pain Descriptors / Indicators: Aching Pain Intervention(s): Monitored during session;Repositioned;Limited activity within patient's tolerance;Premedicated before session    Home Living Family/patient expects to be discharged to:: Private residence Living Arrangements: Spouse/significant other Available Help at Discharge: Family;Available 24 hours/day Type of Home: House Home Access: Stairs to enter Entrance Stairs-Rails: Right Entrance Stairs-Number of Steps: 5 Home Layout: One level Home Equipment: Cane - single point      Prior Function Level of Independence: Independent with assistive device(s)         Comments: used cane PTA, was active, drove a bus     Hand Dominance        Extremity/Trunk Assessment   Upper Extremity Assessment: Overall WFL for tasks assessed           Lower Extremity Assessment: RLE deficits/detail RLE Deficits / Details: AROM ankle WFL, knee extension -20, flexion at least 50 limited by pain       Communication   Communication: No difficulties  Cognition Arousal/Alertness: Awake/alert Behavior During Therapy: WFL for tasks assessed/performed Overall Cognitive Status: Within Functional Limits for tasks assessed                      General Comments General comments (skin integrity, edema,  etc.): wife in room     Exercises Total Joint Exercises Ankle Circles/Pumps: AROM;Both;10 reps;Supine Quad Sets: AROM;5 reps;Right;Supine Heel Slides: AROM;Left;5 reps;Supine      Assessment/Plan    PT Assessment Patient needs continued PT services  PT Diagnosis Difficulty walking;Acute pain;Generalized weakness   PT Problem List Decreased strength;Decreased activity tolerance;Decreased balance;Decreased mobility;Pain;Decreased knowledge of use  of DME;Decreased range of motion  PT Treatment Interventions DME instruction;Gait training;Balance training;Stair training;Functional mobility training;Patient/family education;Therapeutic activities;Therapeutic exercise   PT Goals (Current goals can be found in the Care Plan section) Acute Rehab PT Goals Patient Stated Goal: To go home PT Goal Formulation: With patient/family Time For Goal Achievement: 11/04/15 Potential to Achieve Goals: Good    Frequency 7X/week   Barriers to discharge        Co-evaluation               End of Session Equipment Utilized During Treatment: Gait belt Activity Tolerance: Patient limited by fatigue Patient left: with call bell/phone within reach;in chair;with family/visitor present           Time: [id number] PT Time Calculation (min) (ACUTE ONLY): 29 min   Charges:   PT Evaluation $PT Eval Moderate Complexity: 1 Procedure PT Treatments $Therapeutic Activity: 8-22 mins   PT G CodesReginia Naas 28-Nov-2015, 5:38 PM Magda Kiel, [place] 11-28-15

## 2015-10-31 NOTE — Anesthesia Procedure Notes (Addendum)
Spinal Patient location during procedure: OR Start time: 10/31/2015 9:56 AM Staffing Anesthesiologist: Josephine Igo Performed by: anesthesiologist  Preanesthetic Checklist Completed: patient identified, site marked, surgical consent, pre-op evaluation, timeout performed, IV checked, risks and benefits discussed and monitors and equipment checked Spinal Block Patient position: sitting Prep: site prepped and draped and DuraPrep Patient monitoring: heart rate, cardiac monitor, continuous pulse ox and blood pressure Approach: midline Location: L3-4 Injection technique: single-shot Needle Needle type: Sprotte  Needle gauge: 24 G Needle length: 9 cm Needle insertion depth: 5 cm Assessment Sensory level: T6 Additional Notes Patient tolerated procedure well. Adequate sensory level.  Procedure Name: MAC Date/Time: 10/31/2015 9:53 AM Performed by: Salli Quarry Sumayyah Custodio Pre-anesthesia Checklist: Patient identified, Emergency Drugs available, Suction available, Patient being monitored and Timeout performed Patient Re-evaluated:Patient Re-evaluated prior to inductionOxygen Delivery Method: Nasal cannula

## 2015-10-31 NOTE — Anesthesia Postprocedure Evaluation (Signed)
Anesthesia Post Note  Patient: Robert Gardner  Procedure(s) Performed: Procedure(s) (LRB): RIGHT TOTAL KNEE ARTHROPLASTY (Right)  Patient location during evaluation: PACU Anesthesia Type: Spinal Level of consciousness: awake and alert and oriented Pain management: pain level controlled Vital Signs Assessment: post-procedure vital signs reviewed and stable Respiratory status: spontaneous breathing, nonlabored ventilation and respiratory function stable Cardiovascular status: blood pressure returned to baseline and stable Postop Assessment: no signs of nausea or vomiting, no headache, no backache, spinal receding and patient able to bend at knees Anesthetic complications: no    Last Vitals:  Filed Vitals:   10/31/15 1245 10/31/15 1246  BP:  143/63  Pulse:  49  Temp:    Resp: 19 18    Last Pain:  Filed Vitals:   10/31/15 1249  PainSc: 0-No pain                 Irbin Fines A.

## 2015-10-31 NOTE — Progress Notes (Signed)
Orthopedic Tech Progress Note Patient Details:  Robert Gardner August 30, 1940 [id number]  CPM Right Knee CPM Right Knee: On Right Knee Flexion (Degrees): 40 Right Knee Extension (Degrees): 0 Additional Comments: trapeze bar patient helper Viewed order from doctor's order list  [name] 10/31/2015, 1:11 PM

## 2015-11-01 ENCOUNTER — Encounter (HOSPITAL_COMMUNITY): Payer: Self-pay | Admitting: Orthopedic Surgery

## 2015-11-01 LAB — BASIC METABOLIC PANEL
ANION GAP: 11 (ref 5–15)
BUN: 21 mg/dL — ABNORMAL HIGH (ref 6–20)
CALCIUM: 8.8 mg/dL — AB (ref 8.9–10.3)
CHLORIDE: 102 mmol/L (ref 101–111)
CO2: 22 mmol/L (ref 22–32)
Creatinine, Ser: 1.58 mg/dL — ABNORMAL HIGH (ref 0.61–1.24)
GFR calc non Af Amer: 42 mL/min — ABNORMAL LOW (ref >60)
GFR, EST AFRICAN AMERICAN: 49 mL/min — AB (ref >60)
Glucose, Bld: 132 mg/dL — ABNORMAL HIGH (ref 65–99)
Potassium: 4.2 mmol/L (ref 3.5–5.1)
Sodium: 135 mmol/L (ref 135–145)

## 2015-11-01 LAB — CBC
HEMATOCRIT: 25.9 % — AB (ref 39.0–52.0)
HEMOGLOBIN: 8.7 g/dL — AB (ref 13.0–17.0)
MCH: 32.1 pg (ref 26.0–34.0)
MCHC: 33.6 g/dL (ref 30.0–36.0)
MCV: 95.6 fL (ref 78.0–100.0)
Platelets: 178 10*3/uL (ref 150–400)
RBC: 2.71 MIL/uL — ABNORMAL LOW (ref 4.22–5.81)
RDW: 13.6 % (ref 11.5–15.5)
WBC: 6.4 10*3/uL (ref 4.0–10.5)

## 2015-11-01 NOTE — Progress Notes (Signed)
Occupational Therapy Evaluation Patient Details Name: Robert Gardner MRN: [id number] DOB: Apr 08, 1941 Today's Date: 11/01/2015    History of Present Illness Patient is a 75 y/o male admitted for R TKA PMH positive for HTN, a-fib, Second degree AV block, Mobitz type I, CHF, Bell's palsy, seizure.   Clinical Impression   PTA, pt was independent with ADLs and used [facility] for mobility. Pt currently requires mod assist for LB ADLs and min assist for functional transfers and ambulation. Pt became lightheaded and dizzy after ambulating 15' - pt's BP taken in supine immediately 94/38 HR 49. Pt placed in trendelenberg position and BP taken after 5 minutes in supine 152/87 HR 86. RN notified. Pt plans to d/c home with 24/7 assistance from his wife. Pt will benefit from continued acute OT to increase independence and safety with ADLs and mobility to allow for safe discharge home. Recommend HHOT upon d/c and 3in1 and RW for home use.    Follow Up Recommendations  Home health OT;Supervision/Assistance - 24 hour    Equipment Recommendations  3 in 1 bedside comode;Other (comment) (RW-2 wheeled)    Recommendations for Other Services       Precautions / Restrictions Precautions Precautions: Fall;Knee Precaution Booklet Issued: No Precaution Comments: Reviewed not placing ice pack, pillow, or other object under knee Restrictions Weight Bearing Restrictions: Yes RLE Weight Bearing: Weight bearing as tolerated      Mobility Bed Mobility Overal bed mobility: Needs Assistance Bed Mobility: Sit to Supine       Sit to supine: Min assist   General bed mobility comments: HOB flat, use of bedrails. Assist to progress bilateral LE onto bed due to increased RLE pain and pt reporting sudden lightheadedness and dizziness.  Transfers Overall transfer level: Needs assistance Equipment used: Rolling walker (2 wheeled) Transfers: Sit to/from Stand Sit to Stand: Min assist         General transfer  comment: Min assist for boost to stand and for balance. Pt ambulated 7' and required 1 minute standing rest break. After ambulating 15' pt suddenly felt lightheaded and dizzy - laid pt down and BP was 94/38 HR 49 - placed pt in trendelenberg position and after 5 minutes BP 152/67 HTR 86 with pt reporting improvement in symptoms.    Balance Overall balance assessment: Needs assistance Sitting-balance support: No upper extremity supported;Feet supported Sitting balance-Leahy Scale: Fair     Standing balance support: Bilateral upper extremity supported;During functional activity Standing balance-Leahy Scale: Poor Standing balance comment: Heavy reliance on RW for UE support                             ADL Overall ADL's : Needs assistance/impaired                 Upper Body Dressing : Set up;Sitting   Lower Body Dressing: Moderate assistance;Sit to/from stand   Toilet Transfer: Minimal assistance;Cueing for safety;Ambulation;RW Toilet Transfer Details (indicate cue type and reason): simulated from chair to bed Toileting- Clothing Manipulation and Hygiene: Minimal assistance;Sit to/from stand       Functional mobility during ADLs: Minimal assistance;Rolling walker General ADL Comments: Pt limited by pain and fatigue this session. Began education on precautions and compensatory strategies for ADLs. Pt's wife present for OT eval.     Vision Vision Assessment?: No apparent visual deficits   Perception     Praxis      Pertinent Vitals/Pain Pain Assessment: 0-10 Pain Score: 7  Pain Location: R knee Pain Descriptors / Indicators: Aching;Sore Pain Intervention(s): Limited activity within patient's tolerance;Monitored during session;Repositioned     Hand Dominance Right   Extremity/Trunk Assessment Upper Extremity Assessment Upper Extremity Assessment: Overall WFL for tasks assessed   Lower Extremity Assessment Lower Extremity Assessment: RLE  deficits/detail RLE Deficits / Details: decreased ROM and strength as expected post op RLE: Unable to fully assess due to pain   Cervical / Trunk Assessment Cervical / Trunk Assessment: Normal   Communication Communication Communication: No difficulties   Cognition Arousal/Alertness: Lethargic;Suspect due to medications Behavior During Therapy: [facility] for tasks assessed/performed Overall Cognitive Status: Within Functional Limits for tasks assessed                     General Comments       Exercises       Shoulder Instructions      Home Living Family/patient expects to be discharged to:: Private residence Living Arrangements: Spouse/significant other Available Help at Discharge: Family;Available 24 hours/day Type of Home: House Home Access: Stairs to enter [employer] of Steps: 5 Entrance Stairs-Rails: Right Home Layout: One level     Bathroom Shower/Tub: Tub/shower unit;Walk-in shower Shower/tub characteristics: Architectural technologist: Standard     Home Equipment: Cane - single point   Additional Comments: Pt typically uses tub shower      Prior Functioning/Environment Level of Independence: Independent with assistive device(s)        Comments: used cane PTA, was active, drove a bus    OT Diagnosis: Acute pain   OT Problem List: Decreased strength;Decreased range of motion;Decreased activity tolerance;Impaired balance (sitting and/or standing);Decreased safety awareness;Decreased knowledge of use of DME or AE;Decreased knowledge of precautions;Pain   OT Treatment/Interventions: Self-care/ADL training;Therapeutic exercise;DME and/or AE instruction;Energy conservation;Therapeutic activities;Patient/family education;Balance training    OT Goals(Current goals can be found in the care plan section) Acute Rehab OT Goals Patient Stated Goal: to decrease pain OT Goal Formulation: With patient Time For Goal Achievement: 11/15/15 Potential to  Achieve Goals: Good ADL Goals Pt Will Perform Grooming: with modified independence;standing Pt Will Perform Lower Body Bathing: with supervision;sit to/from stand Pt Will Perform Lower Body Dressing: with modified independence;sit to/from stand Pt Will Transfer to Toilet: with modified independence;ambulating;bedside commode (over toilet) Pt Will Perform Toileting - Clothing Manipulation and hygiene: with modified independence;sitting/lateral leans;sit to/from stand  OT Frequency: Min 3X/week   Barriers to D/C:            Co-evaluation              End of Session Equipment Utilized During Treatment: Gait belt;Rolling walker CPM Right Knee CPM Right Knee: On Right Knee Flexion (Degrees): 40 Right Knee Extension (Degrees): 0 Nurse Communication: Mobility status;Other (comment) (BP during session)  Activity Tolerance: Patient limited by pain;Patient limited by fatigue Patient left: in bed;with call bell/phone within reach;with SCD's reapplied;with family/visitor present   Time: [id number] OT Time Calculation (min): 36 min Charges:  OT General Charges $OT Visit: 1 Procedure OT Evaluation $OT Eval Moderate Complexity: 1 Procedure OT Treatments $Self Care/Home Management : 8-22 mins G-Codes:    Redmond Baseman, OTR/L Pager: [phone] 11/01/2015, 10:57 AM

## 2015-11-01 NOTE — Progress Notes (Signed)
Patient ID: Kadarius Villaneda, male   DOB: 1941-06-06, 75 y.o.   MRN: [id number] PATIENT ID: Deklen Heydon  MRN: [id number]  DOB/AGE:  Dec 10, 1940 / 75 y.o.  1 Day Post-Op Procedure(s) (LRB): RIGHT TOTAL KNEE ARTHROPLASTY (Right)    PROGRESS NOTE Subjective: Patient is alert, oriented, x1 Nausea, no Vomiting, yes passing gas. Taking PO well. Denies SOB, Chest or Calf Pain. Using Incentive Spirometer, PAS in place. Ambulate WBAT, CPM 0-40 Patient reports pain as 3/10 .    Objective: Vital signs in last 24 hours: Filed Vitals:   10/31/15 1733 10/31/15 2100 11/01/15 0135 11/01/15 0500  BP: 147/77 142/67 127/55 128/58  Pulse:  68 86 88  Temp:  98.4 F (36.9 C) 99.4 F (37.4 C) 99.6 F (37.6 C)  TempSrc:  Oral Oral   Resp:  18 21 18   Height:      Weight:      SpO2:  90% 96% 95%      Intake/Output from previous day: I/O last 3 completed shifts: In: 1150 [I.V.:1150] Out: 1250 [Urine:1050; Blood:200]   Intake/Output this shift:     LABORATORY DATA:  Recent Labs  10/31/15 0835 11/01/15 0618  WBC  --  6.4  HGB  --  8.7*  HCT  --  25.9*  PLT  --  178  INR 1.09  --     Examination: Neurologically intact ABD soft Neurovascular intact Sensation intact distally Intact pulses distally Dorsiflexion/Plantar flexion intact Incision: dressing C/D/I No cellulitis present Compartment soft}  Assessment:   1 Day Post-Op Procedure(s) (LRB): RIGHT TOTAL KNEE ARTHROPLASTY (Right) ADDITIONAL DIAGNOSIS: Expected Acute Blood Loss Anemia, Combined systolic and diastolic CHF, history of seizure, history of atrial fibrillation  Plan: PT/OT WBAT, CPM 5/hrs day until ROM 0-90 degrees, then D/C CPM DVT Prophylaxis:  SCDx72hrs, [place] 2.5 mg by mouth twice a day x 2 weeks, then resume normal dosing of 5 mg twice a day DISCHARGE PLAN: Home, if patient passes PT goals, if not consider short-term rehabilitation DISCHARGE NEEDS: HHPT, CPM, Walker and 3-in-1 comode seat     Trevious Rampey  J 11/01/2015, 7:25 AM

## 2015-11-01 NOTE — Progress Notes (Addendum)
Physical Therapy Treatment Patient Details Name: Robert Gardner MRN: [id number] DOB: 04-23-1941 Today's Date: 11/01/2015    History of Present Illness Patient is a 75 y/o male admitted for R TKA PMH positive for HTN, a-fib, Second degree AV block, Mobitz type I, CHF, Bell's palsy, seizure.    PT Comments    Pt performed decreased mobility and activity.  Pt presents with SHOB, shaking, and nausea, with dry heaving.  Vitals obtained and O2 sats read 84% then returned to 95%.  Informed RN of findings and need to evaluate patients lungs.    Follow Up Recommendations  Home health PT;Supervision for mobility/OOB     Equipment Recommendations  Rolling walker with 5" wheels;3in1 (PT)    Recommendations for Other Services       Precautions / Restrictions Precautions Precautions: Fall;Knee Precaution Booklet Issued: Yes (comment) (Pt to anxious to review left packet in room.  ) Precaution Comments: Reviewed not placing ice pack, pillow, or other object under knee Restrictions Weight Bearing Restrictions: Yes RLE Weight Bearing: Weight bearing as tolerated    Mobility  Bed Mobility Overal bed mobility: Needs Assistance Bed Mobility: Sit to Supine     Supine to sit: Min assist (to assist RLE to edge of bed.  ) Sit to supine: Mod assist   General bed mobility comments: Pt required increased assistance to return to bed with cues for hand placement and trunk control.  Pt performed scooting in supine with bed in trendelenberg position.  Pt shivering and SHOB with complaints of nausea.    Transfers Overall transfer level: Needs assistance Equipment used: Rolling walker (2 wheeled) Transfers: Sit to/from Stand Sit to Stand: Min assist Stand pivot transfers: Mod assist       General transfer comment: Pt required cues for hand placement, trunk control and advancement of RLE forward when sitting edge of bed.    Ambulation/Gait Ambulation Distance (Feet): 2 Feet (steps to bed from  chair.  Pt unable to advance gait due to presentation.   Stairs            Wheelchair Mobility    Modified Rankin (Stroke Patients Only)       Balance Overall balance assessment: Needs assistance Sitting-balance support: No upper extremity supported;Feet supported Sitting balance-Leahy Scale: Fair Sitting balance - Comments: reliant on UE support due to lightheaded     Standing balance-Leahy Scale: Poor                      Cognition Arousal/Alertness: Awake/alert Behavior During Therapy: Restless;Anxious (Pt presents with SHOB breath and nausea.  ) Overall Cognitive Status: Within Functional Limits for tasks assessed                      Exercises Total Joint Exercises Ankle Circles/Pumps: AROM;Both;10 reps;Supine Quad Sets: AROM;Right;10 reps;Supine Heel Slides: Right;10 reps;AAROM;Supine Hip ABduction/ADduction: AAROM;Right;10 reps;Supine Straight Leg Raises: AAROM;Right;10 reps;Supine    General Comments        Pertinent Vitals/Pain Pain Assessment: Faces Pain Score: 3  Faces Pain Scale: Hurts whole lot Pain Location: R knee with movement. Pain Descriptors / Indicators: Grimacing;Guarding;Tightness;Sore;Discomfort Pain Intervention(s): Limited activity within patient's tolerance;Monitored during session;Repositioned    Home Living                      Prior Function            PT Goals (current goals can now be found in the care plan section)  Acute Rehab PT Goals Patient Stated Goal: to decrease pain PT Goal Formulation: With patient/family Potential to Achieve Goals: Good Progress towards PT goals: Progressing toward goals    Frequency  7X/week    PT Plan Current plan remains appropriate    Co-evaluation             End of Session Equipment Utilized During Treatment: Gait belt Activity Tolerance: Patient limited by fatigue Patient left: with call bell/phone within reach;with family/visitor present;in  bed;with nursing/sitter in room     Time: [id number] PT Time Calculation (min) (ACUTE ONLY): 29 min   Charges:  $Therapeutic Exercise: 8-22 mins $Therapeutic Activity: 8-22 mins                   G Codes:      Cristela Blue 11/09/15, 4:04 PM  Governor Rooks, PTA pager [phone]

## 2015-11-01 NOTE — Progress Notes (Signed)
Notified by OT that pt's BP dropped and pt felt somewhat dizzy during their session. Rechecked pt's vitals while in bed, they appear to be stable and pt states his symptoms have resolved (though his pain is a 9/10). Pt currently laying in bed resting and in the CPM--Will continue to monitor    11/01/15 1052  Vitals  BP 119/63 mmHg  BP Location Right Arm  Patient Position (if appropriate) Lying  Pulse Rate 87  Pulse Rate Source [employer]  Oxygen Therapy  SpO2 94 %  O2 Device Room Air

## 2015-11-01 NOTE — Progress Notes (Signed)
Spoke with patient and wife about discharge plan. Wife wants to consult with their daughter before choosing [facility] agency. Will check with wife on 11/02/15 and set up [facility]. [employer] delivered rolling walker and 3N1 to patient's room, [employer] will deliver CPM to patient 's home. Will continue to follow.

## 2015-11-01 NOTE — Progress Notes (Signed)
Physical Therapy Treatment Patient Details Name: Robert Gardner MRN: [id number] DOB: 05-Mar-1941 Today's Date: 11/01/2015    History of Present Illness Patient is a 75 y/o male admitted for R TKA PMH positive for HTN, a-fib, Second degree AV block, Mobitz type I, CHF, Bell's palsy, seizure.    PT Comments    Pt performed increased activity remains to c/o of dizziness but symptoms improve with seated rest breaks.  Pt lethargic during session and required constant cueing to stay of task. Will f/u this pm to advance mobility.    Follow Up Recommendations  Home health PT;Supervision for mobility/OOB     Equipment Recommendations  Rolling walker with 5" wheels;3in1 (PT)    Recommendations for Other Services       Precautions / Restrictions Precautions Precautions: Fall;Knee Precaution Booklet Issued: No Precaution Comments: Reviewed not placing ice pack, pillow, or other object under knee Restrictions Weight Bearing Restrictions: Yes RLE Weight Bearing: Weight bearing as tolerated    Mobility  Bed Mobility Overal bed mobility: Needs Assistance Bed Mobility: Sit to Supine     Supine to sit: Min assist (to assist RLE to edge of bed.  ) Sit to supine: Min assist   General bed mobility comments: HOB flat, use of bedrails. Assist to progress LLE under RLE to allow patient to progress B LEs to edge of bed.  pt reports feeling light headed edge of bed.  Symptoms resolved after seated rest break.    Transfers Overall transfer level: Needs assistance Equipment used: Rolling walker (2 wheeled) Transfers: Sit to/from Stand Sit to Stand: Min assist Stand pivot transfers: Min assist       General transfer comment: Pt required cues for hand placement and advancement of RLE forward to ease pain during transition.  Pt required cues for hip ext, placing R heel on floor, and upper trunk control.  Pt denies dizziness standing edge of bed.    Ambulation/Gait Ambulation/Gait assistance: Min  assist Ambulation Distance (Feet): 18 Feet Assistive device: Rolling walker (2 wheeled) Gait Pattern/deviations: Step-to pattern;Trunk flexed;Decreased stride length;Decreased stance time - right;Decreased step length - left     General Gait Details: Pt required constant cues for forward gaze and sequencing of RW and B LEs.     Stairs            Wheelchair Mobility    Modified Rankin (Stroke Patients Only)       Balance Overall balance assessment: Needs assistance Sitting-balance support: No upper extremity supported;Feet supported Sitting balance-Leahy Scale: Fair Sitting balance - Comments: reliant on UE support due to lightheaded   Standing balance support: Bilateral upper extremity supported;During functional activity Standing balance-Leahy Scale: Poor Standing balance comment: Heavy reliance on RW for UE support                     Cognition Arousal/Alertness: Lethargic;Suspect due to medications Behavior During Therapy: [facility] for tasks assessed/performed Overall Cognitive Status: Within Functional Limits for tasks assessed                      Exercises Total Joint Exercises Ankle Circles/Pumps: AROM;Both;10 reps;Supine Quad Sets: AROM;Right;10 reps;Supine Heel Slides: Right;10 reps;AAROM;Supine Hip ABduction/ADduction: AAROM;Right;10 reps;Supine Straight Leg Raises: AAROM;Right;10 reps;Supine    General Comments        Pertinent Vitals/Pain Pain Assessment: 0-10 Pain Score: 3  Pain Location: R knee after receiving IV pain meds.   Pain Descriptors / Indicators: Guarding Pain Intervention(s): Monitored during session;Repositioned;Ice applied  Home Living Family/patient expects to be discharged to:: Private residence Living Arrangements: Spouse/significant other Available Help at Discharge: Family;Available 24 hours/day Type of Home: House Home Access: Stairs to enter Entrance Stairs-Rails: Right Home Layout: One level Home  Equipment: Cane - single point Additional Comments: Pt typically uses tub shower    Prior Function Level of Independence: Independent with assistive device(s)      Comments: used cane PTA, was active, drove a bus   PT Goals (current goals can now be found in the care plan section) Acute Rehab PT Goals Patient Stated Goal: to decrease pain PT Goal Formulation: With patient/family Potential to Achieve Goals: Good Progress towards PT goals: Progressing toward goals    Frequency  7X/week    PT Plan      Co-evaluation             End of Session Equipment Utilized During Treatment: Gait belt Activity Tolerance: Patient limited by fatigue Patient left: with call bell/phone within reach;in chair;with family/visitor present     Time: 1138-1203 PT Time Calculation (min) (ACUTE ONLY): 25 min  Charges:  $Gait Training: 8-22 mins $Therapeutic Exercise: 8-22 mins                    G Codes:      Cristela Blue 2015-11-16, 1:00 PM  Governor Rooks, PTA pager [phone]

## 2015-11-02 LAB — PREPARE RBC (CROSSMATCH)

## 2015-11-02 LAB — CBC
HEMATOCRIT: 22.6 % — AB (ref 39.0–52.0)
HEMOGLOBIN: 7.5 g/dL — AB (ref 13.0–17.0)
MCH: 31 pg (ref 26.0–34.0)
MCHC: 33.2 g/dL (ref 30.0–36.0)
MCV: 93.4 fL (ref 78.0–100.0)
Platelets: 148 10*3/uL — ABNORMAL LOW (ref 150–400)
RBC: 2.42 MIL/uL — AB (ref 4.22–5.81)
RDW: 13.6 % (ref 11.5–15.5)
WBC: 8.8 10*3/uL (ref 4.0–10.5)

## 2015-11-02 MED ORDER — SODIUM CHLORIDE 0.9 % IV SOLN
Freq: Once | INTRAVENOUS | Status: AC
  Administered 2015-11-02: 17:00:00 via INTRAVENOUS

## 2015-11-02 NOTE — Progress Notes (Signed)
1st unit of blood started transfusing at 1725H. No untoward reaction/s until this time. Endorsed to night nurse Michelene Heady for follow up.

## 2015-11-02 NOTE — Progress Notes (Addendum)
Called and notified Robert Gardner that the patient had incidence of SOB after coming out from the bathroom, 100% O2 sat at RA but requested O2 [place] for comfort and helps with his breathing, c/o slight dizziness. Notified Robert P. also that patient's Hgb is at 7.5 today. He said he is aware. He then spoke to Dr. Mayer Camel about his current condition. Then [employer]. called me back that they decided to do blood transfusion. Orders taken via phone. Refer to orders.

## 2015-11-02 NOTE — Progress Notes (Signed)
Physical Therapy Treatment Patient Details Name: Robert Gardner MRN: [id number] DOB: Jan 24, 1941 Today's Date: 11/02/2015    History of Present Illness Patient is a 75 y/o male admitted for R TKA PMH positive for HTN, a-fib, Second degree AV block, Mobitz type I, CHF, Bell's palsy, seizure.    PT Comments    Pt's physical presentation remains to limit progression with therapy.  Pt SHOB, coughing, dizziness, brady cardia and O2 sats ranging from 86%-90%.  RN informed and applied O2 HR elevated to 57 bpm from low 40s.    Follow Up Recommendations  Home health PT;Supervision for mobility/OOB     Equipment Recommendations  Rolling walker with 5" wheels;3in1 (PT)    Recommendations for Other Services       Precautions / Restrictions Precautions Precautions: Fall;Knee Precaution Booklet Issued: Yes (comment) Precaution Comments: Reviewed not placing ice pack, pillow, or other object under knee Restrictions Weight Bearing Restrictions: Yes RLE Weight Bearing: Weight bearing as tolerated    Mobility  Bed Mobility Overal bed mobility: Needs Assistance       Supine to sit: Min assist     General bed mobility comments: Pt performed with slow movement and assist to advance operative limb to edge of bed.    Transfers Overall transfer level: Needs assistance Equipment used: Rolling walker (2 wheeled) Transfers: Sit to/from Stand Sit to Stand: Supervision (Supervision to ascend and min guard to descend due to complaints of dizziness.  ) Stand pivot transfers: Mod assist       General transfer comment: Pt required cues for hand placement, trunk control and advancement of RLE forward when sitting edge of bed.    Ambulation/Gait Ambulation/Gait assistance: Min guard Ambulation Distance (Feet): 24 Feet Assistive device: Rolling walker (2 wheeled) Gait Pattern/deviations: Step-to pattern;Trunk flexed;Decreased stride length   Gait velocity interpretation: Below normal speed for  age/gender General Gait Details: Pt required constant cues for forward gaze and sequencing of RW and B LEs.  Gait terminated as pt becames shaky with complaints of dizziness.  HR 40s, and O2 sats RA 86%.  Informed RN who applied O2.     Stairs            Wheelchair Mobility    Modified Rankin (Stroke Patients Only)       Balance                                    Cognition Arousal/Alertness: Lethargic Behavior During Therapy: Restless;Anxious (remains to present  with shortness of breath, RN informed.  ) Overall Cognitive Status: Within Functional Limits for tasks assessed                      Exercises Total Joint Exercises Ankle Circles/Pumps: AROM;Both;10 reps;Supine Quad Sets:  (x3 before experiencing shortness of breath.)    General Comments        Pertinent Vitals/Pain Pain Assessment: 0-10 Pain Score: 6  Pain Location: R knee with movement Pain Descriptors / Indicators: Grimacing;Guarding;Tightness;Sore Pain Intervention(s): Limited activity within patient's tolerance;Monitored during session;Repositioned    Home Living Family/patient expects to be discharged to:: Private residence Living Arrangements: Spouse/significant other                  Prior Function            PT Goals (current goals can now be found in the care plan section) Acute Rehab PT Goals  Patient Stated Goal: to decrease pain Potential to Achieve Goals: Good Progress towards PT goals: Progressing toward goals    Frequency  7X/week    PT Plan Current plan remains appropriate    Co-evaluation             End of Session Equipment Utilized During Treatment: Gait belt Activity Tolerance: Patient limited by fatigue Patient left: with call bell/phone within reach;with family/visitor present;in bed;with nursing/sitter in room     Time: [id number] PT Time Calculation (min) (ACUTE ONLY): 20 min  Charges:  $Gait Training: 8-22 mins                     G Codes:      Cristela Blue 18-Nov-2015, 4:17 PM  Governor Rooks, PTA pager [phone]

## 2015-11-02 NOTE — Care Management Note (Signed)
Case Management Note  Patient Details  Name: Robert Gardner MRN: [id number] Date of Birth: 11-Mar-1941  Subjective/Objective:              S/p right total knee arthroplasty      Action/Plan: Spoke with patient and his wife about discharge plan, gave choice for home health, they chose [facility]. Contacted Edwina at [place] and set up [place], [place] and [place]. [employer] will deliver CPM to home, [employer] delivered rolling walker and 3N1 to patient's room. Patient's wife will be assisting him after discharge.     Expected Discharge Date:                  Expected Discharge Plan:  [place]  In-House Referral:  NA  Discharge planning Services  CM Consult  Post Acute Care Choice:  Durable Medical Equipment, Home Health Choice offered to:  Patient  DME Arranged:  3-N-1, Walker rolling, CPM DME Agency:  [place] [employer]  HH Arranged:  RN, PT, OT HH Agency:  [place]  Status of Service:  Completed, signed off  Medicare Important Message Given:    Date Medicare IM Given:    Medicare IM give by:    Date Additional Medicare IM Given:    Additional Medicare Important Message give by:     If discussed at [place] of Stay Meetings, dates discussed:    Additional Comments:  Nila Nephew, RN 11/02/2015, 2:11 PM

## 2015-11-02 NOTE — Progress Notes (Signed)
PATIENT ID: Robert Gardner  MRN: [id number]  DOB/AGE:  [birth year] / 75 y.o.  2 Days Post-Op Procedure(s) (LRB): RIGHT TOTAL KNEE ARTHROPLASTY (Right)    PROGRESS NOTE Subjective: Patient is alert, oriented, no Nausea, no Vomiting, yes passing gas. Taking PO with small bites. Denies SOB, Chest or Calf Pain. Using Incentive Spirometer, PAS in place. Ambulate WBAT with pt walking 18 ft, CPM 0-40 Patient reports pain as 9/10 .    Objective: Vital signs in last 24 hours: Filed Vitals:   11/01/15 1542 11/01/15 1555 11/01/15 2014 11/02/15 0538  BP: 117/43  103/51 126/58  Pulse: 62  79 81  Temp:   99.4 F (37.4 C) 100.6 F (38.1 C)  TempSrc:   Oral Oral  Resp: 16  15 15   Height:      Weight:      SpO2: 95% 84% 90% 92%      Intake/Output from previous day: I/O last 3 completed shifts: In: 1867.5 [P.O.:480; I.V.:1387.5] Out: 1300 [Urine:1300]   Intake/Output this shift:     LABORATORY DATA:  Recent Labs  10/31/15 0835 11/01/15 0618 11/02/15 0351  WBC  --  6.4 8.8  HGB  --  8.7* 7.5*  HCT  --  25.9* 22.6*  PLT  --  178 148*  NA  --  135  --   K  --  4.2  --   CL  --  102  --   CO2  --  22  --   BUN  --  21*  --   CREATININE  --  1.58*  --   GLUCOSE  --  132*  --   INR 1.09  --   --   CALCIUM  --  8.8*  --     Examination: Neurologically intact Neurovascular intact Sensation intact distally Intact pulses distally Dorsiflexion/Plantar flexion intact Incision: dressing C/D/I No cellulitis present Compartment soft}  Assessment:   2 Days Post-Op Procedure(s) (LRB): RIGHT TOTAL KNEE ARTHROPLASTY (Right) ADDITIONAL DIAGNOSIS: Expected Acute Blood Loss Anemia, Combined systolic and diastolic CHF, history of seizure, history of atrial fibrillation   Plan: PT/OT WBAT, CPM 5/hrs day until ROM 0-90 degrees, then D/C CPM DVT Prophylaxis:  SCDx72hrs, Eliquis 2.5 mg BID x 2 weeks, then restart his normal dose at 5 mg BID DISCHARGE PLAN: Home, though pt will need to make  significant gains in therapy to go home. DISCHARGE NEEDS: HHPT, CPM, Walker and 3-in-1 comode seat     Eliakim Tendler R 11/02/2015, 8:01 AM

## 2015-11-02 NOTE — Progress Notes (Addendum)
Orthopedic Tech Progress Note Patient Details:  Robert Gardner September 22, 1940 [id number] Ortho visit put on cpm at 1900 Patient ID: [name], male   DOB: 03/07/1941, 75 y.o.   MRN: [id number]   Aarjav, Schechter 11/02/2015, 7:02 PM

## 2015-11-02 NOTE — Progress Notes (Addendum)
Physical Therapy Treatment Patient Details Name: Robert Gardner MRN: [id number] DOB: 21-Sep-1940 Today's Date: 11/02/2015    History of Present Illness Patient is a 75 y/o male admitted for R TKA PMH positive for HTN, a-fib, Second degree AV block, Mobitz type I, CHF, Bell's palsy, seizure.    PT Comments    Pt performed decreased mobility, HGB 7.6 to receive 2 units of blood, O2 sats decreased to 80% and HR decreased to 31 bpm, RN informed.  Pt quickly returned to 95% on RA, applied 2L O2 for comfort per nursing.  Will continue to follow up.    Follow Up Recommendations  Home health PT;Supervision for mobility/OOB     Equipment Recommendations  Rolling walker with 5" wheels;3in1 (PT)    Recommendations for Other Services       Precautions / Restrictions Precautions Precautions: Fall;Knee Precaution Booklet Issued: Yes (comment) Precaution Comments: Reviewed not placing ice pack, pillow, or other object under knee Restrictions Weight Bearing Restrictions: Yes RLE Weight Bearing: Weight bearing as tolerated    Mobility  Bed Mobility Overal bed mobility: Needs Assistance       Supine to sit: Min assist Sit to supine: Mod assist (Pt became nauseous after transfering back to bed.  )   General bed mobility comments: Pt performed with slow movement to advance limbs to edge of bed.  Pt required increased time and assist of operative limb to complete.    Transfers Overall transfer level: Needs assistance Equipment used: Rolling walker (2 wheeled) Transfers: Sit to/from Stand Sit to Stand: Supervision;Mod assist (supervision to stand quickly became mod assist after noiticeable  shaking, coughing and complaints of dizziness.  Pt required mod assist to turn and sit in recliner.  After sitting in recliner HR 30 bpm and O2 sats decreased to 80% returns quickly to 95%) Stand pivot transfers: Mod assist       General transfer comment: Pt required cues for hand placement, trunk  control and advancement of RLE forward when sitting edge of bed.    Ambulation/Gait   Stairs            Wheelchair Mobility    Modified Rankin (Stroke Patients Only)       Balance Overall balance assessment: Needs assistance   Sitting balance-Leahy Scale: Fair       Standing balance-Leahy Scale: Poor                      Cognition Arousal/Alertness: Lethargic;Suspect due to medications Behavior During Therapy: Anxious Overall Cognitive Status: Impaired/Different from baseline Area of Impairment: Attention   Current Attention Level: Selective                Exercises    General Comments        Pertinent Vitals/Pain Pain Assessment: 0-10 Pain Score: 7  Pain Location: R knee with movement Pain Descriptors / Indicators: Grimacing;Guarding;Tightness;Sore Pain Intervention(s): Limited activity within patient's tolerance;Repositioned    Home Living Family/patient expects to be discharged to:: Private residence Living Arrangements: Spouse/significant other                  Prior Function            PT Goals (current goals can now be found in the care plan section) Acute Rehab PT Goals Patient Stated Goal: to decrease pain Potential to Achieve Goals: Good Progress towards PT goals: Progressing toward goals    Frequency  7X/week    PT Plan Current plan remains  appropriate    Co-evaluation             End of Session Equipment Utilized During Treatment: Gait belt Activity Tolerance: Patient limited by fatigue Patient left: with call bell/phone within reach;with family/visitor present;in bed;with nursing/sitter in room     Time: [id number] PT Time Calculation (min) (ACUTE ONLY): 23 min  Charges:   $Therapeutic Activity: 8-22 mins                    G Codes:      Cristela Blue 2015/11/06, 5:01 PM Governor Rooks, PTA pager [phone]

## 2015-11-03 ENCOUNTER — Inpatient Hospital Stay (HOSPITAL_COMMUNITY): Payer: 59

## 2015-11-03 DIAGNOSIS — I480 Paroxysmal atrial fibrillation: Secondary | ICD-10-CM

## 2015-11-03 DIAGNOSIS — R001 Bradycardia, unspecified: Secondary | ICD-10-CM

## 2015-11-03 DIAGNOSIS — I10 Essential (primary) hypertension: Secondary | ICD-10-CM

## 2015-11-03 DIAGNOSIS — R079 Chest pain, unspecified: Secondary | ICD-10-CM

## 2015-11-03 DIAGNOSIS — D696 Thrombocytopenia, unspecified: Secondary | ICD-10-CM | POA: Diagnosis present

## 2015-11-03 DIAGNOSIS — D649 Anemia, unspecified: Secondary | ICD-10-CM | POA: Diagnosis present

## 2015-11-03 DIAGNOSIS — I5042 Chronic combined systolic (congestive) and diastolic (congestive) heart failure: Secondary | ICD-10-CM

## 2015-11-03 DIAGNOSIS — N183 Chronic kidney disease, stage 3 unspecified: Secondary | ICD-10-CM | POA: Diagnosis present

## 2015-11-03 LAB — TROPONIN I
TROPONIN I: 0.1 ng/mL — AB (ref <0.031)
Troponin I: 0.1 ng/mL — ABNORMAL HIGH (ref <0.031)

## 2015-11-03 LAB — CBC
HEMATOCRIT: 26.9 % — AB (ref 39.0–52.0)
HEMOGLOBIN: 9.1 g/dL — AB (ref 13.0–17.0)
MCH: 31.3 pg (ref 26.0–34.0)
MCHC: 33.8 g/dL (ref 30.0–36.0)
MCV: 92.4 fL (ref 78.0–100.0)
Platelets: 134 10*3/uL — ABNORMAL LOW (ref 150–400)
RBC: 2.91 MIL/uL — AB (ref 4.22–5.81)
RDW: 14.7 % (ref 11.5–15.5)
WBC: 8.9 10*3/uL (ref 4.0–10.5)

## 2015-11-03 LAB — BRAIN NATRIURETIC PEPTIDE: B NATRIURETIC PEPTIDE 5: 542.7 pg/mL — AB (ref 0.0–100.0)

## 2015-11-03 MED ORDER — HYDRALAZINE HCL 10 MG PO TABS
10.0000 mg | ORAL_TABLET | Freq: Three times a day (TID) | ORAL | Status: DC
  Administered 2015-11-04 (×2): 10 mg via ORAL
  Filled 2015-11-03 (×3): qty 1

## 2015-11-03 NOTE — Progress Notes (Signed)
Discussed recent BNP of 542.7 and Troponin 0.1 with Cardiology PA [facility].  NT performed 12 lead EKG, results given to PA.  Also informed patient's heart rate was in 30's following PT session.  Rapid response RN notified.  Patient had complaints of chest pain and "heaviness" immediately following the PT session.

## 2015-11-03 NOTE — Consult Note (Signed)
Consultation Note   Robert Gardner [id number] DOB: 05-Aug-1940 DOA: 10/31/2015  Requesting MD/NP/PA: Mayer Camel / Orthopedics PCP: Gara Kroner, MD  Patient coming from/Resides at: Private residence with his wife  Reason for consultation: Symptomatic bradycardia  HPI: Robert Gardner is a 75 y.o. male with medical history significant for atrial fibrillation eliquis, chronic diastolic congestive heart failure with mild LV dysfunction, known moderate mitral regurgitation as well as pulmonic regurgitation and mild pulmonary hypertension, hypertension and abnormal stress test for preoperative surgical clearance in March 2017. Patient underwent preoperative cardiac catheterization in April 2017 and this demonstrated no coronary disease (only a minimal 10% luminal irregularity of the mid RCA) and EF was documented at 40-45%. Patient was admitted by the orthopedic team and underwent elective right total knee arthroplasty. He did have some postoperative anemia and required 2 units of packed red blood cells with subsequent improvement in hemoglobin. Today while working with physical therapy the patient was reported as having chest pain and shortness of breath and when his pulse was checked peripherally (radial) and it was noted to be between 30 and 40 bpm. Rapid response was called and the patient was placed on telemetry.  Upon my conversation with the patient he stated while working with PT that his arms became "very weak" and he developed heaviness in the mid chest without any shortness of breath or radiation. He was not dizzy. He did not develop diaphoresis. He was not having any significant pain in his right knee with ambulation. As a precaution supplemental oxygen was placed on the patient while he was having chest discomfort and symptoms although he was not hypoxemic. We have been requested by the orthopedics team to evaluate the patient. Of note upon review of outpatient cardiology documentation Dr. Radford Pax  documented on 09/08/15 that occasionally the patient will have some dizziness when walking   Review of Systems:  In addition to the HPI above,  No Fever-chills, myalgias or other constitutional symptoms No Headache, changes with Vision or hearing, new focal weakness, tingling, numbness in any extremity, No problems swallowing food or Liquids, indigestion/reflux No Cough or Shortness of Breath, palpitations, orthopnea or DOE No Abdominal pain, N/V; no melena or hematochezia, no dark tarry stools, Bowel movements are regular, No dysuria, hematuria or flank pain No new skin rashes, lesions, masses or bruises, No new joints pains-aches No recent weight gain or loss No polyuria, polydypsia or polyphagia,   Past Medical History  Diagnosis Date  . Hypertension   . Combined systolic and diastolic heart failure ([place])     EF 45% by echo 08/2014.  Marland Kitchen Bell palsy   . Erectile dysfunction   . Mobitz (type) I (Wenckebach's) atrioventricular block     a. By Holter 10/2012.  . Mitral regurgitation     Moderate by echo 08/2014  . Atrial flutter ([place])     s/p flutter ablation  . CKD (chronic kidney disease), stage II   . Normocytic anemia   . Thyroid enlargement     a. Abnl xray 09/2013 - possible R thyroid enlargement.   . Second degree AV block, Mobitz type I 09/08/2015  . CHF (congestive heart failure) ([place])   . Shortness of breath dyspnea     with exertion   . Arthritis   . Cancer [facility])     skin cancer right leg-melanoma    Past Surgical History  Procedure Laterality Date  . Resection of melanoma Right     Leg  . Ablation  11-11-2013  CTI ablation by Dr Lovena Le  . Atrial flutter ablation N/A 11/11/2013    Procedure: ATRIAL FLUTTER ABLATION;  Surgeon: Evans Lance, MD;  [facility] CATH LAB;  Service: Cardiovascular;  Laterality: N/A;  . Cardiac catheterization N/A 10/05/2015    Procedure: Left Heart Cath and Coronary Angiography;  Surgeon: Troy Sine, MD;  Location: [place] CV  LAB;  Service: Cardiovascular;  Laterality: N/A;  . Rectal tear      repair  . Cyst removal trunk      like a cyst  . Total knee arthroplasty Right 10/31/2015    Procedure: RIGHT TOTAL KNEE ARTHROPLASTY;  Surgeon: Frederik Pear, MD;  Location: [place];  Service: Orthopedics;  Laterality: Right;     reports that he quit smoking about 21 years ago. He has never used smokeless tobacco. He reports that he does not drink alcohol or use illicit drugs.  Mobility: With a cane preoperatively now with a rolling walker Work history: Not obtained   No Active Allergies  Family History  Problem Relation Age of Onset  . Heart disease Mother     unclear details  . Heart disease Father     unclear details  . Heart disease Sister     unclear details     Prior to Admission medications   Medication Sig Start Date End Date Taking? Authorizing Provider  acetaminophen (TYLENOL) 650 MG CR tablet Take 650 mg by mouth daily as needed for pain.   Yes Historical Provider, MD  amLODipine (NORVASC) 5 MG tablet TAKE ONE TABLET BY MOUTH ONCE DAILY 09/19/15  Yes Sueanne Margarita, MD  furosemide (LASIX) 20 MG tablet Take 1 tablet (20 mg total) by mouth every other day. 09/09/15  Yes Sueanne Margarita, MD  Multiple Vitamin (MULTIVITAMIN) capsule Take 1 capsule by mouth daily.   Yes Historical Provider, MD  potassium chloride SA (K-DUR,KLOR-CON) 20 MEQ tablet Take 1 tablet (20 mEq total) by mouth every other day. Take every other day with Lasix. 09/09/15  Yes Sueanne Margarita, MD  apixaban (ELIQUIS) 2.5 MG TABS tablet Take 1 tablet (2.5 mg total) by mouth 2 (two) times daily. 10/31/15   Leighton Parody, PA-C  ELIQUIS 5 MG TABS tablet TAKE ONE TABLET BY MOUTH TWICE DAILY 10/21/15   Sueanne Margarita, MD  oxyCODONE-acetaminophen (ROXICET) 5-325 MG tablet Take 1 tablet by mouth every 4 (four) hours as needed. 10/31/15   Leighton Parody, PA-C  tiZANidine (ZANAFLEX) 2 MG tablet Take 1 tablet (2 mg total) by mouth every 6 (six) hours as needed  for muscle spasms. 10/31/15   Leighton Parody, PA-C    Physical Exam: Filed Vitals:   11/03/15 0640 11/03/15 1021 11/03/15 1032 11/03/15 1154  BP: 152/62 132/49 117/52 128/52  Pulse: 70 51 32 58  Temp: 99.1 F (37.3 C)     TempSrc: Oral     Resp: 18  16 16   Height:      Weight:      SpO2: 100%  100% 100%      Constitutional: NAD, calm, comfortable Eyes: PERRL, lids and conjunctivae normal ENMT: Mucous membranes are moist. Posterior pharynx clear of any exudate or lesions.Normal dentition.  Neck: normal, supple, no masses, no thyromegaly Respiratory: clear to auscultation bilaterally, no wheezing, no crackles. Normal respiratory effort. No accessory muscle use.  Cardiovascular: Irregular rate and rhythm, systolic murmur left sternal border fourth intercostal spray,  No rubs / gallops. No extremity edema. 2+ pedal pulses. No  carotid bruits.  Abdomen: no tenderness, no masses palpated. No hepatosplenomegaly. Bowel sounds positive.  Musculoskeletal: no clubbing / cyanosis. No joint deformity upper and lower extremities. Good ROM, no contractures. Normal muscle tone. Dressing over right knee extending to the foot without any associated edema and operative leg Skin: no rashes, lesions, ulcers. No induration Neurologic: CN 2-12 grossly intact. Sensation intact, DTR normal. Strength 5/5 x all 4 extremities and just slightly weaker right lower extremity.  Psychiatric: Normal judgment and insight. Alert and oriented x 3. Normal mood.    Labs on Admission: I have personally reviewed following labs and imaging studies  CBC:  Recent Labs Lab 11/01/15 0618 11/02/15 0351 11/03/15 0453  WBC 6.4 8.8 8.9  HGB 8.7* 7.5* 9.1*  HCT 25.9* 22.6* 26.9*  MCV 95.6 93.4 92.4  PLT 178 148* [id number]*   Basic Metabolic Panel:  Recent Labs Lab 11/01/15 0618  NA 135  K 4.2  CL 102  CO2 22  GLUCOSE 132*  BUN 21*  CREATININE 1.58*  CALCIUM 8.8*   GFR: Estimated Creatinine Clearance: 43.9 mL/min  (by C-G formula based on Cr of 1.58). Liver Function Tests: No results for input(s): AST, ALT, ALKPHOS, BILITOT, PROT, ALBUMIN in the last 168 hours. No results for input(s): LIPASE, AMYLASE in the last 168 hours. No results for input(s): AMMONIA in the last 168 hours. Coagulation Profile:  Recent Labs Lab 10/31/15 0835  INR 1.09   Cardiac Enzymes: No results for input(s): CKTOTAL, CKMB, CKMBINDEX, TROPONINI in the last 168 hours. BNP (last 3 results) No results for input(s): PROBNP in the last 8760 hours. HbA1C: No results for input(s): HGBA1C in the last 72 hours. CBG: No results for input(s): GLUCAP in the last 168 hours. Lipid Profile: No results for input(s): CHOL, HDL, LDLCALC, TRIG, CHOLHDL, LDLDIRECT in the last 72 hours. Thyroid Function Tests: No results for input(s): TSH, T4TOTAL, FREET4, T3FREE, THYROIDAB in the last 72 hours. Anemia Panel: No results for input(s): VITAMINB12, FOLATE, FERRITIN, TIBC, IRON, RETICCTPCT in the last 72 hours. Urine analysis:    Component Value Date/Time   COLORURINE YELLOW 10/21/2015 1341   APPEARANCEUR CLEAR 10/21/2015 1341   LABSPEC 1.006 10/21/2015 1341   PHURINE 6.0 10/21/2015 1341   GLUCOSEU NEGATIVE 10/21/2015 1341   HGBUR SMALL* 10/21/2015 1341   BILIRUBINUR NEGATIVE 10/21/2015 1341   KETONESUR NEGATIVE 10/21/2015 1341   PROTEINUR NEGATIVE 10/21/2015 1341   UROBILINOGEN 2.0* 10/30/2011 2054   NITRITE NEGATIVE 10/21/2015 1341   LEUKOCYTESUR NEGATIVE 10/21/2015 1341   Sepsis Labs: @LABRCNTIP (procalcitonin:4,lacticidven:4) )No results found for this or any previous visit (from the past 240 hour(s)).   Radiological Exams on Admission: Dg Chest Port 1 View  11/03/2015  CLINICAL DATA:  Chest pain EXAM: PORTABLE CHEST 1 VIEW COMPARISON:  10/21/2015 FINDINGS: Chronic cardiomegaly and aortic tortuosity. Negative hila. There is no edema, consolidation, effusion, or pneumothorax. No osseous finding to explain acute chest pain.  IMPRESSION: No acute finding.  Chronic cardiomegaly. Electronically Signed   By: Monte Fantasia M.D.   On: 11/03/2015 12:16    EKG: (Independently reviewed) Pending. I have reviewed preoperative EKG from March 2017 that demonstrates sinus rhythm with blocked PACs and multiple PVCs with a prolonged QT interval at 486 ms  Assessment/Plan Principal Problem:   Symptomatic bradycardia -Based on history obtained from staff it appears patient may have had symptomatic bradycardia given reports of dizziness when upright with palpable radial pulse in the 30s to 40s -I have asked the primary team to also consult  the patient's cardiologist -Question if patient was orthostatic so we'll check vital signs lying sitting and standing -Continue telemetry monitoring for recurrent bradycardia; currently is maintaining sinus rhythm with prolonged PR and apparent blocked PACs consistent with previous 12-lead EKG from March -Not on AV nodal blocking agents -Follow-up on EKG -No coronary disease on recent catheterization but for completeness of exam we'll cycle troponins -Recent TSH in March was normal  Active Problems:   Atrial fibrillation  -Currently maintaining sinus rhythm -Continue eliquis -Not on rate control agents -CHADVASc = 3    Hypertension -Continue Norvasc    Chronic combined systolic and diastolic CHF/Mitral regurgitation/ Pulmonary hypertension -Clinically without symptoms of volume overload but has received blood as well as IV fluids during the admission so as precaution we'll check BNP and portable chest x-ray -Continue Lasix but if orthostatic will need to place on hold -Not on ACE inhibitor secondary to chronic kidney disease    CKD III -Renal function stable and at baseline: 21/1.58    Postoperative anemia/thrombocytopenia -This appears to be mostly corrected after receipt of 2 units of packed red blood cells on 5/10 -Baseline hemoglobin around 10.6 with a hemoglobin nadir of 7.5  and current hemoglobin up to 9.1 after blood -Mild decrease in platelets to 134,000 and suspect due to consumption from recent anemia and surgery    Primary osteoarthritis of right knee status post right total knee arthroplasty -Per primary orthopedic team      DVT prophylaxis: Eliquis Code Status: Full code Family Communication:  Wife, Robert Gardner at bedside Disposition Plan: At discretion of primary admission team     Jahdai Padovano L. ANP-BC [facility] Pager [phone]   If 7PM-7AM, please contact night-coverage [email or web address] Password [facility]  11/03/2015, 12:33 PM

## 2015-11-03 NOTE — Progress Notes (Signed)
Orthopedic Tech Progress Note Patient Details:  Robert Gardner 21-Feb-1941 [id number]  CPM Right Knee CPM Right Knee: On Right Knee Flexion (Degrees): 40 Right Knee Extension (Degrees): 0 Additional Comments:  (trapeze bar)   Robert Gardner 11/03/2015, 3:13 PM

## 2015-11-03 NOTE — Progress Notes (Signed)
Following PT session, patient began to experience chest pain and shortness of breath per PT.  Patient placed on 2L oxygen nasal cannula.  Chest pain resolved once in seated position.  Vital signs taken and pulse fluctuated between 30-40s.  Rapid Response notified.  MD/PA notified, spoke with Joanell Rising PA.  Patient placed on telemetry.  MD to consult medical team.  No complaints of pain at this time.  Will continue to monitor.

## 2015-11-03 NOTE — Significant Event (Signed)
Rapid Response Event Note  Overview:  Called to assist with patient with chest pressure and decreased HR Time Called: 1125 Arrival Time: 1130 Event Type: Cardiac  Initial Focused Assessment:  On arrival patient sitting in chair - alert - warm and dry - oriented - currently denies pain or SOB - he states when he was walking with PT he experienced chest heaviness and some SOB with some nausea - he endorses same event yesterday with walking but at that time also felt cold and clammy but denies today.  With rest heaviness went away.  RN Roland Rack at bedside and reports a pulse on the pulse ox in the 30's which correlated with a palpated pulse by herself.  PT present also reports brady event.  BP 128/52 currrent HR 56. Bil BS clear posterior and anterior - slight JVD noted - no pedal edema.     Interventions:  RN on phone with MD - RRT requesting 12 lead EKG and PCXR and telemetry monitoring.  MD gives order for telemetry monitor - he is calling medicine consult.  Placed on telemetry - afib with occ PVC.  Patient currently asymptomatic.  Will follow as needed.     Event Summary: Name of Physician Notified: Dr. Mayer Camel at  (pta RRT)  Name of Consulting Physician Notified: [facility] at  (1205 by Dr. Shaune Spittle team)  Outcome: Stayed in room and stabalized  Event End Time: [place]  Quin Hoop

## 2015-11-03 NOTE — Progress Notes (Signed)
Physical Therapy Treatment Patient Details Name: Robert Gardner MRN: [id number] DOB: [birth year] Today's Date: 11/03/2015    History of Present Illness Patient is a 75 y/o male admitted for R TKA PMH positive for HTN, a-fib, Second degree AV block, Mobitz type I, CHF, Bell's palsy, seizure.    PT Comments    Pt initially doing well with mobility and ambulation until he reported feeling some heaviness in his chest and fatigue. The pt was immediately seated and nursing notified. Pt BP 117/52, SpO2 100%, and pulse dropping to 30s. Pt left in care of nursing following session for further evaluation. Pt reports feeling better once sitting and resting. PT to continue to follow as appropriate.   Follow Up Recommendations  Home health PT;Supervision for mobility/OOB     Equipment Recommendations  Rolling walker with 5" wheels;3in1 (PT)    Recommendations for Other Services       Precautions / Restrictions Precautions Precautions: Fall;Knee Restrictions Weight Bearing Restrictions: Yes RLE Weight Bearing: Weight bearing as tolerated    Mobility  Bed Mobility Overal bed mobility: Needs Assistance Bed Mobility: Supine to Sit     Supine to sit: Supervision     General bed mobility comments: Pt using LLE to assist RLE to sitting.   Transfers Overall transfer level: Needs assistance Equipment used: Rolling walker (2 wheeled) Transfers: Sit to/from Stand Sit to Stand: Min guard         General transfer comment: encouraging knee flexion with transfers. Sit/stand performed from bed and toilet levels.    Ambulation/Gait Ambulation/Gait assistance: Min guard Ambulation Distance (Feet): 40 Feet (40' x1, 10' x1) Assistive device: Rolling walker (2 wheeled) Gait Pattern/deviations: Step-to pattern;Decreased step length - left;Decreased stance time - right;Decreased weight shift to right Gait velocity: decreased   General Gait Details: Encouraging step-through pattern.    Stairs            Wheelchair Mobility    Modified Rankin (Stroke Patients Only)       Balance Overall balance assessment: Needs assistance Sitting-balance support: No upper extremity supported Sitting balance-Leahy Scale: Good     Standing balance support: Bilateral upper extremity supported Standing balance-Leahy Scale: Poor Standing balance comment: using rw for standing.                    Cognition Arousal/Alertness: Awake/alert Behavior During Therapy: WFL for tasks assessed/performed Overall Cognitive Status: Within Functional Limits for tasks assessed                      Exercises Total Joint Exercises Ankle Circles/Pumps: AROM;Both;10 reps;Supine Quad Sets: AROM;Right;10 reps;Supine Heel Slides: Right;10 reps;AAROM;Supine Hip ABduction/ADduction: Strengthening;Right;10 reps Straight Leg Raises: Strengthening;Right;10 reps (mod assist)    General Comments General comments (skin integrity, edema, etc.): During ambulation the patient reported having heaviness in his chest and fatigue. He was immediatly seated and nursing notified. BP 117/52, SpO2 100%, pulse initially 70 bpm and then dropping to 32. Pulse ranging from 32-48 while sitting. Pt reporting that he was feeling better after sitting and resting. Pt left in care of nursing.       Pertinent Vitals/Pain Pain Assessment: 0-10 Pain Score: 5  Pain Location: Rt knee and chest Pain Descriptors / Indicators: Aching;Heaviness Pain Intervention(s): Limited activity within patient's tolerance;Monitored during session (Pt returned to room, nursing notified.)    Home Living                      Prior  Function            PT Goals (current goals can now be found in the care plan section) Acute Rehab PT Goals Patient Stated Goal: do better PT Goal Formulation: With patient/family Time For Goal Achievement: 11/04/15 Potential to Achieve Goals: Good Progress towards PT goals: Progressing toward  goals    Frequency  7X/week    PT Plan Current plan remains appropriate    Co-evaluation             End of Session Equipment Utilized During Treatment: Gait belt Activity Tolerance: Patient limited by fatigue (chest feeling heavy) Patient left: in chair;with call bell/phone within reach;with family/visitor present;with nursing/sitter in room     Time: 1052-1130 PT Time Calculation (min) (ACUTE ONLY): 38 min  Charges:  $Gait Training: 23-37 mins $Therapeutic Exercise: 8-22 mins                    G Codes:      Cassell Clement, PT, CSCS Pager [phone] Office [phone]  11/03/2015, 11:55 AM

## 2015-11-03 NOTE — Progress Notes (Signed)
EKG completed with rhythm more consistent with Wenkebach. Appreciate cardiology assistance.  Erin Hearing, ANP

## 2015-11-03 NOTE — Consult Note (Signed)
CARDIOLOGY CONSULT NOTE   Patient ID: Robert Gardner MRN: [id number], DOB/AGE: 73-Mar-1942   Admit date: 10/31/2015 Date of Consult: 11/03/2015   Primary Physician: Gara Kroner, MD Primary Cardiologist: Dr. Radford Pax  Pt. Profile  [name] is a thin 75 year old African-American male with PMH of atrial fibrillation on eliquis, aflutter s/p ablation, hypertension, moderate MR, and h/o diastolic heart failure who recently underwent cath which showed no significant CAD was admitted for R total knee surgery and during PT training had chest pressure, dizziness and SOB. He was noted to be severely bradycardic.   Problem List  Past Medical History  Diagnosis Date  . Hypertension   . Combined systolic and diastolic heart failure ([place])     EF 45% by echo 08/2014.  Marland Kitchen Bell palsy   . Erectile dysfunction   . Mobitz (type) I (Wenckebach's) atrioventricular block     a. By Holter 10/2012.  . Mitral regurgitation     Moderate by echo 08/2014  . Atrial flutter ([place])     s/p flutter ablation  . CKD (chronic kidney disease), stage II   . Normocytic anemia   . Thyroid enlargement     a. Abnl xray 09/2013 - possible R thyroid enlargement.   . Second degree AV block, Mobitz type I 09/08/2015  . CHF (congestive heart failure) ([place])   . Shortness of breath dyspnea     with exertion   . Arthritis   . Cancer [facility])     skin cancer right leg-melanoma    Past Surgical History  Procedure Laterality Date  . Resection of melanoma Right     Leg  . Ablation  11-11-2013    CTI ablation by Dr Lovena Le  . Atrial flutter ablation N/A 11/11/2013    Procedure: ATRIAL FLUTTER ABLATION;  Surgeon: Evans Lance, MD;  Location: [facility] CATH LAB;  Service: Cardiovascular;  Laterality: N/A;  . Cardiac catheterization N/A 10/05/2015    Procedure: Left Heart Cath and Coronary Angiography;  Surgeon: Troy Sine, MD;  Location: [facility] CV LAB;  Service: Cardiovascular;  Laterality: N/A;  . Rectal tear      repair  .  Cyst removal trunk      like a cyst  . Total knee arthroplasty Right 10/31/2015    Procedure: RIGHT TOTAL KNEE ARTHROPLASTY;  Surgeon: Frederik Pear, MD;  Location: [facility];  Service: Orthopedics;  Laterality: Right;     Allergies  No Active Allergies  HPI   [name] is a thin 75 year old African-American male with PMH of atrial fibrillation on eliquis, aflutter s/p ablation, hypertension, moderate MR, and h/o diastolic heart failure. He has a long standing history of type I second-degree AV block. He also had history of LV dysfunction with baseline EF around 45% since 2014. His most recent echocardiogram obtained on 09/23/2015 showed EF 45-50%, no RWMA, moderate MR, PA peak pressure 58 mmHg. Dr. Radford Pax recommended PFT and CT angiogram to rule out chronic PE and measure the change in DLCO. CTA of the chest obtained on 4/6 showed no evidence of acute or chronic PE, left upper lobe groundglass nodule identified measuring 1.6 cm, emphysema. He also wore a recent heart monitor in March which showed sinus bradycardia with heart rates ranging from 40-80, occasional PVC and PACs with atrial bigeminy. He appears to have second-degree type I Wenckebach AV block, which degraded into second-degree type II AV block during sleep. As part of preoperative clearance for right total knee replacement surgery, he underwent recent Myoview  on 09/23/2015 which showed a large severe intensity defect in the entire inferior and inferolateral wall that is fixed consistent with prior MI. Patient eventually underwent cardiac catheterization on 10/05/2015 which showed 10% mid RCA stent lesion, otherwise no significant CAD, probable nonischemic cardiomyopathy, LVEDP 26 mmHg.  He was cleared for surgery and underwent right total knee arthroplasty by Dr. Mayer Camel on 10/31/2015. He has been recovering well until yesterday. He has been getting up to walk with physical therapy when he had chest pressure, SOB and dizziness. He does not feel like he  was about to pass out. And he denies any recent syncope either. He says he has been noticing increasing amount of dizziness especially with walking in the past 2-3 months. He denies any dizziness at rest. Telemetry shows patient's heart rate is in the 30s to 40s. Internal medicine was consulted who contacted the cardiology service for evaluation of bradycardia.  Inpatient Medications  . amLODipine  5 mg Oral Daily  . apixaban  2.5 mg Oral Q12H  . docusate sodium  100 mg Oral BID  . furosemide  20 mg Oral QODAY  . potassium chloride SA  20 mEq Oral QODAY    Family History Family History  Problem Relation Age of Onset  . Heart disease Mother     unclear details  . Heart disease Father     unclear details  . Heart disease Sister     unclear details     Social History Social History   Social History  . Marital Status: Married    Spouse Name: N/A  . Number of Children: N/A  . Years of Education: N/A   Occupational History  . Not on file.   Social History Main Topics  . Smoking status: Former Smoker -- 56 years    Quit date: 12/21/1993  . Smokeless tobacco: Never Used  . Alcohol Use: No  . Drug Use: No  . Sexual Activity: No   Other Topics Concern  . Not on file   Social History Narrative     Review of Systems  General:  No chills, fever, night sweats or weight changes.  Cardiovascular:  No edema, orthopnea, palpitations, paroxysmal nocturnal dyspnea. + Chest pressure, SOB, dizziness Dermatological: No rash, lesions/masses Respiratory: No cough, dyspnea Urologic: No hematuria, dysuria Abdominal:   No nausea, vomiting, diarrhea, bright red blood per rectum, melena, or hematemesis Neurologic:  No visual changes, wkns, changes in mental status. All other systems reviewed and are otherwise negative except as noted above.  Physical Exam  Blood pressure 128/52, pulse 58, temperature 99.1 F (37.3 C), temperature source Oral, resp. rate 16, height 5\' 11"  (1.803 m),  weight 162 lb (73.483 kg), SpO2 100 %.  General: Pleasant, NAD Psych: Normal affect. Neuro: Alert and oriented X 3. Moves all extremities spontaneously. HEENT: Normal  Neck: Supple without bruits  +JVD. Lungs:  Resp regular and unlabored, CTA. Heart: irregular. no s3, s4. 2/6 systolic murmur Abdomen: Soft, non-tender, non-distended, BS + x 4.  Extremities: No clubbing, cyanosis or edema. DP/PT/Radials 2+ and equal bilaterally.  Labs   Recent Labs  11/03/15 1210  TROPONINI 0.10*   Lab Results  Component Value Date   WBC 8.9 11/03/2015   HGB 9.1* 11/03/2015   HCT 26.9* 11/03/2015   MCV 92.4 11/03/2015   PLT 134* 11/03/2015    Recent Labs Lab 11/01/15 0618  NA 135  K 4.2  CL 102  CO2 22  BUN 21*  CREATININE 1.58*  CALCIUM 8.8*  GLUCOSE 132*   Lab Results  Component Value Date   CHOL 122 10/10/2013   HDL 69 10/10/2013   LDLCALC 46 10/10/2013   TRIG 34 10/10/2013   Lab Results  Component Value Date   DDIMER 0.66* 10/09/2013    Radiology/Studies  Dg Chest 2 View  10/21/2015  CLINICAL DATA:  Hypertension, preop for right total knee arthroplasty. EXAM: CHEST  2 VIEW COMPARISON:  April 11, 2014. FINDINGS: The heart size and mediastinal contours are within normal limits. Both lungs are clear. The visualized skeletal structures are unremarkable. IMPRESSION: No active cardiopulmonary disease. Electronically Signed   By: Marijo Conception, M.D.   On: 10/21/2015 13:58   Dg Chest Port 1 View  11/03/2015  CLINICAL DATA:  Chest pain EXAM: PORTABLE CHEST 1 VIEW COMPARISON:  10/21/2015 FINDINGS: Chronic cardiomegaly and aortic tortuosity. Negative hila. There is no edema, consolidation, effusion, or pneumothorax. No osseous finding to explain acute chest pain. IMPRESSION: No acute finding.  Chronic cardiomegaly. Electronically Signed   By: Monte Fantasia M.D.   On: 11/03/2015 12:16    ECG  Second-degree type I heart block, overall low voltage, possible Q-wave in inferior  lead, T wave inversion in lateral leads. Although R wave is less noticeable compared to previous EKG, T wave inversion is unchanged.  ASSESSMENT AND PLAN  1. Bradycardia:   - Patient has been having significant bradycardia with heart rate in the 40s-60s, he also has been noticing dizziness with ambulation.  - The question is really whether or not his symptom is related to bradycardia, he is not on any rate control medication. The fact that he has been having majority of his dizziness during ambulation is concerning for chronotropic incompetence given underlying Wenckebach second-degree AV block. Recent heart monitor obtained as outpatient also showed Mobitz type II heart block during sleep.  - It was originally recommended for the patient to undergo sleep study, wife denies any snoring nor has she noticed any apnea episode during sleep. I'm not sure how much CPAP will improve his overall heart rate.  - Will discuss with M.D., sooner or later this patient likely will require pacemaker.  2. Chest pressure with walking and mildly elevated trop  - Although patient had 2 episodes of chest pressure during PT UL and training yesterday and today, his troponin is only mildly elevated at 0.1 in the setting of significant anemia and recent surgery.  - Note recent cardiac cath in April 2017 showed no significant CAD. Would not pursue any further workup. If has recurrent symptom, may repeat another troponin, but troponin trend likely will be flat representing demand ischemia.  3. R total knee arthroplasty: Per orthopedic  4. atrial fibrillation on eliquis  - CHA2DS2-Vasc score 3 (age, HTN, HF)  - no obvious recurrence, current in NSR  5. aflutter s/p ablation  6. Hypertension  7. moderate MR  8. history of diastolic heart failure: He is 2.4 L positive since admission. Despite having elevated BNP, his lung clear, no obvious lower extremity edema, I do not think he is significantly fluid overloaded at  this time. He does have very much elevated JVD, this may be chronic. He is on qOD lasix  Signed, Almyra Deforest, PA-C 11/03/2015, 2:21 PM

## 2015-11-03 NOTE — Progress Notes (Signed)
OT Cancellation Note  Patient Details Name: Robert Gardner MRN: [id number] DOB: 1940/10/06   Cancelled Treatment:    Reason Eval/Treat Not Completed: Medical issues which prohibited therapy. Per PT, pt and RN, cardiology is following pt to determine need for pacemaker. Will hold OT treatment at this time until medically cleared by cardiology for activity.   Redmond Baseman, OTR/L Pager: [phone] 11/03/2015, 3:45 PM

## 2015-11-03 NOTE — Progress Notes (Signed)
Physical Therapy Treatment Patient Details Name: Robert Gardner MRN: [id number] DOB: 01/12/1941 Today's Date: 11/03/2015    History of Present Illness Patient is a 75 y/o male admitted for R TKA PMH positive for HTN, a-fib, Second degree AV block, Mobitz type I, CHF, Bell's palsy, seizure.    PT Comments    Discussed plan with nursing prior to session, patient may be getting a pacemaker but awaiting further evaluation at this time. Elected to hold on ambulation and focus on exercises during second session. PT to continue to follow as appropriate.   Follow Up Recommendations  Home health PT;Supervision for mobility/OOB     Equipment Recommendations  Rolling walker with 5" wheels;3in1 (PT)    Recommendations for Other Services       Precautions / Restrictions Precautions Precautions: Fall;Knee Precaution Comments: bradycardia Restrictions Weight Bearing Restrictions: Yes RLE Weight Bearing: Weight bearing as tolerated    Mobility  Bed Mobility                  Transfers                    Ambulation/Gait                 Stairs            Wheelchair Mobility    Modified Rankin (Stroke Patients Only)       Balance                                    Cognition Arousal/Alertness: Awake/alert Behavior During Therapy: WFL for tasks assessed/performed Overall Cognitive Status: Within Functional Limits for tasks assessed                      Exercises Total Joint Exercises Ankle Circles/Pumps: AROM;Both;10 reps;Supine Quad Sets: AROM;Right;10 reps;Supine Short Arc Quad: Strengthening;Right;10 reps (min assist) Heel Slides: Right;10 reps;AAROM;Supine Hip ABduction/ADduction: Strengthening;Right;10 reps Straight Leg Raises: Strengthening;Right;10 reps (min assist) Goniometric ROM: approx 70 degrees knee flexion.    General Comments        Pertinent Vitals/Pain Pain Assessment: Faces Faces Pain Scale: Hurts  little more Pain Location: Rt knee Pain Descriptors / Indicators: Grimacing Pain Intervention(s): Monitored during session;Limited activity within patient's tolerance    Home Living                      Prior Function            PT Goals (current goals can now be found in the care plan section) Acute Rehab PT Goals Patient Stated Goal: find out what the plan is.  PT Goal Formulation: With patient/family Time For Goal Achievement: 11/04/15 Potential to Achieve Goals: Good Progress towards PT goals: Progressing toward goals    Frequency  7X/week    PT Plan Current plan remains appropriate    Co-evaluation             End of Session   Activity Tolerance: Patient tolerated treatment well Patient left: in chair;with call bell/phone within reach;with family/visitor present     Time: [id number] PT Time Calculation (min) (ACUTE ONLY): 17 min  Charges:  $Therapeutic Exercise: 8-22 mins                    G Codes:      Cassell Clement, PT, CSCS Pager [phone] Office [phone]  11/03/2015, 3:59 PM

## 2015-11-03 NOTE — Progress Notes (Signed)
Patient ID: Robert Gardner, male   DOB: [birth year], 75 y.o.   MRN: [id number] PATIENT ID: Robert Gardner  MRN: [id number]  DOB/AGE:  [birth year] / 75 y.o.  3 Days Post-Op Procedure(s) (LRB): RIGHT TOTAL KNEE ARTHROPLASTY (Right)    PROGRESS NOTE Subjective: Patient is alert, oriented, no Nausea, no Vomiting, yes passing gas, . Taking PO well. Denies SOB, Chest or Calf Pain. Using Incentive Spirometer, PAS in place. Ambulate ABAT, feels much better after 2 units of packed red blood cells yesterday, prior to transfusion he was getting dizziness when he stood up he does have a history of systolic and diastolic congestive heart failure. During morning rounds I observed him getting out of bed independently and using a walker going over to the commode for what will probably be his first bowel movement. He ambulated without difficulty. Patient reports pain as  4/10  .    Objective: Vital signs in last 24 hours: Filed Vitals:   11/02/15 2130 11/02/15 2150 11/03/15 0022 11/03/15 0640  BP: 119/60 130/49 136/55 152/62  Pulse: 66 100 65 70  Temp: 98.7 F (37.1 C) 99.4 F (37.4 C) 99.5 F (37.5 C) 99.1 F (37.3 C)  TempSrc: Oral Oral Oral Oral  Resp: 18 16 18 18   Height:      Weight:      SpO2: 93% 97% 100% 100%      Intake/Output from previous day: I/O last 3 completed shifts: In: 1505 [P.O.:840; Blood:665] Out: 250 [Urine:250]   Intake/Output this shift:     LABORATORY DATA:  Recent Labs  10/31/15 0835  11/01/15 0618 11/02/15 0351 11/03/15 0453  WBC  --   < > 6.4 8.8 8.9  HGB  --   < > 8.7* 7.5* 9.1*  HCT  --   < > 25.9* 22.6* 26.9*  PLT  --   < > 178 148* 134*  NA  --   --  135  --   --   K  --   --  4.2  --   --   CL  --   --  102  --   --   CO2  --   --  22  --   --   BUN  --   --  21*  --   --   CREATININE  --   --  1.58*  --   --   GLUCOSE  --   --  132*  --   --   INR 1.09  --   --   --   --   CALCIUM  --   --  8.8*  --   --   < > = values in this interval not  displayed.  Examination: Neurologically intact ABD soft Neurovascular intact Sensation intact distally Intact pulses distally Dorsiflexion/Plantar flexion intact Incision: scant drainage No cellulitis present Compartment soft} XR AP&Lat of hip shows well placed\fixed THA  Assessment:   3 Days Post-Op Procedure(s) (LRB): RIGHT TOTAL KNEE ARTHROPLASTY (Right) ADDITIONAL DIAGNOSIS:  Expected Acute Blood Loss Anemia, Hypertension and Cardiac Arrythmia Atrial fibrillation, history of systolic and diastolic heart failure, history of second-degree AV block,  Plan: PT/OT WBAT, THA  DVT Prophylaxis: SCDx72 hrs, Ellaquist 2.5 mg by mouth twice a day until I see him back in the office and then get him back on 5 mg by mouth twice a day  DISCHARGE PLAN: Home, assuming he does well with physical therapy today  DISCHARGE NEEDS: HHPT, CPM, Walker and 3-in-1  comode seat

## 2015-11-03 NOTE — Consult Note (Signed)
ELECTROPHYSIOLOGY CONSULT NOTE    Patient ID: Jaivian Guillot MRN: [id number], DOB/AGE: 1940/11/13 75 y.o.  Admit date: 10/31/2015 Date of Consult: 11/03/2015  Primary Physician: Gara Kroner, MD Primary Cardiologist: Dr. Radford Pax Electrophysiologist: Dr. Lovena Le Requesting MD: Dr. Radford Pax  Reason for Consultation: bradycardia, heart block  HPI: Johnatan Lyndon is a 75 y.o. male at [facility] for elective R knee surgery, he underwent RIGHT TOTAL KNEE ARTHROPLASTY on 10/31/15.   He has PMHx of PAFlutter with ablation 11/12/15 by Dr. Lovena Le, notes also mention PAfib and remains on Eliquis at home, HTN, known hx of 2nd degree AVBlock type one, seizure d/o, and Bell's palsey.  He was ambulating with PT became dizzy and record reports BP 94/38 and HR 49, once supine/trendelenburg 152/87.  Follo up PT evaluation describe dizziness with ambulation that resolves with rest, multiple entries by PT report c/o SOB, decreased saturations and bradycardia, also noted is his Hgb of 7.5 and had 2 u PRBC, today despite transfusion and hgb up to 9.1, with ambulation he developed some chest heaviness and PT noted HR dipping into 30's with O2 sat 100% and BP 117/52, resolution of CP with sitting.  He was placed on telemetry today, cardiology was asked to evaluate, given extensive pre-op evaluation including cath with minimal plaquing, EP was asked to evaluate for his heart block and bradycardia.  The patient since about Feb of this year has been for the most part using a wheel chair for mobility secondary to his knee, though prior to this for the last year has been getting SOB and lightheaded when walking more then about 30 feet, often having to stop and sit to feel better, he denies over syncope.   Past Medical History  Diagnosis Date  . Hypertension   . Combined systolic and diastolic heart failure ([place])     EF 45% by echo 08/2014.  Marland Kitchen Bell palsy   . Erectile dysfunction   . Mobitz (type) I (Wenckebach's) atrioventricular  block     a. By Holter 10/2012.  . Mitral regurgitation     Moderate by echo 08/2014  . Atrial flutter ([place])     s/p flutter ablation  . CKD (chronic kidney disease), stage II   . Normocytic anemia   . Thyroid enlargement     a. Abnl xray 09/2013 - possible R thyroid enlargement.   . Second degree AV block, Mobitz type I 09/08/2015  . CHF (congestive heart failure) ([place])   . Shortness of breath dyspnea     with exertion   . Arthritis   . Cancer [facility])     skin cancer right leg-melanoma     Surgical History:  Past Surgical History  Procedure Laterality Date  . Resection of melanoma Right     Leg  . Ablation  11-11-2013    CTI ablation by Dr Lovena Le  . Atrial flutter ablation N/A 11/11/2013    Procedure: ATRIAL FLUTTER ABLATION;  Surgeon: Evans Lance, MD;  Location: [facility] CATH LAB;  Service: Cardiovascular;  Laterality: N/A;  . Cardiac catheterization N/A 10/05/2015    Procedure: Left Heart Cath and Coronary Angiography;  Surgeon: Troy Sine, MD;  Location: [place] CV LAB;  Service: Cardiovascular;  Laterality: N/A;  . Rectal tear      repair  . Cyst removal trunk      like a cyst  . Total knee arthroplasty Right 10/31/2015    Procedure: RIGHT TOTAL KNEE ARTHROPLASTY;  Surgeon: Frederik Pear, MD;  Location: [place];  Service: Orthopedics;  Laterality: Right;     Prescriptions prior to admission  Medication Sig Dispense Refill Last Dose  . acetaminophen (TYLENOL) 650 MG CR tablet Take 650 mg by mouth daily as needed for pain.   Past Week at Unknown time  . amLODipine (NORVASC) 5 MG tablet TAKE ONE TABLET BY MOUTH ONCE DAILY 180 tablet 3 10/30/2015 at Unknown time  . furosemide (LASIX) 20 MG tablet Take 1 tablet (20 mg total) by mouth every other day. 15 tablet 11 Past Week at Unknown time  . Multiple Vitamin (MULTIVITAMIN) capsule Take 1 capsule by mouth daily.   Past Week at Unknown time  . potassium chloride SA (K-DUR,KLOR-CON) 20 MEQ tablet Take 1 tablet (20 mEq total) by mouth  every other day. Take every other day with Lasix. 15 tablet 11 Past Week at Unknown time  . ELIQUIS 5 MG TABS tablet TAKE ONE TABLET BY MOUTH TWICE DAILY 60 tablet 5 10/26/2015    Inpatient Medications:  . apixaban  2.5 mg Oral Q12H  . docusate sodium  100 mg Oral BID  . furosemide  20 mg Oral QODAY  . hydrALAZINE  10 mg Oral Q8H  . potassium chloride SA  20 mEq Oral QODAY    Allergies: No Active Allergies  Social History   Social History  . Marital Status: Married    Spouse Name: N/A  . Number of Children: N/A  . Years of Education: N/A   Occupational History  . Not on file.   Social History Main Topics  . Smoking status: Former Smoker -- 59 years    Quit date: 12/21/1993  . Smokeless tobacco: Never Used  . Alcohol Use: No  . Drug Use: No  . Sexual Activity: No   Other Topics Concern  . Not on file   Social History Narrative     Family History  Problem Relation Age of Onset  . Heart disease Mother     unclear details  . Heart disease Father     unclear details  . Heart disease Sister     unclear details     Review of Systems: All other systems reviewed and are otherwise negative except as noted above.  Physical Exam: Filed Vitals:   11/03/15 1021 11/03/15 1032 11/03/15 1154 11/03/15 1431  BP: 132/49 117/52 128/52 133/57  Pulse: 51 32 58 57  Temp:    99.2 F (37.3 C)  TempSrc:    Oral  Resp:  16 16 17   Height:      Weight:      SpO2:  100% 100% 96%    GEN- The patient is well appearing, in NAD, alert and oriented x 3 today.   HEENT: normocephalic, atraumatic; sclera clear, conjunctiva pink; hearing intact; oropharynx clear; neck supple, no JVP Lymph- no cervical lymphadenopathy Lungs- Clear to ausculation bilaterally, normal work of breathing.  No wheezes, rales, rhonchi Heart- Regular rate and rhythm, extrasystoles noted, bradycardic, no significant murmurs, rubs or gallops, PMI not laterally displaced GI- soft, non-tender, non-distended, bowel  sounds present Extremities- no clubbing, cyanosis, or edema, RLE has ACE wrap MS- no significant deformity or atrophy Skin- warm and dry, no rash or lesion Psych- euthymic mood, full affect Neuro- no gross deficits observed  Labs:   Lab Results  Component Value Date   WBC 8.9 11/03/2015   HGB 9.1* 11/03/2015   HCT 26.9* 11/03/2015   MCV 92.4 11/03/2015   PLT 134* 11/03/2015    Recent Labs Lab 11/01/15 0618  NA 135  K 4.2  CL 102  CO2 22  BUN 21*  CREATININE 1.58*  CALCIUM 8.8*  GLUCOSE 132*      Radiology/Studies:   Dg Chest Port 1 View 11/03/2015  CLINICAL DATA:  Chest pain EXAM: PORTABLE CHEST 1 VIEW COMPARISON:  10/21/2015 FINDINGS: Chronic cardiomegaly and aortic tortuosity. Negative hila. There is no edema, consolidation, effusion, or pneumothorax. No osseous finding to explain acute chest pain. IMPRESSION: No acute finding.  Chronic cardiomegaly. Electronically Signed   By: Monte Fantasia M.D.   On: 11/03/2015 12:16    EKG: SR, second degree AVblock type I (wenckbach) TELEMETRY: SR with second degree AVBlock type I and no clear higher AVblocknoted, with rates into 40's, occ-frequent PVC's 09/23/15: Echocardiogram Study Conclusions - Left ventricle: The cavity size was normal. There was mild  concentric hypertrophy. Systolic function was mildly reduced. The  estimated ejection fraction was in the range of 45% to 50%. Wall  motion was normal; there were no regional wall motion  abnormalities. - Aortic valve: There was trivial regurgitation. - Mitral valve: There was moderate regurgitation. - Right ventricle: The cavity size was mildly dilated. Wall  thickness was normal. - Pulmonic valve: There was trivial regurgitation. - Pulmonary arteries: PA peak pressure: 58 mm Hg (S). Impressions: - Mildly reduced LVF with EF 45%, moderate MR, mildly thickened MV  leaflets, mildly thickened AV leaflets, moderate pulmonary HTN.  Compared to prior study, PASP has  increased from 67mmHg to  32mmHg. The right ventricular systolic pressure was increased  consistent with moderate pulmonary hypertension.  09/23/15: 30 day monitor Study Highlights     Sinus Bradycardia and normal sinus rhythm with heart rate ranging from 40 to 80bpm.  Occasional PACs and atrial bigeminy  Second degree Type I Wenchebach AV block  Occasional PVCs, ventricular triplet, bigeminal PVCs  second degree Type II AV block during sleep  Accelerated idioventricular rhythm for 4 beats    10/05/15: LHC (done secondary to abnormal stress test) Procedures    Left Heart Cath and Coronary Angiography    Conclusion     Mid RCA to Dist RCA lesion, 10% stenosed.  No significant coronary obstructive disease with only minimal 10% luminal irregularity of the mid RCA proximal to the region of the acute margin.  Probable nonischemic cardiopathy with previous noninvasive assessment of LV function at 40-45% ejection fraction. LVEDP today was 26 mm Hg.  RECOMMENDATION: No significant coronary obstructive disease was demonstrated. The patient will be given preoperative clearance for his knee surgery. Eliquis will be reinstituted tomorrow for his chronic anticoagulation and will need to be held 48 hours prior to his surgical procedure.   11/11/13: EPS/Ablation Conclusion. Successful radiofrequency catheter ablation of typical atrial flutter. The patient has persistence of underlying AV conduction system disease, but had no absolute indication for permanent pacemaker insertion at this time.  Assessment and Plan:  1. Dizziness and CP with ambulation     Bradycardia into the 30's have been charted by PT associated with his symptoms     Hypotension and low sats that were also observedseem to have resolved after PRBC yesterday     Subsequently had recurrent symptoms with ambulation noting bradycardia with normal BP and O2 sat 100% Known hx of wenckebach for the patient, over the last  year he has had increasing c/o exertional dizziness and and for the first time today some chest pressure, he had an extensive w/u including cardiac cath as noted above He was on amlodipine, this  has been stopped today, otherwise is on no rate limiting medicines or nodal blocking agents  Recommend ambulate with assist while on telemetry and evaluate his HR and rhythm, also given emphysema on his CT scan and hx of long standing smoker also continue to monitor O2 sats.  2. PAFlutter     S/p ablation 11/11/13     Records indicat PAFib as well, CHA2DS2Vasc is at least 2 on Eliquis at home, low dose here post-op  3. HTN     stable   Signed, Tommye Standard, PA-C 11/03/2015 4:03 PM   EP Attending  Patient seen and examined. Agree with the findings as noted above. The patient is well known to me from atrial flutter ablation in the past who was admitted and underwent knee replacement surgery and has had periods of bradycardia. He has known conduction system disease documented during his flutter ablation. He has had PVC's as well and these are asymptomatic. The patient has not had frank syncope. On tele monitoring he has AVWB and may have occaisional episode of 2:1 AV block, though with his PVC's, difficult to know. On exam he is a well appearing 75 yo man NAD, P - 30-40, BP - 150/72, R - 16. CV IRegular rhythm. Lungs are clear, extremities are wrapped. Labs reviewed. ECG - NSR with 3:2 AVB.  A/P 1. Intrinsic conduction system disease - he has not had frank syncope. I would suggest that the patient be ambulated tomorrow while on tele. His blood pressure can be check, along with oxygen saturation. If he has worsening AV conduction with exertion, then PPM is recommended. If conduction does not worsen, then no PPM. At some point he may need AA drug therapy for treatment of his PVC's.  2. Atrial flutter - he is s/p ablation with no obvious recurrent atrial arrhythmias.  Mikle Bosworth.D.

## 2015-11-03 NOTE — Progress Notes (Signed)
Orthopedic Tech Progress Note Patient Details:  Robert Gardner July 19, 1940 [id number]  Patient ID: [name], male   DOB: 1940-12-27, 75 y.o.   MRN: [id number] Pt refused cpm  Karolee Stamps 11/03/2015, 6:02 AM

## 2015-11-04 DIAGNOSIS — N183 Chronic kidney disease, stage 3 (moderate): Secondary | ICD-10-CM

## 2015-11-04 LAB — TYPE AND SCREEN
ABO/RH(D): B POS
ANTIBODY SCREEN: NEGATIVE
UNIT DIVISION: 0
UNIT DIVISION: 0

## 2015-11-04 LAB — TROPONIN I: TROPONIN I: 0.09 ng/mL — AB (ref <0.031)

## 2015-11-04 NOTE — Progress Notes (Signed)
Occupational Therapy Treatment/Discharge Patient Details Name: Robert Gardner MRN: [id number] DOB: 1940-11-06 Today's Date: 11/04/2015    History of present illness Patient is a 75 y/o male admitted for R TKA PMH positive for HTN, a-fib, Second degree AV block, Mobitz type I, CHF, Bell's palsy, seizure.   OT comments  Pt progressing very well and was asymptomatic of any cardiac related issues during session today. Pt completed LB ADLs and transfers with min guard-min assist which his wife is able to provide. Educated pt/wife on pain management, energy conservation, and fall prevention strategies. All education has been completed and pt has no further questions. Pt with no further acute OT needs. OT signing off.   Follow Up Recommendations  Home health OT;Supervision/Assistance - 24 hour    Equipment Recommendations  3 in 1 bedside comode;Other (comment) (RW- 2 wheeled)    Recommendations for Other Services      Precautions / Restrictions Precautions Precautions: Fall;Knee Precaution Booklet Issued: No Precaution Comments: bradycardia; reviewed not placing ice pack, pillow or other object under knee Restrictions Weight Bearing Restrictions: Yes RLE Weight Bearing: Weight bearing as tolerated       Mobility Bed Mobility               General bed mobility comments: Pt up on chair on OT arrival  Transfers Overall transfer level: Needs assistance Equipment used: Rolling walker (2 wheeled) Transfers: Sit to/from Stand Sit to Stand: Min guard         General transfer comment: Min guard for safety and balance. Verbal cues for safe hand placement on seated surfaces.    Balance Overall balance assessment: Needs assistance Sitting-balance support: No upper extremity supported;Feet supported Sitting balance-Leahy Scale: Good     Standing balance support: Bilateral upper extremity supported;During functional activity Standing balance-Leahy Scale: Fair Standing balance  comment: Able to maintain static standing balance to complete dressing tasks                   ADL Overall ADL's : Needs assistance/impaired         Upper Body Bathing: Supervision/ safety;Sitting   Lower Body Bathing: Min guard;Sit to/from stand   Upper Body Dressing : Supervision/safety;Sitting   Lower Body Dressing: Minimal assistance;Cueing for compensatory techniques;Sit to/from stand Lower Body Dressing Details (indicate cue type and reason): Cues to dress RLE first and undress it last; wife able to assist starting underwear/pants over R foot Toilet Transfer: Min guard;Cueing for safety;BSC;RW;Ambulation Toilet Transfer Details (indicate cue type and reason): BSC over toilet, cues to feel BSC on back of legs before reaching back to sit Toileting- Water quality scientist and Hygiene: Min guard;Sit to/from stand       Functional mobility during ADLs: Min guard;Rolling walker General ADL Comments: Reviewed precautions, compensatory strategies, pain management, and fall prevention strategies. Pt's wife and daughter present for OT session.      Vision                     Perception     Praxis      Cognition   Behavior During Therapy: [facility] for tasks assessed/performed Overall Cognitive Status: Within Functional Limits for tasks assessed                       Extremity/Trunk Assessment               Exercises     Shoulder Instructions       General Comments  Pertinent Vitals/ Pain       Pain Assessment: 0-10 Pain Score: 2  Faces Pain Scale: Hurts a little bit Pain Location: R knee Pain Descriptors / Indicators: Sore Pain Intervention(s): Limited activity within patient's tolerance;Monitored during session;Repositioned  Home Living                                          Prior Functioning/Environment              Frequency       Progress Toward Goals  OT Goals(current goals can now be found in the  care plan section)  Progress towards OT goals: Goals met/education completed, patient discharged from OT  Acute Rehab OT Goals Patient Stated Goal: Feel better OT Goal Formulation: With patient Time For Goal Achievement: 11/15/15 Potential to Achieve Goals: Good ADL Goals Pt Will Perform Grooming: with modified independence;standing Pt Will Perform Lower Body Bathing: with supervision;sit to/from stand Pt Will Perform Lower Body Dressing: with modified independence;sit to/from stand Pt Will Transfer to Toilet: with modified independence;ambulating;bedside commode Pt Will Perform Toileting - Clothing Manipulation and hygiene: with modified independence;sitting/lateral leans;sit to/from stand  Plan All goals met and education completed, patient discharged from OT services    Co-evaluation                 End of Session Equipment Utilized During Treatment: Gait belt;Rolling walker   Activity Tolerance Patient tolerated treatment well   Patient Left in chair;with call bell/phone within reach;with family/visitor present   Nurse Communication Mobility status        Time: 6979-4801 OT Time Calculation (min): 14 min  Charges: OT General Charges $OT Visit: 1 Procedure OT Treatments $Self Care/Home Management : 8-22 mins  Redmond Baseman, OTR/L Pager: [phone] 11/04/2015, 3:51 PM

## 2015-11-04 NOTE — Progress Notes (Signed)
    SUBJECTIVE: The patient is doing well today.  At this time, he denies chest pain, shortness of breath, or any new concerns.  Reports that he walked down the hall earlier and this afternoon did stairs, both times without any symptoms.  Marland Kitchen apixaban  2.5 mg Oral Q12H  . docusate sodium  100 mg Oral BID  . furosemide  20 mg Oral QODAY  . hydrALAZINE  10 mg Oral Q8H  . potassium chloride SA  20 mEq Oral QODAY      OBJECTIVE: Physical Exam: Filed Vitals:   11/03/15 1840 11/03/15 2153 11/04/15 0430 11/04/15 1409  BP: 122/50 122/69 152/63 124/55  Pulse: 57 57 67 59  Temp: 99.2 F (37.3 C) 99 F (37.2 C) 99.1 F (37.3 C) 98.4 F (36.9 C)  TempSrc: Oral Oral Oral   Resp:  16 18 16   Height:      Weight:      SpO2: 100% 95% 95% 96%    Intake/Output Summary (Last 24 hours) at 11/04/15 1500 Last data filed at 11/04/15 0744  Gross per 24 hour  Intake    370 ml  Output    825 ml  Net   -455 ml    Telemetry reveals SR with 2nd degree AVB type1, rates 50's-60's, at times 2:1 conduction,  frequent PVC's, occ couplet  GEN- The patient is well appearing, alert and oriented x 3 today.   Head- normocephalic, atraumatic Eyes-  Sclera clear, conjunctiva pink Ears- hearing intact Oropharynx- clear Neck- supple, no JVP Lungs- Clear to ausculation bilaterally, normal work of breathing Heart- Regular rate and rhythm, extrasystoles appreciated, no significant murmurs, no rubs or gallops GI- soft, NT, ND Extremities- no clubbing, cyanosis, or edema left, RLE is wrapped/bangaged Skin- no rash or lesion Psych- euthymic mood, full affect Neuro- no gross deficits appreciated  CBC:  Recent Labs  11/02/15 0351 11/03/15 0453  WBC 8.8 8.9  HGB 7.5* 9.1*  HCT 22.6* 26.9*  MCV 93.4 92.4  PLT 148* 134*   Cardiac Enzymes:  Recent Labs  11/03/15 1210 11/03/15 1800 11/04/15 0058  TROPONINI 0.10* 0.10* 0.09*      ASSESSMENT AND PLAN:  1. Dizziness and CP with ambulation  The  patient walked today and did stairs and tells me he felt very well.  PT charted HR in 50's, with the walk  i don't see BP or O2 sat but the patient today asymptomatic without dizziness or CP     BP by graphics have been stable Tele reviewed He has SR with 2nd degree AVB type 1, frequent PVC's, and at about the time of PT note, with artifact difficult to see, though V rates 50's-60's. At about 2:00pm, noting 2:1 conduction with V rates 60's the patient and wide state he was likely doing stairs at that time and was asymptomatic He had 2:1 conduction overnight, known for him by prior event monitor to have had nocturnal 2:1 conduction  I will review with Dr. Caryl Comes, and await his input and evaluation  2. PAFlutter  S/p ablation 11/11/13  Records indicat PAFib as well, CHA2DS2Vasc is at least 2 on Eliquis at home, low dose here post-op  3. HTN  stable  Tommye Standard, PA-C 11/04/2015 3:00 PM

## 2015-11-04 NOTE — Progress Notes (Signed)
Patient ID: Robert Gardner, male   DOB: 1940/12/30, 75 y.o.   MRN: [id number]    PROGRESS NOTE    Zeik Loeb  [id number] DOB: Dec 05, 1940 DOA: 10/31/2015  PCP: Gara Kroner, MD   Brief Narrative:  75 y.o. male presented for an elective R knee surgery, he underwent RIGHT TOTAL KNEE ARTHROPLASTY on 10/31/15. He has PMHx of PAFlutter with ablation 11/12/15 by Dr. Lovena Le, on Eliquis at home, HTN, known hx of 2nd degree AVBlock type one, seizure d/o, and Bell's palsy. TRH consulted for bradycardia.   Assessment & Plan:   Symptomatic bradycardia - with dizziness and CP with ambulation and transient hypotension  - now all resolved after PRBC transfusion 5/10 - appreciate cardiology assistance, keep on tele for now     PAFlutter - S/p ablation 11/11/13 - CHA2DS2Vasc is at least 2 on Eliquis at home - continue here    Hypertension, essential  - reasonably stable for now    Chronic combined systolic and diastolic CHF/Mitral regurgitation/ Pulmonary hypertension - Not on ACE inhibitor secondary to chronic kidney disease - weight 162 lbs this AM, no signs of volume overload    Elevated troponin - possibly demands ischemia in the setting of post op blood loss    CKD III - Renal function at baseline: 21/1.58   Postoperative anemia/thrombocytopenia - s/p 2 units of packed red blood cells transfusion 5/10 - no signs of active bleeding - CBC in AM   Primary osteoarthritis of right knee status post right total knee arthroplasty - Per primary orthopedic team  DVT prophylaxis: On Eliquis  Code Status: Full  Family Communication: Patient and wife at bedside  Disposition Plan: Per primary team   Consultants:   Cardiology  TRH  Procedures:   Right total knee arthroplasty   Antimicrobials:   None  Subjective: Reports feeling better but has intermittent headaches.   Objective: Filed Vitals:   11/03/15 1431 11/03/15 1840 11/03/15 2153 11/04/15 0430  BP: 133/57  122/50 122/69 152/63  Pulse: 57 57 57 67  Temp: 99.2 F (37.3 C) 99.2 F (37.3 C) 99 F (37.2 C) 99.1 F (37.3 C)  TempSrc: Oral Oral Oral Oral  Resp: 17  16 18   Height:      Weight:      SpO2: 96% 100% 95% 95%    Intake/Output Summary (Last 24 hours) at 11/04/15 1051 Last data filed at 11/04/15 0744  Gross per 24 hour  Intake    610 ml  Output    825 ml  Net   -215 ml   Filed Weights   10/31/15 0836  Weight: 73.483 kg (162 lb)    Examination:  General exam: Appears calm and comfortable  Respiratory system: Clear to auscultation. Respiratory effort normal. Cardiovascular system: S1 & S2 heard, No JVD, SEM 3/6, no rubs, gallops or clicks. No pedal edema. Gastrointestinal system: Abdomen is nondistended, soft and nontender. No organomegaly or masses felt. Normal bowel sounds heard. Central nervous system: Alert and oriented. No focal neurological deficits.  Data Reviewed: I have personally reviewed following labs and imaging studies  CBC:  Recent Labs Lab 11/01/15 0618 11/02/15 0351 11/03/15 0453  WBC 6.4 8.8 8.9  HGB 8.7* 7.5* 9.1*  HCT 25.9* 22.6* 26.9*  MCV 95.6 93.4 92.4  PLT 178 148* [id number]*   Basic Metabolic Panel:  Recent Labs Lab 11/01/15 0618  NA 135  K 4.2  CL 102  CO2 22  GLUCOSE 132*  BUN 21*  CREATININE 1.58*  CALCIUM  8.8*   Coagulation Profile:  Recent Labs Lab 10/31/15 0835  INR 1.09   Cardiac Enzymes:  Recent Labs Lab 11/03/15 1210 11/03/15 1800 11/04/15 0058  TROPONINI 0.10* 0.10* 0.09*   Urine analysis:    Component Value Date/Time   COLORURINE YELLOW 10/21/2015 1341   APPEARANCEUR CLEAR 10/21/2015 1341   LABSPEC 1.006 10/21/2015 1341   PHURINE 6.0 10/21/2015 1341   GLUCOSEU NEGATIVE 10/21/2015 1341   HGBUR SMALL* 10/21/2015 1341   BILIRUBINUR NEGATIVE 10/21/2015 1341   KETONESUR NEGATIVE 10/21/2015 1341   PROTEINUR NEGATIVE 10/21/2015 1341   UROBILINOGEN 2.0* 10/30/2011 2054   NITRITE NEGATIVE 10/21/2015 1341    LEUKOCYTESUR NEGATIVE 10/21/2015 1341   Sepsis Labs: @LABRCNTIP (procalcitonin:4,lacticidven:4)  )No results found for this or any previous visit (from the past 240 hour(s)).    Radiology Studies: Dg Chest Port 1 View  11/03/2015  CLINICAL DATA:  Chest pain EXAM: PORTABLE CHEST 1 VIEW COMPARISON:  10/21/2015 FINDINGS: Chronic cardiomegaly and aortic tortuosity. Negative hila. There is no edema, consolidation, effusion, or pneumothorax. No osseous finding to explain acute chest pain. IMPRESSION: No acute finding.  Chronic cardiomegaly. Electronically Signed   By: Monte Fantasia M.D.   On: 11/03/2015 12:16      Scheduled Meds: . apixaban  2.5 mg Oral Q12H  . docusate sodium  100 mg Oral BID  . furosemide  20 mg Oral QODAY  . hydrALAZINE  10 mg Oral Q8H  . potassium chloride SA  20 mEq Oral QODAY   Continuous Infusions:    LOS: 4 days    Time spent: 20 minutes    Faye Ramsay, MD [facility] Pager [phone]  If 7PM-7AM, please contact night-coverage [email or web address] Password [facility] 11/04/2015, 10:51 AM

## 2015-11-04 NOTE — Progress Notes (Signed)
SUBJECTIVE:  Feeling good and no complaints  He walked with PT today and denied any chest pain, SOB, or dizziness.   OBJECTIVE:   Vitals:   Filed Vitals:   11/03/15 1840 11/03/15 2153 11/04/15 0430 11/04/15 1409  BP: 122/50 122/69 152/63 124/55  Pulse: 57 57 67 59  Temp: 99.2 F (37.3 C) 99 F (37.2 C) 99.1 F (37.3 C) 98.4 F (36.9 C)  TempSrc: Oral Oral Oral   Resp:  16 18 16   Height:      Weight:      SpO2: 100% 95% 95% 96%   I&O's:   Intake/Output Summary (Last 24 hours) at 11/04/15 1559 Last data filed at 11/04/15 0744  Gross per 24 hour  Intake    370 ml  Output    825 ml  Net   -455 ml   TELEMETRY: Reviewed telemetry pt in NSR with 2:1 AV block during sleep last night.  Today NSR with PVC's   PHYSICAL EXAM General: Well developed, well nourished, in no acute distress Head: Eyes PERRLA, No xanthomas.   Normal cephalic and atramatic  Lungs:   Clear bilaterally to auscultation and percussion. Heart:   HRRR S1 S2 Pulses are 2+ & equal. Abdomen: Bowel sounds are positive, abdomen soft and non-tender without massesExtremities:   No clubbing, cyanosis or edema.  DP +1 Neuro: Alert and oriented X 3. Psych:  Good affect, responds appropriately   LABS: Basic Metabolic Panel: No results for input(s): NA, K, CL, CO2, GLUCOSE, BUN, CREATININE, CALCIUM, MG, PHOS in the last 72 hours. Liver Function Tests: No results for input(s): AST, ALT, ALKPHOS, BILITOT, PROT, ALBUMIN in the last 72 hours. No results for input(s): LIPASE, AMYLASE in the last 72 hours. CBC:  Recent Labs  11/02/15 0351 11/03/15 0453  WBC 8.8 8.9  HGB 7.5* 9.1*  HCT 22.6* 26.9*  MCV 93.4 92.4  PLT 148* 134*   Cardiac Enzymes:  Recent Labs  11/03/15 1210 11/03/15 1800 11/04/15 0058  TROPONINI 0.10* 0.10* 0.09*   BNP: Invalid input(s): POCBNP D-Dimer: No results for input(s): DDIMER in the last 72 hours. Hemoglobin A1C: No results for input(s): HGBA1C in the last 72 hours. Fasting  Lipid Panel: No results for input(s): CHOL, HDL, LDLCALC, TRIG, CHOLHDL, LDLDIRECT in the last 72 hours. Thyroid Function Tests: No results for input(s): TSH, T4TOTAL, T3FREE, THYROIDAB in the last 72 hours.  Invalid input(s): FREET3 Anemia Panel: No results for input(s): VITAMINB12, FOLATE, FERRITIN, TIBC, IRON, RETICCTPCT in the last 72 hours. Coag Panel:   Lab Results  Component Value Date   INR 1.09 10/31/2015   INR 1.29 10/21/2015   INR 1.14 10/03/2015    RADIOLOGY: Dg Chest 2 View  10/21/2015  CLINICAL DATA:  Hypertension, preop for right total knee arthroplasty. EXAM: CHEST  2 VIEW COMPARISON:  April 11, 2014. FINDINGS: The heart size and mediastinal contours are within normal limits. Both lungs are clear. The visualized skeletal structures are unremarkable. IMPRESSION: No active cardiopulmonary disease. Electronically Signed   By: Marijo Conception, M.D.   On: 10/21/2015 13:58   Dg Chest Port 1 View  11/03/2015  CLINICAL DATA:  Chest pain EXAM: PORTABLE CHEST 1 VIEW COMPARISON:  10/21/2015 FINDINGS: Chronic cardiomegaly and aortic tortuosity. Negative hila. There is no edema, consolidation, effusion, or pneumothorax. No osseous finding to explain acute chest pain. IMPRESSION: No acute finding.  Chronic cardiomegaly. Electronically Signed   By: Monte Fantasia M.D.   On: 11/03/2015 12:16  ASSESSMENT AND PLAN  1. Bradycardia:  - Patient has been having significant bradycardia with heart rate in the 40s-60s, he also has been noticing dizziness with ambulation. - The question is really whether or not his symptom is related to bradycardia, he is not on any rate control medication. The fact that he has been having majority of his dizziness during ambulation is concerning for chronotropic incompetence given underlying Wenckebach second-degree AV block. Recent heart monitor obtained as outpatient also showed Mobitz type II heart block during sleep. -  It was originally recommended for the patient to undergo sleep study, wife denies any snoring nor has she noticed any apnea episode during sleep.  - He walked with PT today and did not have any oxygen desaturations or chest pain.  HR was in the 60's and dropped into the 50's transiently as documented by PT with intermittent type I second degree Wenkebach block.  - Await further recs by EP but suspect we will continue to follow for symptoms as an outpt.  2. Chest pressure with walking and mildly elevated trop - Although patient had 2 episodes of chest pressure during PT UL and training yesterday, his troponin is only mildly elevated at 0.1 in the setting of significant anemia and recent surgery. - Note recent cardiac cath in April 2017 showed no significant CAD. Would not pursue any further workup. This is c/w demand ischemia.   3. R total knee arthroplasty: Per orthopedic  4. atrial fibrillation on eliquis - CHA2DS2-Vasc score 3 (age, HTN, HF) - no obvious recurrence, current in NSR  5. aflutter s/p ablation  6. Hypertension - BP fairly well controlled  7. moderate MR  8. Chronic diastolic heart failure: He is 2.6 L positive since admission. Despite having elevated BNP, his lung clear, no obvious lower extremity edema, I do not think he is significantly fluid overloaded at this time.  He is on qOD lasix    Fransico Him, MD  11/04/2015  3:59 PM

## 2015-11-04 NOTE — Progress Notes (Signed)
PATIENT ID: Robert Gardner  MRN: [id number]  DOB/AGE:  1941/03/25 / 75 y.o.  4 Days Post-Op Procedure(s) (LRB): RIGHT TOTAL KNEE ARTHROPLASTY (Right)    PROGRESS NOTE Subjective: Patient is alert, oriented, no Nausea, no Vomiting, yes passing gas. Taking PO well. Denies SOB, Chest or Calf Pain. Has developed a head ache.  Using Incentive Spirometer, PAS in place. Ambulate WBAT with pt getting up to use restroom, CPM 0-40 Patient reports pain as 4/10 .    Objective: Vital signs in last 24 hours: Filed Vitals:   11/03/15 1431 11/03/15 1840 11/03/15 2153 11/04/15 0430  BP: 133/57 122/50 122/69 152/63  Pulse: 57 57 57 67  Temp: 99.2 F (37.3 C) 99.2 F (37.3 C) 99 F (37.2 C) 99.1 F (37.3 C)  TempSrc: Oral Oral Oral Oral  Resp: 17  16 18   Height:      Weight:      SpO2: 96% 100% 95% 95%      Intake/Output from previous day: I/O last 3 completed shifts: In: 51 [P.O.:970; Blood:665] Out: 450 [Urine:450]   Intake/Output this shift: Total I/O In: -  Out: 375 [Urine:375]   LABORATORY DATA:  Recent Labs  11/02/15 0351 11/03/15 0453  WBC 8.8 8.9  HGB 7.5* 9.1*  HCT 22.6* 26.9*  PLT 148* 134*    Examination: Neurologically intact Neurovascular intact Sensation intact distally Intact pulses distally Dorsiflexion/Plantar flexion intact Incision: dressing C/D/I No cellulitis present Compartment soft}  Assessment:   4 Days Post-Op Procedure(s) (LRB): RIGHT TOTAL KNEE ARTHROPLASTY (Right) ADDITIONAL DIAGNOSIS: Expected Acute Blood Loss Anemia, Hypertension and Cardiac Arrythmia Atrial fibrillation, history of systolic and diastolic heart failure, history of second-degree AV block,  Episodes of Bradycardia - currently on telemetry and followed by cardiology.     Plan: PT/OT WBAT, CPM 5/hrs day until ROM 0-90 degrees, then D/C CPM DVT Prophylaxis:  SCDx72hrs, Eliquis 2.5 mg BID x 2 weeks then back to normal dose of 5 mg BID thereafter. DISCHARGE PLAN: Home  Once he  is cleared by cardiology and meets his therapy goals. DISCHARGE NEEDS: HHPT, CPM, Walker and 3-in-1 comode seat     PHILLIPS, ERIC R 11/04/2015, 8:13 AM

## 2015-11-04 NOTE — Discharge Summary (Signed)
Patient ID: Robert Gardner MRN: [id number] DOB/AGE: 1940-11-20 75 y.o.  Admit date: 10/31/2015 Discharge date: 11/04/2015  Admission Diagnoses:  Principal Problem:   Symptomatic bradycardia Active Problems:   Hypertension   Atrial fibrillation (HCC)   Chronic combined systolic and diastolic CHF (congestive heart failure) (HCC)   Mitral regurgitation   Pulmonary hypertension (HCC)   Primary osteoarthritis of right knee   CKD (chronic kidney disease), stage III   Postoperative anemia   Thrombocytopenia (HCC)   Chest pain   Discharge Diagnoses:  Same  Past Medical History  Diagnosis Date  . Hypertension   . Combined systolic and diastolic heart failure ([place])     EF 45% by echo 08/2014.  Marland Kitchen Bell palsy   . Erectile dysfunction   . Mobitz (type) I (Wenckebach's) atrioventricular block     a. By Holter 10/2012.  . Mitral regurgitation     Moderate by echo 08/2014  . Atrial flutter ([place])     s/p flutter ablation  . CKD (chronic kidney disease), stage II   . Normocytic anemia   . Thyroid enlargement     a. Abnl xray 09/2013 - possible R thyroid enlargement.   . Second degree AV block, Mobitz type I 09/08/2015  . CHF (congestive heart failure) ([place])   . Shortness of breath dyspnea     with exertion   . Arthritis   . Cancer [facility])     skin cancer right leg-melanoma    Surgeries: Procedure(s): RIGHT TOTAL KNEE ARTHROPLASTY on 10/31/2015   Consultants: Treatment Team:  Rounding Lbcardiology, MD Minerva Ends, MD  Discharged Condition: Improved  Hospital Course: Robert Gardner is an 75 y.o. male who was admitted 10/31/2015 for operative treatment ofSymptomatic bradycardia. Patient has severe unremitting pain that affects sleep, daily activities, and work/hobbies. After pre-op clearance the patient was taken to the operating room on 10/31/2015 and underwent  Procedure(s): RIGHT TOTAL KNEE ARTHROPLASTY.    Patient was given perioperative antibiotics: Anti-infectives    Start      Dose/Rate Route Frequency Ordered Stop   10/31/15 1050  cefUROXime (ZINACEF) injection  Status:  Discontinued       As needed 10/31/15 1050 10/31/15 1141   10/31/15 0823  ceFAZolin (ANCEF) IVPB 2g/100 mL premix     2 g 200 mL/hr over 30 Minutes Intravenous On call to O.R. 10/31/15 [id number] 10/31/15 1002       Patient was given sequential compression devices, early ambulation, and chemoprophylaxis to prevent DVT.  Patient benefited maximally from hospital stay and there were no complications.    Recent vital signs: Patient Vitals for the past 24 hrs:  BP Temp Temp src Pulse Resp SpO2  11/04/15 1409 (!) 124/55 mmHg 98.4 F (36.9 C) - (!) 59 16 96 %  11/04/15 0430 (!) 152/63 mmHg 99.1 F (37.3 C) Oral 67 18 95 %  11/03/15 2153 122/69 mmHg 99 F (37.2 C) Oral (!) 57 16 95 %  11/03/15 1840 (!) 122/50 mmHg 99.2 F (37.3 C) Oral (!) 57 - 100 %     Recent laboratory studies:  Recent Labs  11/02/15 0351 11/03/15 0453  WBC 8.8 8.9  HGB 7.5* 9.1*  HCT 22.6* 26.9*  PLT 148* 134*     Discharge Medications:     Medication List    STOP taking these medications        acetaminophen 650 MG CR tablet  Commonly known as:  TYLENOL      TAKE these medications  amLODipine 5 MG tablet  Commonly known as:  NORVASC  TAKE ONE TABLET BY MOUTH ONCE DAILY     apixaban 2.5 MG Tabs tablet  Commonly known as:  ELIQUIS  Take 1 tablet (2.5 mg total) by mouth 2 (two) times daily.     furosemide 20 MG tablet  Commonly known as:  LASIX  Take 1 tablet (20 mg total) by mouth every other day.     multivitamin capsule  Take 1 capsule by mouth daily.     oxyCODONE-acetaminophen 5-325 MG tablet  Commonly known as:  ROXICET  Take 1 tablet by mouth every 4 (four) hours as needed.     potassium chloride SA 20 MEQ tablet  Commonly known as:  K-DUR,KLOR-CON  Take 1 tablet (20 mEq total) by mouth every other day. Take every other day with Lasix.     tiZANidine 2 MG tablet  Commonly known  as:  ZANAFLEX  Take 1 tablet (2 mg total) by mouth every 6 (six) hours as needed for muscle spasms.        Diagnostic Studies: Dg Chest 2 View  10/21/2015  CLINICAL DATA:  Hypertension, preop for right total knee arthroplasty. EXAM: CHEST  2 VIEW COMPARISON:  April 11, 2014. FINDINGS: The heart size and mediastinal contours are within normal limits. Both lungs are clear. The visualized skeletal structures are unremarkable. IMPRESSION: No active cardiopulmonary disease. Electronically Signed   By: Marijo Conception, M.D.   On: 10/21/2015 13:58   Dg Chest Port 1 View  11/03/2015  CLINICAL DATA:  Chest pain EXAM: PORTABLE CHEST 1 VIEW COMPARISON:  10/21/2015 FINDINGS: Chronic cardiomegaly and aortic tortuosity. Negative hila. There is no edema, consolidation, effusion, or pneumothorax. No osseous finding to explain acute chest pain. IMPRESSION: No acute finding.  Chronic cardiomegaly. Electronically Signed   By: Monte Fantasia M.D.   On: 11/03/2015 12:16    Disposition: 01-Home or Self Care      Discharge Instructions    CPM    Complete by:  As directed   Continuous passive motion machine (CPM):      Use the CPM from 0 to 60  for 5 hours per day.      You may increase by 10 degrees per day.  You may break it up into 2 or 3 sessions per day.      Use CPM for 2 weeks or until you are told to stop.     Call MD / Call 911    Complete by:  As directed   If you experience chest pain or shortness of breath, CALL 911 and be transported to the hospital emergency room.  If you develope a fever above 101 F, pus (white drainage) or increased drainage or redness at the wound, or calf pain, call your surgeon's office.     Constipation Prevention    Complete by:  As directed   Drink plenty of fluids.  Prune juice may be helpful.  You may use a stool softener, such as Colace (over the counter) 100 mg twice a day.  Use MiraLax (over the counter) for constipation as needed.     Diet - low sodium heart  healthy    Complete by:  As directed      Driving restrictions    Complete by:  As directed   No driving for 2 weeks     Increase activity slowly as tolerated    Complete by:  As directed  Patient may shower    Complete by:  As directed   You may shower without a dressing once there is no drainage.  Do not wash over the wound.  If drainage remains, cover wound with plastic wrap and then shower.           Follow-up Information    Follow up with Kerin Salen, MD In 2 weeks.   Specialty:  Orthopedic Surgery   Contact information:   [place] [address] [phone]       Follow up with [facility].   Specialty:  Home Health Services   Why:  They will contact you to schedule home therapy visits.   Contact information:   [place] [address] [phone]        Signed: Hardin Negus, [place] Gamero R 11/04/2015, 4:27 PM

## 2015-11-04 NOTE — Progress Notes (Signed)
Physical Therapy Treatment Patient Details Name: Robert Gardner MRN: [id number] DOB: [birth year] Today's Date: 11/04/2015    History of Present Illness Patient is a 75 y/o male admitted for R TKA PMH positive for HTN, a-fib, Second degree AV block, Mobitz type I, CHF, Bell's palsy, seizure.    PT Comments    Pt seen for session with a focus on ambulation. Pt able to ambulate 40 feet with rw and remained asymptomatic. Pulse did decrease into the 50s but pt without complaints. Will continue to follow and attempt stairs at next session.   Follow Up Recommendations  Home health PT;Supervision for mobility/OOB     Equipment Recommendations  Rolling walker with 5" wheels;3in1 (PT)    Recommendations for Other Services       Precautions / Restrictions Precautions Precautions: Fall;Knee Precaution Comments: bradycardia Restrictions Weight Bearing Restrictions: Yes RLE Weight Bearing: Weight bearing as tolerated    Mobility  Bed Mobility               General bed mobility comments: up in chair upon arrival  Transfers Overall transfer level: Needs assistance Equipment used: Rolling walker (2 wheeled) Transfers: Sit to/from Stand Sit to Stand: Min guard         General transfer comment: stable with balance upon standing with rw  Ambulation/Gait Ambulation/Gait assistance: Min guard Ambulation Distance (Feet): 40 Feet Assistive device: Rolling walker (2 wheeled) Gait Pattern/deviations: Step-to pattern;Decreased weight shift to right Gait velocity: decreased   General Gait Details: encouraging step through pattern   Stairs            Wheelchair Mobility    Modified Rankin (Stroke Patients Only)       Balance Overall balance assessment: Needs assistance Sitting-balance support: No upper extremity supported Sitting balance-Leahy Scale: Good     Standing balance support: Bilateral upper extremity supported Standing balance-Leahy Scale: Poor Standing  balance comment: using rw for support                    Cognition Arousal/Alertness: Awake/alert Behavior During Therapy: WFL for tasks assessed/performed Overall Cognitive Status: Within Functional Limits for tasks assessed                      Exercises      General Comments General comments (skin integrity, edema, etc.): SpO2 98% prior to ambulation, pulse 68-72 bpm, during ambulation pulse decreasing to 54 bpm (asymptomatic).       Pertinent Vitals/Pain Pain Assessment: Faces Faces Pain Scale: Hurts a little bit Pain Location: Rt knee Pain Descriptors / Indicators: Sore Pain Intervention(s): Limited activity within patient's tolerance;Monitored during session    Home Living                      Prior Function            PT Goals (current goals can now be found in the care plan section) Acute Rehab PT Goals Patient Stated Goal: Feel better PT Goal Formulation: With patient/family Time For Goal Achievement: 11/04/15 Potential to Achieve Goals: Good Progress towards PT goals: Progressing toward goals    Frequency  7X/week    PT Plan Current plan remains appropriate    Co-evaluation             End of Session Equipment Utilized During Treatment: Gait belt Activity Tolerance: Patient tolerated treatment well Patient left: in chair;with call bell/phone within reach;with family/visitor present     Time: [id number]  PT Time Calculation (min) (ACUTE ONLY): 20 min  Charges:  $Gait Training: 8-22 mins                    G Codes:      Cassell Clement, PT, CSCS Pager [phone] Office [phone]]  11/04/2015, 1:21 PM

## 2015-11-04 NOTE — Progress Notes (Signed)
Physical Therapy Treatment Patient Details Name: Robert Gardner MRN: [id number] DOB: June 18, 1941 Today's Date: 11/04/2015    History of Present Illness Patient is a 75 y/o male admitted for R TKA PMH positive for HTN, a-fib, Second degree AV block, Mobitz type I, CHF, Bell's palsy, seizure.    PT Comments    Patient is making good progress with PT.  From a mobility standpoint anticipate patient will be ready for DC home with family support.      Follow Up Recommendations  Home health PT;Supervision for mobility/OOB     Equipment Recommendations  Rolling walker with 5" wheels;3in1 (PT)    Recommendations for Other Services       Precautions / Restrictions Precautions Precautions: Fall;Knee Precaution Booklet Issued: Yes (comment) Precaution Comments: bradycardia; reviewed not placing ice pack, pillow or other object under knee Restrictions Weight Bearing Restrictions: Yes RLE Weight Bearing: Weight bearing as tolerated    Mobility  Bed Mobility               General bed mobility comments: Pt up on chair on OT arrival  Transfers Overall transfer level: Needs assistance Equipment used: Rolling walker (2 wheeled) Transfers: Sit to/from Stand Sit to Stand: Min guard         General transfer comment: transfers performed from bed and toilet height  Ambulation/Gait Ambulation/Gait assistance: Min guard Ambulation Distance (Feet): 30 Feet Assistive device: Rolling walker (2 wheeled) Gait Pattern/deviations: Step-to pattern Gait velocity: decreased   General Gait Details: encouraging step through pattern   Stairs Stairs: Yes Stairs assistance: Min guard Stair Management: One rail Right;Sideways;Step to pattern Number of Stairs: 4 General stair comments: encouraging knee extension for stability. Pt and spouse verbalize feeling confident with going stairs athome.   Wheelchair Mobility    Modified Rankin (Stroke Patients Only)       Balance Overall  balance assessment: Needs assistance Sitting-balance support: No upper extremity supported Sitting balance-Leahy Scale: Good     Standing balance support: Bilateral upper extremity supported Standing balance-Leahy Scale: Fair Standing balance comment: Able to maintain static standing balance to complete dressing tasks                    Cognition Arousal/Alertness: Awake/alert Behavior During Therapy: WFL for tasks assessed/performed Overall Cognitive Status: Within Functional Limits for tasks assessed                      Exercises      General Comments General comments (skin integrity, edema, etc.): SpO2 98% prior to ambulation, pulse 68-72 bpm, during ambulation pulse decreasing to 54 bpm (asymptomatic).       Pertinent Vitals/Pain Pain Assessment: 0-10 Pain Score: 2  Faces Pain Scale: Hurts a little bit Pain Location: rt knee Pain Descriptors / Indicators: Sore Pain Intervention(s): Limited activity within patient's tolerance;Monitored during session    Home Living                      Prior Function            PT Goals (current goals can now be found in the care plan section) Acute Rehab PT Goals Patient Stated Goal: go home PT Goal Formulation: With patient/family Time For Goal Achievement: 11/04/15 Potential to Achieve Goals: Good Progress towards PT goals: Progressing toward goals    Frequency  7X/week    PT Plan Current plan remains appropriate    Co-evaluation  End of Session Equipment Utilized During Treatment: Gait belt Activity Tolerance: Patient tolerated treatment well Patient left: in chair;with call bell/phone within reach;with family/visitor present     Time: [id number] PT Time Calculation (min) (ACUTE ONLY): 23 min  Charges:  $Gait Training: 23-37 mins                    G Codes:      Cassell Clement, PT, CSCS Pager [phone] Office [phone]  11/04/2015, 4:25 PM

## 2015-11-04 NOTE — Progress Notes (Signed)
Patient discharged home with family. All patients belongs were taken from room. Discharge instructions and prescriptions given.

## 2015-11-07 ENCOUNTER — Telehealth: Payer: Self-pay

## 2015-11-07 DIAGNOSIS — G479 Sleep disorder, unspecified: Secondary | ICD-10-CM

## 2015-11-07 DIAGNOSIS — I441 Atrioventricular block, second degree: Secondary | ICD-10-CM

## 2015-11-07 NOTE — Telephone Encounter (Signed)
-----   Message from Sueanne Margarita, MD sent at 10/30/2015  6:13 PM EDT ----- Heart monitor showed NSR with PACs and PVCs, and type I and II second degree AV block during sleep.  Please get sleep study to rule out sleep apnea

## 2015-11-07 NOTE — Telephone Encounter (Signed)
Informed patient of results and verbal understanding expressed.  Sleep study ordered for scheduling. Patient agrees with treatment plan.

## 2015-11-18 ENCOUNTER — Ambulatory Visit (INDEPENDENT_AMBULATORY_CARE_PROVIDER_SITE_OTHER): Payer: 59 | Admitting: Internal Medicine

## 2015-11-18 DIAGNOSIS — I272 Other secondary pulmonary hypertension: Secondary | ICD-10-CM | POA: Diagnosis not present

## 2015-11-18 LAB — PULMONARY FUNCTION TEST
DL/VA % PRED: 75 %
DL/VA: 3.45 ml/min/mmHg/L
DLCO cor % pred: 41 %
DLCO cor: 13.42 ml/min/mmHg
DLCO unc % pred: 36 %
DLCO unc: 11.93 ml/min/mmHg
FEF 25-75 POST: 1.98 L/s
FEF 25-75 PRE: 1.89 L/s
FEF2575-%Change-Post: 4 %
FEF2575-%PRED-POST: 84 %
FEF2575-%PRED-PRE: 80 %
FEV1-%CHANGE-POST: 4 %
FEV1-%Pred-Post: 67 %
FEV1-%Pred-Pre: 64 %
FEV1-POST: 1.9 L
FEV1-Pre: 1.82 L
FEV1FVC-%Change-Post: 6 %
FEV1FVC-%PRED-PRE: 107 %
FEV6-%CHANGE-POST: -2 %
FEV6-%PRED-POST: 60 %
FEV6-%Pred-Pre: 61 %
FEV6-Post: 2.18 L
FEV6-Pre: 2.23 L
FEV6FVC-%CHANGE-POST: 0 %
FEV6FVC-%Pred-Post: 105 %
FEV6FVC-%Pred-Pre: 105 %
FVC-%CHANGE-POST: -2 %
FVC-%PRED-POST: 57 %
FVC-%Pred-Pre: 58 %
FVC-Post: 2.18 L
FVC-Pre: 2.23 L
POST FEV1/FVC RATIO: 87 %
PRE FEV1/FVC RATIO: 82 %
PRE FEV6/FVC RATIO: 100 %
Post FEV6/FVC ratio: 100 %

## 2015-11-18 NOTE — Progress Notes (Signed)
PFT done today. 

## 2015-11-24 ENCOUNTER — Telehealth: Payer: Self-pay

## 2015-11-24 DIAGNOSIS — R942 Abnormal results of pulmonary function studies: Secondary | ICD-10-CM

## 2015-11-24 NOTE — Telephone Encounter (Signed)
-----   Message from Sueanne Margarita, MD sent at 11/20/2015 10:42 PM EDT ----- PFTs very abnormal and worrisome for interstitial pulmonary fibrosis - please set up appointment with Dr. Chase Caller or Halford Chessman

## 2015-11-24 NOTE — Telephone Encounter (Signed)
Informed patient's DPR of results and verbal understanding expressed.   Pulmonary referral placed. DPR was grateful for call.

## 2015-11-25 ENCOUNTER — Telehealth: Payer: Self-pay | Admitting: *Deleted

## 2015-11-25 NOTE — Telephone Encounter (Signed)
Prior Authorization Started with [employer].  Case Number [id number]

## 2016-01-11 ENCOUNTER — Encounter: Payer: Self-pay | Admitting: Internal Medicine

## 2016-01-11 ENCOUNTER — Other Ambulatory Visit (INDEPENDENT_AMBULATORY_CARE_PROVIDER_SITE_OTHER): Payer: 59

## 2016-01-11 ENCOUNTER — Ambulatory Visit (INDEPENDENT_AMBULATORY_CARE_PROVIDER_SITE_OTHER): Payer: 59 | Admitting: Internal Medicine

## 2016-01-11 VITALS — BP 112/64 | HR 65 | Ht 71.0 in | Wt 158.4 lb

## 2016-01-11 DIAGNOSIS — D62 Acute posthemorrhagic anemia: Secondary | ICD-10-CM | POA: Diagnosis not present

## 2016-01-11 DIAGNOSIS — R0689 Other abnormalities of breathing: Secondary | ICD-10-CM

## 2016-01-11 DIAGNOSIS — R0600 Dyspnea, unspecified: Secondary | ICD-10-CM | POA: Diagnosis not present

## 2016-01-11 DIAGNOSIS — R942 Abnormal results of pulmonary function studies: Secondary | ICD-10-CM

## 2016-01-11 DIAGNOSIS — Z87891 Personal history of nicotine dependence: Secondary | ICD-10-CM | POA: Diagnosis not present

## 2016-01-11 LAB — CBC WITH DIFFERENTIAL/PLATELET
BASOS ABS: 0 10*3/uL (ref 0.0–0.1)
Basophils Relative: 0.4 % (ref 0.0–3.0)
EOS ABS: 0.1 10*3/uL (ref 0.0–0.7)
Eosinophils Relative: 1.3 % (ref 0.0–5.0)
HCT: 30.8 % — ABNORMAL LOW (ref 39.0–52.0)
Hemoglobin: 10.3 g/dL — ABNORMAL LOW (ref 13.0–17.0)
LYMPHS ABS: 1.6 10*3/uL (ref 0.7–4.0)
Lymphocytes Relative: 26.6 % (ref 12.0–46.0)
MCHC: 33.3 g/dL (ref 30.0–36.0)
MCV: 90.6 fl (ref 78.0–100.0)
Monocytes Absolute: 0.6 10*3/uL (ref 0.1–1.0)
Monocytes Relative: 10 % (ref 3.0–12.0)
NEUTROS ABS: 3.7 10*3/uL (ref 1.4–7.7)
NEUTROS PCT: 61.7 % (ref 43.0–77.0)
PLATELETS: 241 10*3/uL (ref 150.0–400.0)
RBC: 3.4 Mil/uL — ABNORMAL LOW (ref 4.22–5.81)
RDW: 16.2 % — ABNORMAL HIGH (ref 11.5–15.5)
WBC: 6 10*3/uL (ref 4.0–10.5)

## 2016-01-11 NOTE — Patient Instructions (Addendum)
ICD-9-CM ICD-10-CM   1. Dyspnea and respiratory abnormality 786.09 R06.00     R06.89   2. Acute posthemorrhagic anemia 285.1 D62   3. Abnormal PFT 794.2 R94.2   4. History of smoking 25-50 pack years V15.82 Z87.891    Start empiric spiriva respimat for emphysema seen on CT April 2017 Do walk test room air Do ono test on room air Do CBC for anemia Do HRCT chest ILD protocol - to get better definition   - High Resolution CT chest without contrast on ILD protocol. DO both Supine and Prone Images. Only  Dr Lorin Picket or Dr Salvatore Marvel Dr. Vinnie Langton to read.    Followup  - next week few days before 01/24/16; CAN SEE ME OR AN APP

## 2016-01-11 NOTE — Progress Notes (Signed)
Subjective:    Patient ID: Robert Gardner, male    DOB: 1940-07-10, 75 y.o.   MRN: [id number]  PCP Gara Kroner, MD   HPI  IOV 01/11/2016  Chief Complaint  Patient presents with  . Advice Only    referred by Dr. Radford Pax for abn PFT.  pt c/o worsening SOB, chest pain with exertion X3-4 mos.     75 year old Afro-American male referred for dyspnea by Dr. Radford Pax. He's here with his wife. Reports insidious onset of dyspnea in the last 6 months. He has a remote 42 pack smoking history. This then triggered evaluation which is all documented below in chronological order. Dyspnea is moderate in intensity. It is progressive. It is improved mildly after his knee arthroplasty in May 2017. He did have some postoperative blood loss and was anemic postoperatively in May 2017. He has not had follow-up hemoglobin. Dyspnea is present with exertion relieved by rest. There is associated chest tightness. There is no associated wheeze or cough or chest pain or diaphoresis or syncope per se although occasionally can feel mildly dizzy. 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    Medication history review: He is not on amiodarone   09/29/2015: CT angiogram chest: Personally visualized and the findings below IMPRESSION: 1. No evidence for acute or chronic pulmonary embolus. 2. Left upper lobe ground-glass nodule is identified measuring 1.6 cm. Initial follow-up by chest CT without contrast is recommended in 3 months to confirm persistence. This recommendation follows the consensus statement: Recommendations for the Management of Subsolid Pulmonary Nodules Detected at CT: A Statement from the [facility] as published in Radiology 2013; 266:304-317. 3. Emphysema.   Electronically Signed  By: Kerby Moors M.D.  On: 09/29/2015 15:18     10/03/2015: Cardiology evaluation showed: atrial fibrillation, diastolic congestive heart failure,  moderate mitral regurgitation, hypertension and atrial flutter. Echo 12/21/2013 showed mild LV dysfunction with EF 45% with HK in the mid to basal inferior/inferoseptal and lateral walls, mild to mod MR, moderately dilated RV with mild to mod reduced RVF and mild pulmonary HTN PASP 61mmHg. He is on Eliquis for atrial flutter. He was seen by Dr. Lovena Le and underwent aflutter ablation.Outpt nuclear stress test 11/2013 with no ischemia.  09/23/2015: 2-D echocardiogram: Ejection fraction 45% left ventricle. Pulmonary artery pressure systolic elevated at 58 limited mercury   10/05/2015: Left heart angiogram shows mid right coronary artery with 10% stenosis. Left ventricular end-diastolic pressure at 26 with left ventricular ejection fraction 45%. Nonischemic cardiomyopathy diagnosed  11/01/2015: Admitted for symptomatic bradycardia and underwent right total knee arthroplasty 10/31/2015.Marland Kitchen Discharge mental 2017.   11/03/2015: Blood work shows anemia with hemoglobin 9.1 g percent. Troponins on the same day are normal  11/18/15:  Full pulmonary function test 11/18/2015 shows FVC 2.23 L/58%. FEV1 1.82 L/64% with a ratio of 82. There is no broncho-dilator response. DLCO is 13.4/41% and significantly reduced. Overall restriction with low DLCO.  June 2017: Dr. Radford Pax reviewed his pulm function test and is concerned about interstitial lung disease based restriction and DLCO and therefore has been referred here.    has a past medical history of Hypertension; Combined systolic and diastolic heart failure ([place]); Bell palsy; Erectile dysfunction; Mobitz (type) I (Wenckebach's) atrioventricular block; Mitral regurgitation; Atrial flutter ([place]); CKD (chronic kidney disease), stage II; Normocytic anemia; Thyroid enlargement; Second degree AV block, Mobitz type I (09/08/2015); CHF (congestive heart failure) ([place]); Shortness of breath dyspnea; Arthritis; and Cancer (  [place]).   reports that he quit smoking about 22 years ago.  His smoking use included Cigarettes. He has a 42 pack-year smoking history. He has never used smokeless tobacco.  Past Surgical History  Procedure Laterality Date  . Resection of melanoma Right     Leg  . Ablation  11-11-2013    CTI ablation by Dr Lovena Le  . Atrial flutter ablation N/A 11/11/2013    Procedure: ATRIAL FLUTTER ABLATION;  Surgeon: Evans Lance, MD;  Location: [facility] CATH LAB;  Service: Cardiovascular;  Laterality: N/A;  . Cardiac catheterization N/A 10/05/2015    Procedure: Left Heart Cath and Coronary Angiography;  Surgeon: Troy Sine, MD;  Location: [facility] CV LAB;  Service: Cardiovascular;  Laterality: N/A;  . Rectal tear      repair  . Cyst removal trunk      like a cyst  . Total knee arthroplasty Right 10/31/2015    Procedure: RIGHT TOTAL KNEE ARTHROPLASTY;  Surgeon: Frederik Pear, MD;  Location: [facility];  Service: Orthopedics;  Laterality: Right;    No Known Allergies  Immunization History  Administered Date(s) Administered  . Pneumococcal Polysaccharide-23 01/11/2015    Family History  Problem Relation Age of Onset  . Heart disease Mother     unclear details  . Heart disease Father     unclear details  . Heart disease Sister     unclear details     Current outpatient prescriptions:  .  amLODipine (NORVASC) 5 MG tablet, TAKE ONE TABLET BY MOUTH ONCE DAILY, Disp: 180 tablet, Rfl: 3 .  apixaban (ELIQUIS) 2.5 MG TABS tablet, Take 1 tablet (2.5 mg total) by mouth 2 (two) times daily., Disp: 28 tablet, Rfl: 0 .  furosemide (LASIX) 20 MG tablet, Take 1 tablet (20 mg total) by mouth every other day., Disp: 15 tablet, Rfl: 11 .  Multiple Vitamin (MULTIVITAMIN) capsule, Take 1 capsule by mouth daily., Disp: , Rfl:  .  oxyCODONE-acetaminophen (ROXICET) 5-325 MG tablet, Take 1 tablet by mouth every 4 (four) hours as needed., Disp: 60 tablet, Rfl: 0 .  potassium chloride SA (K-DUR,KLOR-CON) 20 MEQ tablet, Take 1 tablet (20 mEq total) by mouth every other day. Take  every other day with Lasix., Disp: 15 tablet, Rfl: 11 .  tiZANidine (ZANAFLEX) 2 MG tablet, Take 1 tablet (2 mg total) by mouth every 6 (six) hours as needed for muscle spasms., Disp: 60 tablet, Rfl: 0     Review of Systems  Constitutional: Negative for fever and unexpected weight change.  HENT: Negative for congestion, dental problem, ear pain, nosebleeds, postnasal drip, rhinorrhea, sinus pressure, sneezing, sore throat and trouble swallowing.   Eyes: Negative for redness and itching.  Respiratory: Positive for shortness of breath. Negative for cough, chest tightness and wheezing.   Cardiovascular: Positive for chest pain. Negative for palpitations and leg swelling.  Gastrointestinal: Negative for nausea and vomiting.  Genitourinary: Negative for dysuria.  Musculoskeletal: Negative for joint swelling.  Skin: Negative for rash.  Neurological: Negative for headaches.  Hematological: Does not bruise/bleed easily.  Psychiatric/Behavioral: Negative for dysphoric mood. The patient is not nervous/anxious.        Objective:   Physical Exam  Constitutional: He is oriented to person, place, and time. He appears well-developed and well-nourished. No distress.  HENT:  Head: Normocephalic and atraumatic.  Right Ear: External ear normal.  Left Ear: External ear normal.  Mouth/Throat: Oropharynx is clear and moist. No oropharyngeal exudate.  Eyes: Conjunctivae and EOM are normal.  Pupils are equal, round, and reactive to light. Right eye exhibits no discharge. Left eye exhibits no discharge. No scleral icterus.  Neck: Normal range of motion. Neck supple. No JVD present. No tracheal deviation present. No thyromegaly present.  Cardiovascular: Normal rate, regular rhythm and intact distal pulses.  Exam reveals no gallop and no friction rub.   No murmur heard. Pulmonary/Chest: Effort normal and breath sounds normal. No respiratory distress. He has no wheezes. He has no rales. He exhibits no  tenderness.  ? Crackles at base  Abdominal: Soft. Bowel sounds are normal. He exhibits no distension and no mass. There is no tenderness. There is no rebound and no guarding.  Musculoskeletal: Normal range of motion. He exhibits edema. He exhibits no tenderness.  Rt knee scar + edema right side distal to scar  Lymphadenopathy:    He has no cervical adenopathy.  Neurological: He is alert and oriented to person, place, and time. He has normal reflexes. No cranial nerve deficit. Coordination normal.  Skin: Skin is warm and dry. No rash noted. He is not diaphoretic. No erythema. No pallor.  Psychiatric: He has a normal mood and affect. His behavior is normal. Judgment and thought content normal.  Nursing note and vitals reviewed.   Filed Vitals:   01/11/16 1122  BP: 112/64  Pulse: 65  Height: 5\' 11"  (1.803 m)  Weight: 158 lb 6.4 oz (71.85 kg)  SpO2: 100%   Estimated body mass index is 22.1 kg/(m^2) as calculated from the following:   Height as of this encounter: 5\' 11"  (1.803 m).   Weight as of this encounter: 158 lb 6.4 oz (71.85 kg).       Assessment & Plan:     ICD-9-CM ICD-10-CM   1. Dyspnea and respiratory abnormality 786.09 R06.00 Pulse oximetry, overnight    R06.89 CBC w/Diff  2. Acute posthemorrhagic anemia 285.1 D62   3. Abnormal PFT 794.2 R94.2 CT Chest High Resolution  4. History of smoking 25-50 pack years V15.82 Z87.891   He has anemia. He has restrictive PFT with low DLCO. He has prior history of smoking. Which is significant. He has chronic nonischemic cardiopathy with chronic systolic heart failure. He also has elevated pulmonary artery pressures on echocardiogram April 2017. He also is deconditioned because of right knee issues. All of this can be contributing to his dyspnea. Central question will be allowed ruling out ILD. CT angiogram chest April 2017 ruled out pulmonary embolism and showed emphysema. This no description of ILD but given the fact contrast was used  we cannot be certain there is ILD or not. Therefore he will need a high-resolution CT chest.  In addition we'll evaluate for severity with overnight oxygen test and exertional pulse ox oximetry test.  In addition we'll see if that his anemia currently are not checking CBC  He'll finish all this workup in the next week and return to see me well before 01/24/16 so we can make a determination were going back to work  He and wife agreement with the plan   Dr. Brand Males, M.D., [facility].C.P Pulmonary and Critical Care Medicine Staff Physician [facility] Pager: [phone], If no answer or between  15:00h - 7:00h: call [phone]  01/11/2016 11:57 AM

## 2016-01-16 ENCOUNTER — Ambulatory Visit (INDEPENDENT_AMBULATORY_CARE_PROVIDER_SITE_OTHER)
Admission: RE | Admit: 2016-01-16 | Discharge: 2016-01-16 | Disposition: A | Discharge status: Home / Self Care | Payer: 59 | Source: Ambulatory Visit | Attending: Internal Medicine | Admitting: Internal Medicine

## 2016-01-16 DIAGNOSIS — R942 Abnormal results of pulmonary function studies: Secondary | ICD-10-CM | POA: Diagnosis not present

## 2016-01-17 ENCOUNTER — Encounter: Payer: Self-pay | Admitting: Internal Medicine

## 2016-01-19 ENCOUNTER — Ambulatory Visit (INDEPENDENT_AMBULATORY_CARE_PROVIDER_SITE_OTHER): Payer: 59 | Admitting: Internal Medicine

## 2016-01-19 ENCOUNTER — Encounter: Payer: Self-pay | Admitting: Internal Medicine

## 2016-01-19 VITALS — BP 126/66 | HR 58 | Ht 71.0 in | Wt 158.0 lb

## 2016-01-19 DIAGNOSIS — R0689 Other abnormalities of breathing: Secondary | ICD-10-CM

## 2016-01-19 DIAGNOSIS — I272 Other secondary pulmonary hypertension: Secondary | ICD-10-CM

## 2016-01-19 DIAGNOSIS — J431 Panlobular emphysema: Secondary | ICD-10-CM | POA: Diagnosis not present

## 2016-01-19 DIAGNOSIS — I5022 Chronic systolic (congestive) heart failure: Secondary | ICD-10-CM

## 2016-01-19 DIAGNOSIS — D62 Acute posthemorrhagic anemia: Secondary | ICD-10-CM | POA: Diagnosis not present

## 2016-01-19 DIAGNOSIS — R5381 Other malaise: Secondary | ICD-10-CM | POA: Diagnosis not present

## 2016-01-19 DIAGNOSIS — R0600 Dyspnea, unspecified: Secondary | ICD-10-CM | POA: Diagnosis not present

## 2016-01-19 MED ORDER — UMECLIDINIUM BROMIDE 62.5 MCG/INH IN AEPB: 1.0000 | INHALATION_SPRAY | Freq: Every day | RESPIRATORY_TRACT | 5 refills | Status: DC

## 2016-01-19 MED ORDER — UMECLIDINIUM BROMIDE 62.5 MCG/INH IN AEPB: 1.0000 | INHALATION_SPRAY | Freq: Every day | RESPIRATORY_TRACT | Status: DC

## 2016-01-19 NOTE — Addendum Note (Signed)
Addended by: Len Blalock on: 01/19/2016 10:44 AM   Modules accepted: Orders

## 2016-01-19 NOTE — Progress Notes (Signed)
Subjective:     Patient ID: Robert Gardner, male   DOB: 01-Feb-1941, 75 y.o.   MRN: [id number]  HPI   PCP Gara Kroner, MD   HPI  IOV 01/11/2016  Chief Complaint  Patient presents with  . Advice Only    referred by Dr. Radford Pax for abn PFT.  pt c/o worsening SOB, chest pain with exertion X3-4 mos.     75 year old Afro-American male referred for dyspnea by Dr. Radford Pax. He's here with his wife. Reports insidious onset of dyspnea in the last 6 months. He has a remote 42 pack smoking history. This then triggered evaluation which is all documented below in chronological order. Dyspnea is moderate in intensity. It is progressive. It is improved mildly after his knee arthroplasty in May 2017. He did have some postoperative blood loss and was anemic postoperatively in May 2017. He has not had follow-up hemoglobin. Dyspnea is present with exertion relieved by rest. There is associated chest tightness. There is no associated wheeze or cough or chest pain or diaphoresis or syncope per se although occasionally can feel mildly dizzy. 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    Medication history review: He is not on amiodarone   09/29/2015: CT angiogram chest: Personally visualized and the findings below IMPRESSION: 1. No evidence for acute or chronic pulmonary embolus. 2. Left upper lobe ground-glass nodule is identified measuring 1.6 cm. Initial follow-up by chest CT without contrast is recommended in 3 months to confirm persistence. This recommendation follows the consensus statement: Recommendations for the Management of Subsolid Pulmonary Nodules Detected at CT: A Statement from the [place] as published in Radiology 2013; 266:304-317. 3. Emphysema.   Electronically Signed  By: Kerby Moors M.D.  On: 09/29/2015 15:18     10/03/2015: Cardiology evaluation showed: atrial fibrillation, diastolic congestive heart  failure, moderate mitral regurgitation, hypertension and atrial flutter. Echo 12/21/2013 showed mild LV dysfunction with EF 45% with HK in the mid to basal inferior/inferoseptal and lateral walls, mild to mod MR, moderately dilated RV with mild to mod reduced RVF and mild pulmonary HTN PASP 98mmHg. He is on Eliquis for atrial flutter. He was seen by Dr. Lovena Le and underwent aflutter ablation.Outpt nuclear stress test 11/2013 with no ischemia.  09/23/2015: 2-D echocardiogram: Ejection fraction 45% left ventricle. Pulmonary artery pressure systolic elevated at 58 limited mercury   10/05/2015: Left heart angiogram shows mid right coronary artery with 10% stenosis. Left ventricular end-diastolic pressure at 26 with left ventricular ejection fraction 45%. Nonischemic cardiomyopathy diagnosed  11/01/2015: Admitted for symptomatic bradycardia and underwent right total knee arthroplasty 10/31/2015.Marland Kitchen Discharge mental 2017.   11/03/2015: Blood work shows anemia with hemoglobin 9.1 g percent. Troponins on the same day are normal  11/18/15:  Full pulmonary function test 11/18/2015 shows FVC 2.23 L/58%. FEV1 1.82 L/64% with a ratio of 82. There is no broncho-dilator response. DLCO is 13.4/41% and significantly reduced. Overall restriction with low DLCO.  June 2017: Dr. Radford Pax reviewed his pulm function test and is concerned about interstitial lung disease based restriction and DLCO and therefore has been referred here.     OV 01/19/2016  Chief Complaint  Patient presents with  . Follow-up    review PFT, HRCT.  pt's breathing is unchanged.       Follow-up dyspnea with lab review at this visit. Presents with his wife. Following results were reviewed.  01/11/2016: Walking desaturation test: [name] from 99% to 94%.Marland Kitchen Stop  at the end of 2 laps due to lightheadedness. This was on room air. He did not complete his third lap.  01/11/2016: CBC shows hemoglobin of 10.3 g percent and this is improved since  postoperative blood loss in May 2017.  01/13/2016: High-resolution CT chest personally visualized negative findings. He has bilateral apical emphysema associated with air trapping. He also has evidence of pulmonary hypertension. There is no fibrotic lung disease or nodules. Previously on glass opacities have resolved.  01/19/2016: Overnight desaturation test done but results are pending.   has a past medical history of Arthritis; Atrial flutter ([place]); Bell palsy; Cancer [facility]); CHF (congestive heart failure) ([place]); CKD (chronic kidney disease), stage II; Combined systolic and diastolic heart failure ([place]); Erectile dysfunction; Hypertension; Mitral regurgitation; Mobitz (type) I (Wenckebach's) atrioventricular block; Normocytic anemia; Second degree AV block, Mobitz type I (09/08/2015); Shortness of breath dyspnea; and Thyroid enlargement.   reports that he quit smoking about 22 years ago. His smoking use included Cigarettes. He has a 42.00 pack-year smoking history. He has never used smokeless tobacco.  Past Surgical History:  Procedure Laterality Date  . ABLATION  11-11-2013   CTI ablation by Dr Lovena Le  . ATRIAL FLUTTER ABLATION N/A 11/11/2013   Procedure: ATRIAL FLUTTER ABLATION;  Surgeon: Evans Lance, MD;  Location: [facility] CATH LAB;  Service: Cardiovascular;  Laterality: N/A;  . CARDIAC CATHETERIZATION N/A 10/05/2015   Procedure: Left Heart Cath and Coronary Angiography;  Surgeon: Troy Sine, MD;  Location: [facility] CV LAB;  Service: Cardiovascular;  Laterality: N/A;  . CYST REMOVAL TRUNK     like a cyst  . Rectal tear     repair  . Resection of melanoma Right    Leg  . TOTAL KNEE ARTHROPLASTY Right 10/31/2015   Procedure: RIGHT TOTAL KNEE ARTHROPLASTY;  Surgeon: Frederik Pear, MD;  Location: [facility];  Service: Orthopedics;  Laterality: Right;    No Known Allergies  Immunization History  Administered Date(s) Administered  . Pneumococcal Polysaccharide-23 01/11/2015    Family History   Problem Relation Age of Onset  . Heart disease Mother     unclear details  . Heart disease Father     unclear details  . Heart disease Sister     unclear details     Current Outpatient Prescriptions:  .  amLODipine (NORVASC) 5 MG tablet, TAKE ONE TABLET BY MOUTH ONCE DAILY, Disp: 180 tablet, Rfl: 3 .  apixaban (ELIQUIS) 2.5 MG TABS tablet, Take 1 tablet (2.5 mg total) by mouth 2 (two) times daily., Disp: 28 tablet, Rfl: 0 .  furosemide (LASIX) 20 MG tablet, Take 1 tablet (20 mg total) by mouth every other day., Disp: 15 tablet, Rfl: 11 .  Multiple Vitamin (MULTIVITAMIN) capsule, Take 1 capsule by mouth daily., Disp: , Rfl:  .  oxyCODONE-acetaminophen (ROXICET) 5-325 MG tablet, Take 1 tablet by mouth every 4 (four) hours as needed., Disp: 60 tablet, Rfl: 0 .  potassium chloride SA (K-DUR,KLOR-CON) 20 MEQ tablet, Take 1 tablet (20 mEq total) by mouth every other day. Take every other day with Lasix., Disp: 15 tablet, Rfl: 11 .  tiZANidine (ZANAFLEX) 2 MG tablet, Take 1 tablet (2 mg total) by mouth every 6 (six) hours as needed for muscle spasms., Disp: 60 tablet, Rfl: 0   Review of Systems     Objective:   Physical Exam  Vitals:   01/19/16 0959  BP: 126/66  Pulse: (!) 58  SpO2: 98%  Weight: 158 lb (71.7 kg)  Height:  5\' 11"  (1.803 m)   Discussion only visit     Assessment:       ICD-9-CM ICD-10-CM   1. Dyspnea and respiratory abnormality 786.09 R06.00 AMB referral to rehabilitation    R06.89   2. Panlobular emphysema (HCC) 492.8 J43.1 AMB referral to rehabilitation     umeclidinium bromide (INCRUSE ELLIPTA) 62.5 MCG/INH 1 puff  3. Physical deconditioning 799.3 R53.81 AMB referral to rehabilitation  4. Acute posthemorrhagic anemia 285.1 D62 AMB referral to rehabilitation  5. Pulmonary hypertension (HCC) 416.8 I27.2 AMB referral to rehabilitation  6. Chronic systolic heart failure (HCC) 428.22 I50.22 AMB referral to rehabilitation    Shortness of breath is due to  different reasons and these are all listed from #2 to #6      Plan:      Plan - start INCRUSE daily - take sample - Refer pulmonary rehabilitation -Follow-up  anemia which is improving with primary care physician  Gara Kroner, MD - Cardiac care per Dr. Radford Pax - no specific Rx for pulm htn - Flu shot in the fall  Follow-up 3 months or sooner if needed with APP or myself       > 50% of this > 25 min visit spent in face to face counseling or coordination of care

## 2016-01-19 NOTE — Patient Instructions (Addendum)
ICD-9-CM ICD-10-CM   1. Dyspnea and respiratory abnormality 786.09 R06.00     R06.89   2. Panlobular emphysema (HCC) 492.8 J43.1   3. Physical deconditioning 799.3 R53.81   4. Acute posthemorrhagic anemia 285.1 D62   5. Pulmonary hypertension (HCC) 416.8 I27.2   6. Chronic systolic heart failure (HCC) 428.22 I50.22    Shortness of breath is due to different reasons and these are all listed from #2 to #6  Plan - start INCRUSE daily - take sample - Refer pulmonary rehabilitation -Follow-up  anemia which is improving with primary care physician  Gara Kroner, MD - Cardiac care per Dr. Radford Pax - Flu shot in the fall  Follow-up 3 months or sooner if needed with APP or myself

## 2016-01-19 NOTE — Addendum Note (Signed)
Addended by: Len Blalock on: 01/19/2016 10:32 AM   Modules accepted: Orders

## 2016-01-24 ENCOUNTER — Telehealth: Payer: Self-pay | Admitting: Internal Medicine

## 2016-01-24 NOTE — Telephone Encounter (Signed)
lmtcb for Mandy.  

## 2016-01-25 NOTE — Telephone Encounter (Signed)
Spoke with Leafy Ro at [facility], states that pt's ONO showed that he desaturated through the night- she is faxing ONO to our office.  Will hold message until ONO is received.

## 2016-01-25 NOTE — Telephone Encounter (Signed)
lmtcb x2 for [place].

## 2016-01-30 ENCOUNTER — Telehealth: Payer: Self-pay | Admitting: Internal Medicine

## 2016-01-30 NOTE — Telephone Encounter (Signed)
Called and spoke to pt's wife, [name]. [name] questioned if the pt can use the Incruse inhaler twice a day. Advised her that pt is to only use this once per day. [name] stated the pt is now back at work and is more active and feels slightly more SOB than he did while at home. Pt does not have an albuterol inhaler. Pt is at work and cannot be contacted during the day. Ora requesting recs from MR.   MR please advise.Thanks.

## 2016-01-30 NOTE — Telephone Encounter (Signed)
Caryl Pina did you receive the ONO for this pt?

## 2016-01-30 NOTE — Telephone Encounter (Signed)
Will forward to [place] to look out for as she is now managing MR's paperwork.   Daneil Dan please advise.  Thanks!

## 2016-01-31 MED ORDER — ALBUTEROL SULFATE HFA 108 (90 BASE) MCG/ACT IN AERS: 2.0000 | INHALATION_SPRAY | Freq: Four times a day (QID) | RESPIRATORY_TRACT | 5 refills | Status: DC | PRN

## 2016-01-31 NOTE — Telephone Encounter (Signed)
Daneil Dan please advise if you have seen the papers faxed over from [place].  thanks

## 2016-01-31 NOTE — Telephone Encounter (Signed)
Spoke with pt's wife, [name], aware of recs.  rx for albuterol hfa sent to preferred pharmacy.  Nothing further needed regarding this.  Routing message back to [place] to address forms from [address].  Daneil Dan please advise.  Thanks!

## 2016-01-31 NOTE — Telephone Encounter (Signed)
He can try albuterol as needed in addition to scheduled incruse. Should not take albuterol more than 2-4 times per day  Dr. Brand Males, M.D., [facility].C.P Pulmonary and Critical Care Medicine Staff Physician [place] [facility] Pager: [phone], If no answer or between  15:00h - 7:00h: call [phone]  01/31/2016 1:35 AM

## 2016-02-01 NOTE — Telephone Encounter (Signed)
ONO results are currently in MR's possession.   MR please advise on results of ONO, Thanks.

## 2016-02-01 NOTE — Telephone Encounter (Signed)
Please see phone note from 01/24/2016 regarding pt's ONO results from [facility]. Will sign off.

## 2016-02-05 ENCOUNTER — Encounter (HOSPITAL_BASED_OUTPATIENT_CLINIC_OR_DEPARTMENT_OTHER): Payer: 59

## 2016-02-06 NOTE — Telephone Encounter (Signed)
Attempted to contact pt. No answer, no option to leave a message. Will try back.  

## 2016-02-06 NOTE — Telephone Encounter (Signed)
Review Robert Gardner oxygen desaturation test on 01/17/2016. Total time less than or equal to 88% was 5.3 minutes. Lowest pulse ox was 80%. He had 70 desaturation events in total averaging around 7 events per hour. He barely qualifies for nocturnal oxygen. If he wants to try  2 liters oxygen at night. at follow-up we can assess if this is helping him    Dr. Brand Males, M.D., [facility].C.P Pulmonary and Critical Care Medicine Staff Physician [place] [facility] Pager: [phone], If no answer or between  15:00h - 7:00h: call [phone]  02/06/2016 10:32 AM

## 2016-02-07 NOTE — Telephone Encounter (Signed)
Lm with family to have the pt return our call.

## 2016-02-08 NOTE — Telephone Encounter (Signed)
Spoke with pt. He is aware of ONO results. Pt does not wish to start oxygen at this time. Nothing further was needed.

## 2016-02-08 NOTE — Telephone Encounter (Signed)
Called and spoke with unnamed woman, advised that pt was working and unable to speak on the phone while he was at work.  Woman gave me a cell number to leave a vm for pt, and he can call back when he is on a break at work.   Called pt on cell # [phone], lmtcb for pt

## 2016-02-08 NOTE — Telephone Encounter (Signed)
Pt wife cb states pt is home and available to speak with the nurse now, [phone]

## 2016-02-10 ENCOUNTER — Telehealth: Payer: Self-pay | Admitting: Internal Medicine

## 2016-02-10 DIAGNOSIS — I272 Pulmonary hypertension, unspecified: Secondary | ICD-10-CM

## 2016-02-10 NOTE — Telephone Encounter (Signed)
Mandy from [facility] called and said that patient had ONO done and is wanting to be placed on O2 at night.    Per MR:  Review Robert Gardner oxygen desaturation test on 01/17/2016. Total time less than or equal to 88% was 5.3 minutes. Lowest pulse ox was 80%. He had 70 desaturation events in total averaging around 7 events per hour. He barely qualifies for nocturnal oxygen. If he wants to try  2 liters oxygen at night. at follow-up we can assess if this is helping him   O2 ordered. [place] notified. Patient notified.

## 2016-02-10 NOTE — Telephone Encounter (Signed)
Left message for Mandy from [facility] to call back.

## 2016-02-16 ENCOUNTER — Other Ambulatory Visit: Payer: Self-pay | Admitting: *Deleted

## 2016-02-16 MED ORDER — APIXABAN 2.5 MG PO TABS: 2.5000 mg | ORAL_TABLET | Freq: Two times a day (BID) | ORAL | 0 refills | Status: DC

## 2016-02-17 ENCOUNTER — Other Ambulatory Visit: Payer: Self-pay | Admitting: *Deleted

## 2016-02-17 MED ORDER — APIXABAN 2.5 MG PO TABS: 2.5000 mg | ORAL_TABLET | Freq: Two times a day (BID) | ORAL | 0 refills | Status: DC

## 2016-02-24 ENCOUNTER — Other Ambulatory Visit: Payer: Self-pay | Admitting: Family Medicine

## 2016-02-24 DIAGNOSIS — N63 Unspecified lump in unspecified breast: Secondary | ICD-10-CM

## 2016-03-01 ENCOUNTER — Encounter: Payer: Self-pay | Admitting: Cardiology

## 2016-03-01 ENCOUNTER — Telehealth: Payer: Self-pay

## 2016-03-01 ENCOUNTER — Ambulatory Visit
Admission: RE | Admit: 2016-03-01 | Discharge: 2016-03-01 | Disposition: A | Discharge status: Home / Self Care | Payer: 59 | Source: Ambulatory Visit | Attending: Family Medicine | Admitting: Family Medicine

## 2016-03-01 DIAGNOSIS — N63 Unspecified lump in unspecified breast: Secondary | ICD-10-CM

## 2016-03-01 NOTE — Telephone Encounter (Signed)
Courtesy call to patient for [employer]:  Called patient on home phone # listed. No answer. There was no way to leave VM. 03/01/16  Called daughter listed on DPR. Spoke with her and asked if she could have her father call me at his earliest convenience. 03/01/16  Was able to reach patient today. He is aware that a letter has been mailed to him from [employer]. 03/02/16

## 2016-03-15 ENCOUNTER — Encounter (INDEPENDENT_AMBULATORY_CARE_PROVIDER_SITE_OTHER): Payer: Self-pay

## 2016-03-15 ENCOUNTER — Telehealth: Payer: Self-pay | Admitting: Internal Medicine

## 2016-03-15 ENCOUNTER — Encounter: Payer: Self-pay | Admitting: Cardiology

## 2016-03-15 ENCOUNTER — Ambulatory Visit (INDEPENDENT_AMBULATORY_CARE_PROVIDER_SITE_OTHER): Payer: 59 | Admitting: Cardiology

## 2016-03-15 VITALS — BP 136/60 | HR 64 | Ht 71.0 in | Wt 160.6 lb

## 2016-03-15 DIAGNOSIS — I272 Other secondary pulmonary hypertension: Secondary | ICD-10-CM

## 2016-03-15 DIAGNOSIS — I10 Essential (primary) hypertension: Secondary | ICD-10-CM

## 2016-03-15 DIAGNOSIS — I340 Nonrheumatic mitral (valve) insufficiency: Secondary | ICD-10-CM

## 2016-03-15 DIAGNOSIS — I483 Typical atrial flutter: Secondary | ICD-10-CM

## 2016-03-15 DIAGNOSIS — I441 Atrioventricular block, second degree: Secondary | ICD-10-CM

## 2016-03-15 DIAGNOSIS — R0602 Shortness of breath: Secondary | ICD-10-CM

## 2016-03-15 DIAGNOSIS — I5022 Chronic systolic (congestive) heart failure: Secondary | ICD-10-CM | POA: Diagnosis not present

## 2016-03-15 DIAGNOSIS — I480 Paroxysmal atrial fibrillation: Secondary | ICD-10-CM | POA: Diagnosis not present

## 2016-03-15 LAB — BASIC METABOLIC PANEL
BUN: 23 mg/dL (ref 7–25)
CO2: 24 mmol/L (ref 20–31)
Calcium: 8.7 mg/dL (ref 8.6–10.3)
Chloride: 104 mmol/L (ref 98–110)
Creat: 1.49 mg/dL — ABNORMAL HIGH (ref 0.70–1.18)
Glucose, Bld: 93 mg/dL (ref 65–99)
POTASSIUM: 3.8 mmol/L (ref 3.5–5.3)
SODIUM: 136 mmol/L (ref 135–146)

## 2016-03-15 NOTE — Progress Notes (Signed)
Cardiology Office Note    Date:  03/15/2016   ID:  Robert Gardner, DOB [birth year], MRN [id number]  PCP:  Gara Kroner, MD  Cardiologist:  Fransico Him, MD   Chief Complaint  Patient presents with  . Atrial Fibrillation  . Hypertension  . Congestive Heart Failure    History of Present Illness:  Robert Gardner is a 75 y.o. male with past medical history of atrial fibrillation, chronic combined diastolic/systolic congestive heart failure, moderate mitral regurgitation, hypertension and atrial flutter. Echo 09/23/2015 showed mild LV dysfunction with EF 45-50% with mod MR, mildly dilated RV moderate pulmonary HTN PASP 73mmHg. He is on Eliquis for atrial flutter. He was seen by Dr. Lovena Le and underwent aflutter ablation.Cath 09/2015 showed essentially normal coronary arteries with elevated LVEDP c/w diastolic CHF. He has had some chest pain with exertion when walking long distances.  He has chronic DOE that is stable until he walks a long distance. He denies any palpitation or syncope.  Occasonally he will have some dizziness when walking.  He says that his ankles will swell on occasion.      Past Medical History:  Diagnosis Date  . Arthritis   . Atrial flutter ([place])    s/p flutter ablation  . Bell palsy   . Cancer ([place])    skin cancer right leg-melanoma  . CHF (congestive heart failure) ([place])   . CKD (chronic kidney disease), stage II   . Combined systolic and diastolic heart failure ([place])    EF 45% by echo 08/2014.  . Erectile dysfunction   . Hypertension   . Mitral regurgitation    Moderate by echo 08/2014  . Mobitz (type) I (Wenckebach's) atrioventricular block    a. By Holter 10/2012.  Marland Kitchen Normocytic anemia   . Second degree AV block, Mobitz type I 09/08/2015  . Shortness of breath dyspnea    with exertion   . Thyroid enlargement    a. Abnl xray 09/2013 - possible R thyroid enlargement.     Past Surgical History:  Procedure Laterality Date  . ABLATION  11-11-2013   CTI  ablation by Dr Lovena Le  . ATRIAL FLUTTER ABLATION N/A 11/11/2013   Procedure: ATRIAL FLUTTER ABLATION;  Surgeon: Evans Lance, MD;  Location: [facility] CATH LAB;  Service: Cardiovascular;  Laterality: N/A;  . CARDIAC CATHETERIZATION N/A 10/05/2015   Procedure: Left Heart Cath and Coronary Angiography;  Surgeon: Troy Sine, MD;  Location: [facility] CV LAB;  Service: Cardiovascular;  Laterality: N/A;  . CYST REMOVAL TRUNK     like a cyst  . Rectal tear     repair  . Resection of melanoma Right    Leg  . TOTAL KNEE ARTHROPLASTY Right 10/31/2015   Procedure: RIGHT TOTAL KNEE ARTHROPLASTY;  Surgeon: Frederik Pear, MD;  Location: [facility];  Service: Orthopedics;  Laterality: Right;    Current Medications: Outpatient Medications Prior to Visit  Medication Sig Dispense Refill  . albuterol (PROVENTIL HFA;VENTOLIN HFA) 108 (90 Base) MCG/ACT inhaler Inhale 2 puffs into the lungs every 6 (six) hours as needed for wheezing or shortness of breath. 1 Inhaler 5  . amLODipine (NORVASC) 5 MG tablet TAKE ONE TABLET BY MOUTH ONCE DAILY 180 tablet 3  . apixaban (ELIQUIS) 2.5 MG TABS tablet Take 1 tablet (2.5 mg total) by mouth 2 (two) times daily. 60 tablet 0  . furosemide (LASIX) 20 MG tablet Take 1 tablet (20 mg total) by mouth every other day. 15 tablet 11  . Multiple Vitamin (  MULTIVITAMIN) capsule Take 1 capsule by mouth daily.    . potassium chloride SA (K-DUR,KLOR-CON) 20 MEQ tablet Take 1 tablet (20 mEq total) by mouth every other day. Take every other day with Lasix. 15 tablet 11  . tiZANidine (ZANAFLEX) 2 MG tablet Take 1 tablet (2 mg total) by mouth every 6 (six) hours as needed for muscle spasms. 60 tablet 0  . umeclidinium bromide (INCRUSE ELLIPTA) 62.5 MCG/INH AEPB Inhale 1 puff into the lungs daily. 30 each 5  . oxyCODONE-acetaminophen (ROXICET) 5-325 MG tablet Take 1 tablet by mouth every 4 (four) hours as needed. 60 tablet 0   No facility-administered medications prior to visit.      Allergies:    Review of patient's allergies indicates no known allergies.   Social History   Social History  . Marital status: Married    Spouse name: N/A  . Number of children: N/A  . Years of education: N/A   Social History Main Topics  . Smoking status: Former Smoker    Packs/day: 1.00    Years: 42.00    Types: Cigarettes    Quit date: 12/21/1993  . Smokeless tobacco: Never Used  . Alcohol use No  . Drug use: No  . Sexual activity: No   Other Topics Concern  . None   Social History Narrative  . None     Family History:  The patient's family history includes Heart disease in his father, mother, and sister.   ROS:   Please see the history of present illness.    ROS All other systems reviewed and are negative.  No flowsheet data found.     PHYSICAL EXAM:   VS:  BP 136/60   Pulse 64   Ht 5\' 11"  (1.803 m)   Wt 160 lb 9.6 oz (72.8 kg)   BMI 22.40 kg/m    GEN: Well nourished, well developed, in no acute distress  HEENT: normal  Neck: no JVD, carotid bruits, or masses Cardiac: RRR; no murmurs, rubs, or gallops,no edema.  Intact distal pulses bilaterally.  Respiratory:  clear to auscultation bilaterally, normal work of breathing GI: soft, nontender, nondistended, + BS MS: no deformity or atrophy  Skin: warm and dry, no rash Neuro:  Alert and Oriented x 3, Strength and sensation are intact Psych: euthymic mood, full affect  Wt Readings from Last 3 Encounters:  03/15/16 160 lb 9.6 oz (72.8 kg)  01/19/16 158 lb (71.7 kg)  01/11/16 158 lb 6.4 oz (71.8 kg)      Studies/Labs Reviewed:   EKG:  EKG is ordered today.  The ekg ordered today demonstrates   Recent Labs: 11/01/2015: BUN 21; Creatinine, Ser 1.58; Potassium 4.2; Sodium 135 11/03/2015: B Natriuretic Peptide 542.7 01/11/2016: Hemoglobin 10.3; Platelets 241.0   Lipid Panel    Component Value Date/Time   CHOL 122 10/10/2013 0340   TRIG 34 10/10/2013 0340   HDL 69 10/10/2013 0340   CHOLHDL 1.8 10/10/2013 0340    VLDL 7 10/10/2013 0340   LDLCALC 46 10/10/2013 0340    Additional studies/ records that were reviewed today include:  none    ASSESSMENT:    1. Paroxysmal atrial fibrillation (HCC)   2. Chronic systolic heart failure ([place])   3. Mitral regurgitation   4. Essential hypertension   5. Typical atrial flutter ([place])   6. Pulmonary hypertension ([place])   7. Atrioventricular block, Mobitz type 1, Wenckebach      PLAN:  In order of problems listed above:  1.  PAF maintaining NSR.  Continue Xarelto.  Check BMET and CBC. 2. Chronic systolic CHF- he appears euvolemic on exam.  His weight is stable.  Since he is complaining of SOB I will check a BNP.   3. Moderate MR by echo 08/2015 - repeat echo  08/2016.  4. HTN - BP controlled on current meds.  Continue amlodipine 5. Typical atrial flutter s/p ablation with no reoccurence.  Continue Xarelto for CHADS2VASC score of 4.  6. Moderate pulmonary HTN PASP 57mmHg by echo 08/2015 - PFTs for workup of this showed possible COPD and he is followed by pulmonary.  Chest CT showed no evidence of PE.  Repeat echo 08/2016. He had an overnight pulse ox done by Dr. Chase Caller and is on nocturnal oxygen.  I will get a sleep study to rule out sleep apnea. 7. Type I second degree AV block - he is asymptomatic with HR in the upper 50's.      Medication Adjustments/Labs and Tests Ordered: Current medicines are reviewed at length with the patient today.  Concerns regarding medicines are outlined above.  Medication changes, Labs and Tests ordered today are listed in the Patient Instructions below.  There are no Patient Instructions on file for this visit.   Signed, Fransico Him, MD  03/15/2016 2:53 PM    [address], [place], [place]  [address] Phone: [phone]; Fax: [phone]

## 2016-03-15 NOTE — Patient Instructions (Signed)
Medication Instructions:  Your physician recommends that you continue on your current medications as directed. Please refer to the Current Medication list given to you today.   Labwork: Your physician recommends that you have lab work today: bmet/cbc/bnp   Testing/Procedures: Your physician has requested that you have an echocardiogram. Echocardiography is a painless test that uses sound waves to create images of your heart. It provides your doctor with information about the size and shape of your heart and how well your heart's chambers and valves are working. This procedure takes approximately one hour. There are no restrictions for this procedure.  Your physician has recommended that you have a sleep study. This test records several body functions during sleep, including: brain activity, eye movement, oxygen and carbon dioxide blood levels, heart rate and rhythm, breathing rate and rhythm, the flow of air through your mouth and nose, snoring, body muscle movements, and chest and belly movement.    Follow-Up: Your physician wants you to follow-up in: 6 months with Dr.Turner.  You will receive a reminder letter in the mail two months in advance. If you don't receive a letter, please call our office to schedule the follow-up appointment.   Any Other Special Instructions Will Be Listed Below (If Applicable).     If you need a refill on your cardiac medications before your next appointment, please call your pharmacy.

## 2016-03-15 NOTE — Telephone Encounter (Signed)
Called and spoke with pts wife and she stated that the pt has been working and his job sent him home from work and stated that they will need this form completed and verified by MR that the pt is ok to work.  She stated that he walks a lot at work and they wanted to make sure the pt was safe to work.  Pts wife will drop off this form tomorrow.  Will forward to [place] to look out for the form.

## 2016-03-16 LAB — CBC WITH DIFFERENTIAL/PLATELET
BASOS PCT: 0 %
Basophils Absolute: 0 cells/uL (ref 0–200)
EOS PCT: 2 %
Eosinophils Absolute: 100 cells/uL (ref 15–500)
HEMATOCRIT: 30.1 % — AB (ref 38.5–50.0)
HEMOGLOBIN: 9.8 g/dL — AB (ref 13.2–17.1)
LYMPHS ABS: 1650 {cells}/uL (ref 850–3900)
Lymphocytes Relative: 33 %
MCH: 29.9 pg (ref 27.0–33.0)
MCHC: 32.6 g/dL (ref 32.0–36.0)
MCV: 91.8 fL (ref 80.0–100.0)
MONO ABS: 700 {cells}/uL (ref 200–950)
MPV: 9.7 fL (ref 7.5–12.5)
Monocytes Relative: 14 %
Neutro Abs: 2550 cells/uL (ref 1500–7800)
Neutrophils Relative %: 51 %
Platelets: 212 10*3/uL (ref 140–400)
RBC: 3.28 MIL/uL — AB (ref 4.20–5.80)
RDW: 15.3 % — AB (ref 11.0–15.0)
WBC: 5 10*3/uL (ref 3.8–10.8)

## 2016-03-16 LAB — BRAIN NATRIURETIC PEPTIDE: BRAIN NATRIURETIC PEPTIDE: 482.7 pg/mL — AB (ref <100)

## 2016-03-19 NOTE — Telephone Encounter (Signed)
Robert Gardner - do you have this form? Thanks!

## 2016-03-20 NOTE — Telephone Encounter (Signed)
This was faxed on 9.14.17 and placed in MR's scan folder. Will re-fax to [place]. Called and spoke to pt and informed him we are re-faxing this. Pt verbalized understanding and denied any further questions or concerns at this time.

## 2016-03-26 ENCOUNTER — Ambulatory Visit (INDEPENDENT_AMBULATORY_CARE_PROVIDER_SITE_OTHER): Payer: 59 | Admitting: Physician Assistant

## 2016-03-26 ENCOUNTER — Ambulatory Visit: Payer: 59 | Admitting: Physician Assistant

## 2016-03-26 ENCOUNTER — Encounter: Payer: Self-pay | Admitting: Physician Assistant

## 2016-03-26 VITALS — BP 126/60 | HR 94 | Ht 71.0 in | Wt 156.1 lb

## 2016-03-26 DIAGNOSIS — I480 Paroxysmal atrial fibrillation: Secondary | ICD-10-CM

## 2016-03-26 DIAGNOSIS — I5023 Acute on chronic systolic (congestive) heart failure: Secondary | ICD-10-CM

## 2016-03-26 DIAGNOSIS — N183 Chronic kidney disease, stage 3 unspecified: Secondary | ICD-10-CM

## 2016-03-26 DIAGNOSIS — I10 Essential (primary) hypertension: Secondary | ICD-10-CM | POA: Diagnosis not present

## 2016-03-26 DIAGNOSIS — I420 Dilated cardiomyopathy: Secondary | ICD-10-CM

## 2016-03-26 LAB — BASIC METABOLIC PANEL
BUN: 27 mg/dL — AB (ref 7–25)
CALCIUM: 9.3 mg/dL (ref 8.6–10.3)
CO2: 26 mmol/L (ref 20–31)
CREATININE: 1.52 mg/dL — AB (ref 0.70–1.18)
Chloride: 105 mmol/L (ref 98–110)
Glucose, Bld: 70 mg/dL (ref 65–99)
Potassium: 4.1 mmol/L (ref 3.5–5.3)
Sodium: 137 mmol/L (ref 135–146)

## 2016-03-26 LAB — BRAIN NATRIURETIC PEPTIDE: BRAIN NATRIURETIC PEPTIDE: 423.5 pg/mL — AB (ref <100)

## 2016-03-26 LAB — TSH: TSH: 0.18 m[IU]/L — AB (ref 0.40–4.50)

## 2016-03-26 MED ORDER — FUROSEMIDE 20 MG PO TABS: 20.0000 mg | ORAL_TABLET | Freq: Every day | ORAL | 11 refills | Status: DC

## 2016-03-26 NOTE — Patient Instructions (Signed)
Your physician has recommended you make the following change in your medication:  INCREASE  FUROSEMIDE TO [place]   Your physician recommends that you return for lab work in:   TODAY  BMET BNP  AND  TSH   Your physician recommends that you schedule a follow-up appointment in:  2 -[place] PAC AND  4 MONTHS  WITH DR  Radford Pax

## 2016-03-26 NOTE — Progress Notes (Signed)
Cardiology Office Note    Date:  03/26/2016   ID:  Robert Gardner, DOB 1941/06/18, MRN [id number]  PCP:  Gara Kroner, MD  Cardiologist: Dr Radford Pax  Chief Complaint  Patient presents with  . Shortness of Breath    History of Present Illness:  Robert Gardner is a 75 y.o. male with past medical history of atrial fibrillation on Xarelto for CHADSVASC 4, chronic combined diastolic/systolic congestive heart failure, moderate mitral regurgitation, hypertension and atrial flutter. Echo 09/23/2015 showed mild LV dysfunction with EF 45-50% with mod MR, mildly dilated RV moderate pulmonary HTN PASP 31mmHg.  He is on Eliquis for atrial flutter.  He was seen by Dr. Lovena Le and underwent aflutter ablation. Cath 09/2015 showed essentially normal coronary arteries with elevated LVEDP c/w diastolic CHF, chronic pulmonary  PASP 58 mm Hg echo 08/2015.  He has had some chest pain with exertion when walking long distances.  He has chronic DOE that is stable until he walks a long distance.  He denies any palpitation or syncope.  Occasonally he will have some dizziness when walking.  He says that his ankles will swell on occasion.    Patient was seen by Dr. Radford Pax 03/15/16  complaining of shortness of breath. BNP was 482 and Lasix was increased. Creatinine was 1.49 which is actually lower than his baseline. So had right gynecomastia and mammogram was ordered that showed no evidence of malignancy. He is here today for follow-up. He has lost 4 pounds. He was taking Lasix 40 mg daily for 3 days and is now back to 20 mg every other day. He still has some shortness of breath and mild swelling.     Past Medical History:  Diagnosis Date  . Arthritis   . Atrial flutter ([place])    s/p flutter ablation  . Bell palsy   . Cancer ([place])    skin cancer right leg-melanoma  . CHF (congestive heart failure) ([place])   . CKD (chronic kidney disease), stage II   . Combined systolic and diastolic heart failure ([place])    EF 45% by echo  08/2014.  . Erectile dysfunction   . Hypertension   . Mitral regurgitation    Moderate by echo 08/2014  . Mobitz (type) I (Wenckebach's) atrioventricular block    a. By Holter 10/2012.  Marland Kitchen Normocytic anemia   . Second degree AV block, Mobitz type I 09/08/2015  . Shortness of breath dyspnea    with exertion   . Thyroid enlargement    a. Abnl xray 09/2013 - possible R thyroid enlargement.     Past Surgical History:  Procedure Laterality Date  . ABLATION  11-11-2013   CTI ablation by Dr Lovena Le  . ATRIAL FLUTTER ABLATION N/A 11/11/2013   Procedure: ATRIAL FLUTTER ABLATION;  Surgeon: [place] Lance, MD;  Location: [facility] CATH LAB;  Service: Cardiovascular;  Laterality: N/A;  . CARDIAC CATHETERIZATION N/A 10/05/2015   Procedure: Left Heart Cath and Coronary Angiography;  Surgeon: Troy Sine, MD;  Location: [place] CV LAB;  Service: Cardiovascular;  Laterality: N/A;  . CYST REMOVAL TRUNK     like a cyst  . Rectal tear     repair  . Resection of melanoma Right    Leg  . TOTAL KNEE ARTHROPLASTY Right 10/31/2015   Procedure: RIGHT TOTAL KNEE ARTHROPLASTY;  Surgeon: Frederik Pear, MD;  Location: [facility];  Service: Orthopedics;  Laterality: Right;    Current Medications: Outpatient Medications Prior to Visit  Medication Sig Dispense Refill  .  albuterol (PROVENTIL HFA;VENTOLIN HFA) 108 (90 Base) MCG/ACT inhaler Inhale 2 puffs into the lungs every 6 (six) hours as needed for wheezing or shortness of breath. 1 Inhaler 5  . amLODipine (NORVASC) 5 MG tablet TAKE ONE TABLET BY MOUTH ONCE DAILY 180 tablet 3  . apixaban (ELIQUIS) 2.5 MG TABS tablet Take 1 tablet (2.5 mg total) by mouth 2 (two) times daily. 60 tablet 0  . Multiple Vitamin (MULTIVITAMIN) capsule Take 1 capsule by mouth daily.    Marland Kitchen oxyCODONE-acetaminophen (PERCOCET/ROXICET) 5-325 MG tablet Take 1 tablet by mouth every 4 (four) hours as needed for severe pain.    . potassium chloride SA (K-DUR,KLOR-CON) 20 MEQ tablet Take 1 tablet (20 mEq  total) by mouth every other day. Take every other day with Lasix. 15 tablet 11  . tiZANidine (ZANAFLEX) 2 MG tablet Take 1 tablet (2 mg total) by mouth every 6 (six) hours as needed for muscle spasms. 60 tablet 0  . umeclidinium bromide (INCRUSE ELLIPTA) 62.5 MCG/INH AEPB Inhale 1 puff into the lungs daily. 30 each 5  . furosemide (LASIX) 20 MG tablet Take 1 tablet (20 mg total) by mouth every other day. 15 tablet 11   No facility-administered medications prior to visit.      Allergies:   Review of patient's allergies indicates no known allergies.   Social History   Social History  . Marital status: Married    Spouse name: N/A  . Number of children: N/A  . Years of education: N/A   Social History Main Topics  . Smoking status: Former Smoker    Packs/day: 1.00    Years: 42.00    Types: Cigarettes    Quit date: 12/21/1993  . Smokeless tobacco: Never Used  . Alcohol use No  . Drug use: No  . Sexual activity: No   Other Topics Concern  . None   Social History Narrative  . None     Family History:  The patient's family history includes Heart disease in his father, mother, and sister.   ROS:   Please see the history of present illness.    Review of Systems  Constitution: Negative.  HENT: Negative.   Cardiovascular: Negative.   Respiratory: Negative.   Endocrine: Negative.   Hematologic/Lymphatic: Negative.   Musculoskeletal: Negative.   Gastrointestinal: Negative.   Genitourinary: Negative.   Neurological: Negative.    All other systems reviewed and are negative.   PHYSICAL EXAM:   VS:  BP 126/60   Pulse 94   Ht 5\' 11"  (1.803 m)   Wt 156 lb 1.9 oz (70.8 kg)   BMI 21.77 kg/m   Physical Exam  GEN: Well nourished, well developed, in no acute distress Neck: Increased JVD, no carotid bruits, or masses Cardiac: Irregular; 2/6 systolic murmur at the left sternal border, right breast palpable mass that is movable  Respiratory:  clear to auscultation bilaterally,  normal work of breathing GI: soft, nontender, nondistended, + BS Ext: trace edema bilaterally without cyanosis, clubbing, or  Good distal pulses bilaterally MS: no deformity or atrophy Skin: warm and dry, no rash Psych: euthymic mood, full affect  Wt Readings from Last 3 Encounters:  03/26/16 156 lb 1.9 oz (70.8 kg)  03/15/16 160 lb 9.6 oz (72.8 kg)  01/19/16 158 lb (71.7 kg)      Studies/Labs Reviewed:   EKG:  EKG i ordered today.  Recent Labs: 03/15/2016: Brain Natriuretic Peptide 482.7; BUN 23; Creat 1.49; Hemoglobin 9.8; Platelets 212; Potassium 3.8; Sodium 136  Lipid Panel    Component Value Date/Time   CHOL 122 10/10/2013 0340   TRIG 34 10/10/2013 0340   HDL 69 10/10/2013 0340   CHOLHDL 1.8 10/10/2013 0340   VLDL 7 10/10/2013 0340   LDLCALC 46 10/10/2013 0340    Additional studies/ records that were reviewed today include:  2-D echo 09/23/15 Study Conclusions   - Left ventricle: The cavity size was normal. There was mild   concentric hypertrophy. Systolic function was mildly reduced. The   estimated ejection fraction was in the range of 45% to 50%. Wall   motion was normal; there were no regional wall motion   abnormalities. - Aortic valve: There was trivial regurgitation. - Mitral valve: There was moderate regurgitation. - Right ventricle: The cavity size was mildly dilated. Wall   thickness was normal. - Pulmonic valve: There was trivial regurgitation. - Pulmonary arteries: PA peak pressure: 58 mm Hg (S).   Impressions:   - Mildly reduced LVF with EF 45%, moderate MR, mildly thickened MV   leaflets, mildly thickened AV leaflets, moderate pulmonary HTN.   Compared to prior study, PASP has increased from 43mmHg to   23mmHg. The right ventricular systolic pressure was increased   consistent with moderate pulmonary hypertension.   Catheterization 10/05/15 Conclusion   Mid RCA to Dist RCA lesion, 10% stenosed.   No significant coronary obstructive disease  with only minimal 10% luminal irregularity of the mid RCA proximal to the region of the acute margin.   Probable nonischemic cardiopathy with previous noninvasive assessment of LV function at 40-45% ejection fraction.  LVEDP today was 26 mm Hg.   RECOMMENDATION: No significant coronary obstructive disease was demonstrated.  The patient will be given preoperative clearance for his knee surgery.  Eliquis will be reinstituted tomorrow for his chronic anticoagulation and will need to be held 48 hours prior to his surgical procedure.    Indications  Abnormal nuclear stress test [R94.39 (ICD-10-CM)]       ASSESSMENT:    1. Acute on chronic systolic CHF (congestive heart failure) ([place])   2. Congestive dilated cardiomyopathy (HCC)   3. Paroxysmal atrial fibrillation ([place])   4. Essential hypertension   5. CKD (chronic kidney disease), stage III      PLAN:  In order of problems listed above:  Acute on chronic systolic CHF with congestive dilated cardiomyopathy EF 45-50%. Patient's weight is down 4 pounds but he still has elevated JVD and dyspnea. Will increase Lasix to 20 mg daily. 2 g sodium diet. Check labs today. Follow-up with me in 3-4 weeks and Dr. Radford Pax in 4 months.  Paroxysmal atrial fibrillation on Eliquis  Essential hypertension controlled  CK D stage III. Creatinine was actually lower than usual when checked 2 weeks ago. We'll recheck today and again in 1 month on higher dose Lasix.    Medication Adjustments/Labs and Tests Ordered: Current medicines are reviewed at length with the patient today.  Concerns regarding medicines are outlined above.  Medication changes, Labs and Tests ordered today are listed in the Patient Instructions below. Patient Instructions  Your physician has recommended you make the following change in your medication:  INCREASE  FUROSEMIDE TO 20 MG  EVERY DAY   Your physician recommends that you return for lab work in:   TODAY  BMET BNP  AND   TSH   Your physician recommends that you schedule a follow-up appointment in:  2 -[place] PAC AND  4 MONTHS  WITH  DR  Marcy Siren, Ermalinda Barrios, PA-C  03/26/2016 11:55 AM    [facility] [place], [place], [place]  [address] Phone: [phone]; Fax: [phone]

## 2016-03-29 ENCOUNTER — Ambulatory Visit (INDEPENDENT_AMBULATORY_CARE_PROVIDER_SITE_OTHER): Payer: 59 | Admitting: Adult Health

## 2016-03-29 ENCOUNTER — Encounter: Payer: Self-pay | Admitting: Adult Health

## 2016-03-29 DIAGNOSIS — J431 Panlobular emphysema: Secondary | ICD-10-CM | POA: Diagnosis not present

## 2016-03-29 MED ORDER — TIOTROPIUM BROMIDE MONOHYDRATE 2.5 MCG/ACT IN AERS
2.0000 | INHALATION_SPRAY | Freq: Every day | RESPIRATORY_TRACT | 5 refills | Status: DC
Start: 2016-03-29 — End: 2016-08-20

## 2016-03-29 NOTE — Progress Notes (Signed)
Subjective:    Patient ID: Robert Gardner, male    DOB: May 29, 1941, 75 y.o.   MRN: [id number]  HPI  75 year old male former smoker followed for Moderate COPD/Emphysema  Combined diastolic systolic congestive heart failure Atrial  fibrillation on Eliquis  TEST  11/18/15:  Full pulmonary function test 11/18/2015 shows FVC 2.23 L/58%. FEV1 1.82 L/64% with a ratio of 82. There is no broncho-dilator response. DLCO is 13.4/41% and significantly reduced. Overall restriction with low DLCO. 01/11/2016: Walking desaturation test: Robert Gardner from 99% to 94%.. Stop at the end of 2 laps due to lightheadedness. This was on room air. He did not complete his third lap.  01/11/2016: CBC shows hemoglobin of 10.3 g percent and this is improved since postoperative blood loss in May 2017.  01/13/2016: High-resolution CT chest personally visualized negative findings. He has bilateral apical emphysema associated with air trapping. He also has evidence of pulmonary hypertension. There is no fibrotic lung disease or nodules. Previously on glass opacities have resolved.  03/29/2016 acute office visit Patient returns for work in visit. Patient was seen in July for COPD. Patient was found to have emphysema on his high resolution CT chest with no evidence of interstitial lung disease. PFT showed restrictive lung disease with an FEV1 at 64% and a ratio of 82. DLCO was decreased at 41%. Walk test w/ no desats in office.  Patient was started on Incruse . Patient says that shortly after starting this inhaler that he noticed that he had enlarged right breast tissue. Patient was seen by his primary care physician and set up for a mammogram.  This was done on 03/01/2016 that showed no concerning masses. Felt to have right gynecomastia. Medication review was done, denies use of Aldactone or finasteride.. Explained to him and his wife that it would be uncommon for increased to have this particular side effect. She is on a calcium  channel blocker with amlodipine, for a long time. Calcium channel blockers can cause gynecomastia. She denies any chest pain, orthopnea, PND, or increased leg swelling. He declines a flu shot.    Past Medical History:  Diagnosis Date  . Arthritis   . Atrial flutter ([place])    s/p flutter ablation  . Bell palsy   . Cancer ([place])    skin cancer right leg-melanoma  . CHF (congestive heart failure) ([place])   . CKD (chronic kidney disease), stage II   . Combined systolic and diastolic heart failure ([place])    EF 45% by echo 08/2014.  . Erectile dysfunction   . Hypertension   . Mitral regurgitation    Moderate by echo 08/2014  . Mobitz (type) I (Wenckebach's) atrioventricular block    a. By Holter 10/2012.  Marland Kitchen Normocytic anemia   . Second degree AV block, Mobitz type I 09/08/2015  . Shortness of breath dyspnea    with exertion   . Thyroid enlargement    a. Abnl xray 09/2013 - possible R thyroid enlargement.    Current Outpatient Prescriptions on File Prior to Visit  Medication Sig Dispense Refill  . albuterol (PROVENTIL HFA;VENTOLIN HFA) 108 (90 Base) MCG/ACT inhaler Inhale 2 puffs into the lungs every 6 (six) hours as needed for wheezing or shortness of breath. 1 Inhaler 5  . amLODipine (NORVASC) 5 MG tablet TAKE ONE TABLET BY MOUTH ONCE DAILY 180 tablet 3  . apixaban (ELIQUIS) 2.5 MG TABS tablet Take 1 tablet (2.5 mg total) by mouth 2 (two) times daily. 60 tablet 0  . furosemide (LASIX)  20 MG tablet Take 1 tablet (20 mg total) by mouth daily. 30 tablet 11  . Multiple Vitamin (MULTIVITAMIN) capsule Take 1 capsule by mouth daily.    Marland Kitchen oxyCODONE-acetaminophen (PERCOCET/ROXICET) 5-325 MG tablet Take 1 tablet by mouth every 4 (four) hours as needed for severe pain.    . potassium chloride SA (K-DUR,KLOR-CON) 20 MEQ tablet Take 1 tablet (20 mEq total) by mouth every other day. Take every other day with Lasix. 15 tablet 11  . tiZANidine (ZANAFLEX) 2 MG tablet Take 1 tablet (2 mg total) by mouth every 6  (six) hours as needed for muscle spasms. 60 tablet 0   No current facility-administered medications on file prior to visit.      Review of Systems Constitutional:   No  weight loss, night sweats,  Fevers, chills, + fatigue, or  lassitude.  HEENT:   No headaches,  Difficulty swallowing,  Tooth/dental problems, or  Sore throat,                No sneezing, itching, ear ache, nasal congestion, post nasal drip,   CV:  No chest pain,  Orthopnea, PND, swelling in lower extremities, anasarca, dizziness, palpitations, syncope.   GI  No heartburn, indigestion, abdominal pain, nausea, vomiting, diarrhea, change in bowel habits, loss of appetite, bloody stools.   Resp: No shortness of breath with exertion or at rest.  No excess mucus, no productive cough,  No non-productive cough,  No coughing up of blood.  No change in color of mucus.  No wheezing.  No chest wall deformity  Skin: no rash or lesions.  GU: no dysuria, change in color of urine, no urgency or frequency.  No flank pain, no hematuria   MS:  No joint pain or swelling.  No decreased range of motion.  No back pain.  Psych:  No change in mood or affect. No depression or anxiety.  No memory loss.         Objective:   Physical Exam  Vitals:   03/29/16 1520  BP: 128/84  Pulse: (!) 58  Temp: 97.8 F (36.6 C)  TempSrc: Oral  SpO2: 100%  Weight: 159 lb (72.1 kg)  Height: 5\' 11"  (1.803 m)   GEN: A/Ox3; pleasant , NAD, Elderly   HEENT:  [place]/AT,  EACs-clear, TMs-wnl, NOSE-clear, THROAT-clear, no lesions, no postnasal drip or exudate noted.   NECK:  Supple w/ fair ROM; no JVD; normal carotid impulses w/o bruits; no thyromegaly or nodules palpated; no lymphadenopathy.    RESP  decreased breath sounds in the bases  no accessory muscle use, no dullness to percussion  BREAST : Enlarged right breast tissue notable  CARD:  RRR, no m/r/g  , no peripheral edema, pulses intact, no cyanosis or clubbing.  GI:   Soft & nt; nml bowel  sounds; no organomegaly or masses detected.   Musco: Warm bil, no deformities or joint swelling noted.   Neuro: alert, no focal deficits noted.    Skin: Warm, no lesions or rashes  Tammy Parrett NP-C  [facility]  03/29/2016       Assessment & Plan:

## 2016-03-29 NOTE — Assessment & Plan Note (Signed)
COPD/emphysema-very doubtful that increase is contributing to his gynecomastia. However, will stop this medication change over to Spiriva. Obviously if his gynecomastia resolves, we will report this as a potential side effect. There was patient education was given on gynecomastia. In lieu of his negative mammogram results and advised him to follow-up his primary care physician.  Patient is to begin Spiriva. Follow back up here in the office in one month as planned with Dr. Chase Caller

## 2016-03-29 NOTE — Patient Instructions (Signed)
Stop Incruse .  It appears you have Gynecomastia. (enlarged breast tissue in male ) . You mammogram was ok.  Begin Spiriva Respimat 2 puffs daily .  Follow up with Dr. Chase Caller in 2 weeks and As needed

## 2016-04-02 ENCOUNTER — Telehealth: Payer: Self-pay | Admitting: Internal Medicine

## 2016-04-02 NOTE — Telephone Encounter (Signed)
Rec'd disabilty paperwork (the [place]) from [employer] - will forward to [place] for MR to complete-pr

## 2016-04-03 NOTE — Telephone Encounter (Signed)
This has been given to MR to be completed.

## 2016-04-05 NOTE — Telephone Encounter (Signed)
MR please advise if this has been completed yet. Thanks.  

## 2016-04-05 NOTE — Telephone Encounter (Signed)
Neka called from [employer] - pt anxious for paperwork to be completed - he hasn't received any payments for the last 9 weeks -pr

## 2016-04-06 ENCOUNTER — Telehealth: Payer: Self-pay | Admitting: *Deleted

## 2016-04-06 NOTE — Telephone Encounter (Signed)
See 03/15/16 phone note. I thought I did a similar form. Also is he wanting to go back to work? Or wants to stay off work?

## 2016-04-06 NOTE — Telephone Encounter (Signed)
Called [employer] and spoke with Diamond Nickel The forms that MR is remembering from Sept 2017 was a form regarding pt returning to work and performing duties at work The forms from this week was the Attending Physician Statement so that pt could be paid We do not have a copy of the other form scanned into pt's chart, but pt has not yet returned to work  Have spoken with pt's spouse [name] who does have a copy of the form MR filled out last month and will bring this to his upcoming 10.31.17 appt with MR so that we can send a copy for scan.  Nothing further needed; will sign off

## 2016-04-06 NOTE — Telephone Encounter (Signed)
Prior Authorization started for sleep study:   Ref [id number] Approval is pending with [facility].

## 2016-04-13 NOTE — Progress Notes (Signed)
04/13/16 Per Kia at [facility] sleep center, patient was scheduled for sleep study on 11/30, however, wife called today and stated patient didn't want to have the sleep study done. Received approval for sleep study from [employer]. Will leave for [facility] when she returns.

## 2016-04-16 ENCOUNTER — Encounter: Payer: Self-pay | Admitting: Physician Assistant

## 2016-04-16 ENCOUNTER — Telehealth: Payer: Self-pay

## 2016-04-16 ENCOUNTER — Encounter (INDEPENDENT_AMBULATORY_CARE_PROVIDER_SITE_OTHER): Payer: Self-pay

## 2016-04-16 ENCOUNTER — Ambulatory Visit (INDEPENDENT_AMBULATORY_CARE_PROVIDER_SITE_OTHER): Payer: 59 | Admitting: Physician Assistant

## 2016-04-16 VITALS — BP 126/76 | HR 44 | Ht 71.0 in | Wt 158.1 lb

## 2016-04-16 DIAGNOSIS — I420 Dilated cardiomyopathy: Secondary | ICD-10-CM | POA: Diagnosis not present

## 2016-04-16 DIAGNOSIS — N183 Chronic kidney disease, stage 3 unspecified: Secondary | ICD-10-CM

## 2016-04-16 DIAGNOSIS — I480 Paroxysmal atrial fibrillation: Secondary | ICD-10-CM

## 2016-04-16 DIAGNOSIS — I5022 Chronic systolic (congestive) heart failure: Secondary | ICD-10-CM

## 2016-04-16 DIAGNOSIS — I10 Essential (primary) hypertension: Secondary | ICD-10-CM

## 2016-04-16 DIAGNOSIS — N62 Hypertrophy of breast: Secondary | ICD-10-CM | POA: Insufficient documentation

## 2016-04-16 LAB — BASIC METABOLIC PANEL
BUN: 27 mg/dL — ABNORMAL HIGH (ref 7–25)
CALCIUM: 9.1 mg/dL (ref 8.6–10.3)
CO2: 26 mmol/L (ref 20–31)
CREATININE: 1.82 mg/dL — AB (ref 0.70–1.18)
Chloride: 106 mmol/L (ref 98–110)
GLUCOSE: 77 mg/dL (ref 65–99)
Potassium: 4.4 mmol/L (ref 3.5–5.3)
Sodium: 138 mmol/L (ref 135–146)

## 2016-04-16 MED ORDER — TORSEMIDE 20 MG PO TABS: 10.0000 mg | ORAL_TABLET | Freq: Every day | ORAL | 9 refills | Status: DC

## 2016-04-16 NOTE — Patient Instructions (Signed)
Medication Instructions:  Your physician has recommended you make the following change in your medication:  1. Discontinue Lasix 2. Start Torsemide (10 mg ) one half tablet daily   Labwork: Your physician recommends that you have lab work today: bmet   Testing/Procedures: -None  Follow-Up: Your physician recommends that you keep your scheduled  follow-up appointment with Dr. Radford Pax.    Any Other Special Instructions Will Be Listed Below (If Applicable).     If you need a refill on your cardiac medications before your next appointment, please call your pharmacy.

## 2016-04-16 NOTE — Progress Notes (Signed)
Cardiology Office Note    Date:  04/16/2016   ID:  [name], DOB [birth year], MRN [id number]  PCP:  Gara Kroner, MD  Cardiologist: Dr. Radford Pax  Chief Complaint  Patient presents with  . Follow-up    History of Present Illness:  Robert Gardner is a 75 y.o. male with past medical history of atrial fibrillation, chronic combined diastolic/systolic congestive heart failure, moderate mitral regurgitation, hypertension and atrial flutter. Echo 09/23/2015 showed mild LV dysfunction with EF 45-50% with mod MR, mildly dilated RV moderate pulmonary HTN PASP 33mmHg.  He is on Eliquis for atrial flutter.  He was seen by Dr. Lovena Le and underwent aflutter ablation. Cath 09/2015 showed essentially normal coronary arteries with elevated LVEDP c/w diastolic CHF.  I saw the patient 03/26/16 in follow-up for acute on chronic combined CHF. He has lost 4 pounds and was taking Lasix 40 mg daily for 3 days but then back to 20 mg every other day. He still was short of breath and had mild swelling. Creatinine was 1.49 which was lower than his baseline. I increase Lasix to 20 mg daily. Creatinine that day was 1.52.  Patient's breathing is a little better but he still has chronic dyspnea on exertion. His swelling is down. His biggest complaint is gynecomastia. His right breast swells and hurts at times. He thinks it is coming from the Lasix.   Past Medical History:  Diagnosis Date  . Arthritis   . Atrial flutter ([place])    s/p flutter ablation  . Bell palsy   . Cancer ([place])    skin cancer right leg-melanoma  . CHF (congestive heart failure) ([place])   . CKD (chronic kidney disease), stage II   . Combined systolic and diastolic heart failure ([place])    EF 45% by echo 08/2014.  . Erectile dysfunction   . Hypertension   . Mitral regurgitation    Moderate by echo 08/2014  . Mobitz (type) I (Wenckebach's) atrioventricular block    a. By Holter 10/2012.  Marland Kitchen Normocytic anemia   . Second degree AV block, Mobitz type  I 09/08/2015  . Shortness of breath dyspnea    with exertion   . Thyroid enlargement    a. Abnl xray 09/2013 - possible R thyroid enlargement.     Past Surgical History:  Procedure Laterality Date  . ABLATION  11-11-2013   CTI ablation by Dr Lovena Le  . ATRIAL FLUTTER ABLATION N/A 11/11/2013   Procedure: ATRIAL FLUTTER ABLATION;  Surgeon: Evans Lance, MD;  [facility] C[facility] LAB;  Service: Cardiovascular;  Laterality: N/A;  . CARDIAC CATHETERIZATION N/A 10/05/2015   Procedure: Left Heart Cath and Coronary Angiography;  Surgeon: Troy Sine, MD;  Location: [place] CV LAB;  Service: Cardiovascular;  Laterality: N/A;  . CYST REMOVAL TRUNK     like a cyst  . Rectal tear     repair  . Resection of melanoma Right    Leg  . TOTAL KNEE ARTHROPLASTY Right 10/31/2015   Procedure: RIGHT TOTAL KNEE ARTHROPLASTY;  Surgeon: Frederik Pear, MD;  Location: [place];  Service: Orthopedics;  Laterality: Right;    Current Medications: Outpatient Medications Prior to Visit  Medication Sig Dispense Refill  . albuterol (PROVENTIL HFA;VENTOLIN HFA) 108 (90 Base) MCG/ACT inhaler Inhale 2 puffs into the lungs every 6 (six) hours as needed for wheezing or shortness of breath. 1 Inhaler 5  . amLODipine (NORVASC) 5 MG tablet TAKE ONE TABLET BY MOUTH ONCE DAILY 180 tablet 3  .  apixaban (ELIQUIS) 2.5 MG TABS tablet Take 1 tablet (2.5 mg total) by mouth 2 (two) times daily. 60 tablet 0  . Multiple Vitamin (MULTIVITAMIN) capsule Take 1 capsule by mouth daily.    Marland Kitchen oxyCODONE-acetaminophen (PERCOCET/ROXICET) 5-325 MG tablet Take 1 tablet by mouth every 4 (four) hours as needed for severe pain.    . potassium chloride SA (K-DUR,KLOR-CON) 20 MEQ tablet Take 1 tablet (20 mEq total) by mouth every other day. Take every other day with Lasix. 15 tablet 11  . Tiotropium Bromide Monohydrate (SPIRIVA RESPIMAT) 2.5 MCG/ACT AERS Inhale 2 puffs into the lungs daily. 1 Inhaler 5  . tiZANidine (ZANAFLEX) 2 MG tablet Take 1 tablet (2  mg total) by mouth every 6 (six) hours as needed for muscle spasms. 60 tablet 0  . furosemide (LASIX) 20 MG tablet Take 1 tablet (20 mg total) by mouth daily. 30 tablet 11   No facility-administered medications prior to visit.      Allergies:   Review of patient's allergies indicates no known allergies.   Social History   Social History  . Marital status: Married    Spouse name: N/A  . Number of children: N/A  . Years of education: N/A   Social History Main Topics  . Smoking status: Former Smoker    Packs/day: 1.00    Years: 42.00    Types: Cigarettes    Quit date: 12/21/1993  . Smokeless tobacco: Never Used  . Alcohol use No  . Drug use: No  . Sexual activity: No   Other Topics Concern  . None   Social History Narrative  . None     Family History:  The patient's family history includes Heart disease in his father, mother, and sister.   ROS:   Please see the history of present illness.    Review of Systems  Constitution: Negative.  HENT: Negative.   Cardiovascular: Positive for dyspnea on exertion.  Respiratory: Negative.   Endocrine: Negative.   Hematologic/Lymphatic: Negative.   Musculoskeletal: Negative.   Gastrointestinal: Negative.   Genitourinary: Negative.   Neurological: Negative.    All other systems reviewed and are negative.   PHYSICAL EXAM:   VS:  BP 126/76   Pulse (!) 44   Ht 5\' 11"  (1.803 m)   Wt 158 lb 1.9 oz (71.7 kg)   SpO2 96%   BMI 22.05 kg/m   Physical Exam  GEN: Well nourished, well developed, in no acute distress  Neck: Slight increase JVD, no carotid bruits, or masses Cardiac:RRR; at 50 bpm with 2/6 systolic murmur at the left sternal border, no rubs, or gallops  Respiratory:  clear to auscultation bilaterally, normal work of breathing GI: soft, nontender, nondistended, + BS Ext: without cyanosis, clubbing, or edema, Good distal pulses bilaterally MS: no deformity or atrophy  Skin: warm and dry, no rash Psych: euthymic mood,  full affect  Wt Readings from Last 3 Encounters:  04/16/16 158 lb 1.9 oz (71.7 kg)  03/29/16 159 lb (72.1 kg)  03/26/16 156 lb 1.9 oz (70.8 kg)      Studies/Labs Reviewed:   EKG:  EKG is not ordered today.   Recent Labs: 03/15/2016: Hemoglobin 9.8; Platelets 212 03/26/2016: Brain Natriuretic Peptide 423.5; BUN 27; Creat 1.52; Potassium 4.1; Sodium 137; TSH 0.18   Lipid Panel    Component Value Date/Time   CHOL 122 10/10/2013 0340   TRIG 34 10/10/2013 0340   HDL 69 10/10/2013 0340   CHOLHDL 1.8 10/10/2013 0340   VLDL  7 10/10/2013 0340   LDLCALC 46 10/10/2013 0340    Additional studies/ records that were reviewed today include:   2-D echo 09/23/15 Study Conclusions   - Left ventricle: The cavity size was normal. There was mild   concentric hypertrophy. Systolic function was mildly reduced. The   estimated ejection fraction was in the range of 45% to 50%. Wall   motion was normal; there were no regional wall motion   abnormalities. - Aortic valve: There was trivial regurgitation. - Mitral valve: There was moderate regurgitation. - Right ventricle: The cavity size was mildly dilated. Wall   thickness was normal. - Pulmonic valve: There was trivial regurgitation. - Pulmonary arteries: PA peak pressure: 58 mm Hg (S).   Impressions:   - Mildly reduced LVF with EF 45%, moderate MR, mildly thickened MV   leaflets, mildly thickened AV leaflets, moderate pulmonary HTN.   Compared to prior study, PASP has increased from 69mmHg to   71mmHg. The right ventricular systolic pressure was increased   consistent with moderate pulmonary hypertension.   Catheterization 10/05/15 Conclusion   Mid RCA to Dist RCA lesion, 10% stenosed.   No significant coronary obstructive disease with only minimal 10% luminal irregularity of the mid RCA proximal to the region of the acute margin.   Probable nonischemic cardiopathy with previous noninvasive assessment of LV function at 40-45% ejection  fraction.  LVEDP today was 26 mm Hg.   RECOMMENDATION: No significant coronary obstructive disease was demonstrated.  The patient will be given preoperative clearance for his knee surgery.  Eliquis will be reinstituted tomorrow for his chronic anticoagulation and will need to be held 48 hours prior to his surgical procedure.  Indications  Abnormal nuclear stress test [R94.39 (ICD-10-CM)]      ASSESSMENT:    1. CKD (chronic kidney disease), stage III   2. Chronic systolic CHF (congestive heart failure) (HCC)   3. Paroxysmal atrial fibrillation ([place])   4. Essential hypertension   5. Congestive dilated cardiomyopathy ([place])   6. Gynecomastia      PLAN:  In order of problems listed above:  CKD stage III we'll recheck renal function today. Switching diuretics. Weight is stable. Edema is down. Chronic systolic heart failure pretty well compensated. He's had gynecomastia which he thinks is coming from Lasix. We'll try changing him to torsemide to see if this makes a difference. It also has a potential to cause gynecomastia but usually in conjunction with allopurinol which he does not take.  PAF has been maintaining normal sinus rhythm on Eliquis. No rate lowering drugs because of bradycardia  Essential hypertension controlled  Congestive dilated cardiomyopathy compensated  Gynecomastia has been bothering him. Trying to change Lasix to torsemide to see if that makes a difference. Follow-up with Dr. Radford Pax    Medication Adjustments/Labs and Tests Ordered: Current medicines are reviewed at length with the patient today.  Concerns regarding medicines are outlined above.  Medication changes, Labs and Tests ordered today are listed in the Patient Instructions below. Patient Instructions  Medication Instructions:  Your physician has recommended you make the following change in your medication:  1. Discontinue Lasix 2. Start Torsemide (10 mg ) one half tablet daily   Labwork: Your  physician recommends that you have lab work today: bmet   Testing/Procedures: -None  Follow-Up: Your physician recommends that you keep your scheduled  follow-up appointment with Dr. Radford Pax.    Any Other Special Instructions Will Be Listed Below (If Applicable).     If you  need a refill on your cardiac medications before your next appointment, please call your pharmacy.      Sumner Boast, PA-C  04/16/2016 10:52 AM    [facility] [address], [place], [place]  [address] Phone: [phone]; Fax: [phone]

## 2016-04-16 NOTE — Telephone Encounter (Signed)
cancel test   Robert Gardner  Sent: Fri April 13, 2016 4:13 PM  To: Theodoro Parma, RN            Message   Per Danne Baxter 04-13-16 Patient (Patients wife called and stated that he does not want the sleep study completed. KB.)

## 2016-04-18 NOTE — Telephone Encounter (Signed)
Called and spoke with wife (per DPR) - patient does NOT want to have a sleep study.   I encouraged sleep study and if they changed their mind they are aware to call to reschedule.

## 2016-04-19 ENCOUNTER — Other Ambulatory Visit: Payer: Self-pay | Admitting: Cardiology

## 2016-04-24 ENCOUNTER — Encounter: Payer: Self-pay | Admitting: Internal Medicine

## 2016-04-24 ENCOUNTER — Other Ambulatory Visit (INDEPENDENT_AMBULATORY_CARE_PROVIDER_SITE_OTHER): Payer: 59

## 2016-04-24 ENCOUNTER — Ambulatory Visit (INDEPENDENT_AMBULATORY_CARE_PROVIDER_SITE_OTHER): Payer: 59 | Admitting: Internal Medicine

## 2016-04-24 VITALS — BP 120/74 | HR 54 | Ht 71.0 in | Wt 156.4 lb

## 2016-04-24 DIAGNOSIS — R0689 Other abnormalities of breathing: Secondary | ICD-10-CM

## 2016-04-24 DIAGNOSIS — R5383 Other fatigue: Secondary | ICD-10-CM

## 2016-04-24 DIAGNOSIS — Z87891 Personal history of nicotine dependence: Secondary | ICD-10-CM

## 2016-04-24 DIAGNOSIS — J431 Panlobular emphysema: Secondary | ICD-10-CM

## 2016-04-24 DIAGNOSIS — R0600 Dyspnea, unspecified: Secondary | ICD-10-CM

## 2016-04-24 DIAGNOSIS — R5381 Other malaise: Secondary | ICD-10-CM | POA: Diagnosis not present

## 2016-04-24 DIAGNOSIS — N189 Chronic kidney disease, unspecified: Secondary | ICD-10-CM

## 2016-04-24 DIAGNOSIS — D62 Acute posthemorrhagic anemia: Secondary | ICD-10-CM

## 2016-04-24 LAB — CBC WITH DIFFERENTIAL/PLATELET
BASOS PCT: 0.6 % (ref 0.0–3.0)
Basophils Absolute: 0 10*3/uL (ref 0.0–0.1)
EOS PCT: 1.2 % (ref 0.0–5.0)
Eosinophils Absolute: 0.1 10*3/uL (ref 0.0–0.7)
HEMATOCRIT: 34.4 % — AB (ref 39.0–52.0)
HEMOGLOBIN: 11.6 g/dL — AB (ref 13.0–17.0)
LYMPHS PCT: 30.9 % (ref 12.0–46.0)
Lymphs Abs: 1.4 10*3/uL (ref 0.7–4.0)
MCHC: 33.7 g/dL (ref 30.0–36.0)
MCV: 92.4 fl (ref 78.0–100.0)
MONOS PCT: 12.3 % — AB (ref 3.0–12.0)
Monocytes Absolute: 0.6 10*3/uL (ref 0.1–1.0)
Neutro Abs: 2.5 10*3/uL (ref 1.4–7.7)
Neutrophils Relative %: 55 % (ref 43.0–77.0)
Platelets: 225 10*3/uL (ref 150.0–400.0)
RBC: 3.72 Mil/uL — AB (ref 4.22–5.81)
RDW: 15.2 % (ref 11.5–15.5)
WBC: 4.6 10*3/uL (ref 4.0–10.5)

## 2016-04-24 NOTE — Progress Notes (Signed)
Subjective:     Patient ID: Robert Gardner, male   DOB: 1941-06-04, 75 y.o.   MRN: [id number]  HPI 26-year-old male former smoker followed for Moderate COPD/Emphysema  Combined diastolic systolic congestive heart failure Atrial  fibrillation on Eliquis  TEST  11/18/15:  Full pulmonary function test 11/18/2015 shows FVC 2.23 L/58%. FEV1 1.82 L/64% with a ratio of 82. There is no broncho-dilator response. DLCO is 13.4/41% and significantly reduced. Overall restriction with low DLCO. 01/11/2016: Walking desaturation test: [name] from 99% to 94%.. Stop at the end of 2 laps due to lightheadedness. This was on room air. He did not complete his third lap.  01/11/2016: CBC shows hemoglobin of 10.3 g percent and this is improved since postoperative blood loss in May 2017.  01/13/2016: High-resolution CT chest personally visualized negative findings. He has bilateral apical emphysema associated with air trapping. He also has evidence of pulmonary hypertension. There is no fibrotic lung disease or nodules. Previously on glass opacities have resolved.  03/29/2016 acute office visit Patient returns for work in visit. Patient was seen in July for COPD. Patient was found to have emphysema on his high resolution CT chest with no evidence of interstitial lung disease. PFT showed restrictive lung disease with an FEV1 at 64% and a ratio of 82. DLCO was decreased at 41%. Walk test w/ no desats in office.  Patient was started on Incruse . Patient says that shortly after starting this inhaler that he noticed that he had enlarged right breast tissue. Patient was seen by his primary care physician and set up for a mammogram.  This was done on 03/01/2016 that showed no concerning masses. Felt to have right gynecomastia. Medication review was done, denies use of Aldactone or finasteride.. Explained to him and his wife that it would be uncommon for increased to have this particular side effect. She is on a calcium channel  blocker with amlodipine, for a long time. Calcium channel blockers can cause gynecomastia. She denies any chest pain, orthopnea, PND, or increased leg swelling. He declines a flu shot.   OV 04/24/2016  Chief Complaint  Patient presents with  . Follow-up    Pt states since seeing TP on 10.5.17 he does not feel back to baseline. Pt c/o increase in SOB, dry cough, right sided chest discomfort. Pt denies f/c/s.     Follow-up moderate COPD with chronic systolic and diastolic heart failure  I personally saw him in July 2017 at which time dyspnea is been deemed due to COPD, anemia and posthospitalization fatigue and deconditioning and chronic systolic and diastolic heart failure. I put him on long-acting anticholinergic manufactured by [employer]. Sometime after that he was diagnosed with gynecomastia so he wanted to change even though this was not the causative agent. He was started on Spiriva one follow-up visit earlier this month which he says is only helping somewhat. He still has significant fatigue and deconditioning. He is only somewhat improved since his hospitalization earlier this year.Review of the labs show that he is still anemic and as of last week his creatinine is suddenly increased 1.8 mg percent. He feels is not nervous situation where he can work anymore. He is not having any active bleeding    has a past medical history of Arthritis; Atrial flutter ([place]); Bell palsy; Cancer [facility]); CHF (congestive heart failure) ([place]); CKD (chronic kidney disease), stage II; Combined systolic and diastolic heart failure ([place]); Erectile dysfunction; Hypertension; Mitral regurgitation; Mobitz (type) I (Wenckebach's) atrioventricular block; Normocytic anemia;  Second degree AV block, Mobitz type I (09/08/2015); Shortness of breath dyspnea; and Thyroid enlargement.   reports that he quit smoking about 22 years ago. His smoking use included Cigarettes. He has a 42.00 pack-year smoking history. He  has never used smokeless tobacco.  Past Surgical History:  Procedure Laterality Date  . ABLATION  11-11-2013   CTI ablation by Dr Lovena Le  . ATRIAL FLUTTER ABLATION N/A 11/11/2013   Procedure: ATRIAL FLUTTER ABLATION;  Surgeon: Evans Lance, MD;  Location: [facility] CATH LAB;  Service: Cardiovascular;  Laterality: N/A;  . CARDIAC CATHETERIZATION N/A 10/05/2015   Procedure: Left Heart Cath and Coronary Angiography;  Surgeon: Troy Sine, MD;  Location: [place] CV LAB;  Service: Cardiovascular;  Laterality: N/A;  . CYST REMOVAL TRUNK     like a cyst  . Rectal tear     repair  . Resection of melanoma Right    Leg  . TOTAL KNEE ARTHROPLASTY Right 10/31/2015   Procedure: RIGHT TOTAL KNEE ARTHROPLASTY;  Surgeon: Frederik Pear, MD;  Location: [place];  Service: Orthopedics;  Laterality: Right;    No Known Allergies  Immunization History  Administered Date(s) Administered  . Pneumococcal Polysaccharide-23 01/11/2015    Family History  Problem Relation Age of Onset  . Heart disease Mother     unclear details  . Heart disease Father     unclear details  . Heart disease Sister     unclear details     Current Outpatient Prescriptions:  .  albuterol (PROVENTIL HFA;VENTOLIN HFA) 108 (90 Base) MCG/ACT inhaler, Inhale 2 puffs into the lungs every 6 (six) hours as needed for wheezing or shortness of breath., Disp: 1 Inhaler, Rfl: 5 .  amLODipine (NORVASC) 5 MG tablet, TAKE ONE TABLET BY MOUTH ONCE DAILY, Disp: 180 tablet, Rfl: 3 .  ELIQUIS 2.5 MG TABS tablet, TAKE ONE TABLET BY MOUTH TWICE DAILY, Disp: 180 tablet, Rfl: 3 .  Multiple Vitamin (MULTIVITAMIN) capsule, Take 1 capsule by mouth daily., Disp: , Rfl:  .  potassium chloride SA (K-DUR,KLOR-CON) 20 MEQ tablet, Take 1 tablet (20 mEq total) by mouth every other day. Take every other day with Lasix., Disp: 15 tablet, Rfl: 11 .  Tiotropium Bromide Monohydrate (SPIRIVA RESPIMAT) 2.5 MCG/ACT AERS, Inhale 2 puffs into the lungs daily., Disp: 1  Inhaler, Rfl: 5 .  tiZANidine (ZANAFLEX) 2 MG tablet, Take 1 tablet (2 mg total) by mouth every 6 (six) hours as needed for muscle spasms., Disp: 60 tablet, Rfl: 0 .  torsemide (DEMADEX) 20 MG tablet, Take 0.5 tablets (10 mg total) by mouth daily., Disp: 30 tablet, Rfl: 9   Review of Systems     Objective:   Physical Exam  Constitutional: He is oriented to person, place, and time. He appears well-developed and well-nourished. No distress.  Frail and cachectic  HENT:  Head: Normocephalic and atraumatic.  Right Ear: External ear normal.  Left Ear: External ear normal.  Mouth/Throat: Oropharynx is clear and moist. No oropharyngeal exudate.  Eyes: Conjunctivae and EOM are normal. Pupils are equal, round, and reactive to light. Right eye exhibits no discharge. Left eye exhibits no discharge. No scleral icterus.  Neck: Normal range of motion. Neck supple. No JVD present. No tracheal deviation present. No thyromegaly present.  Cardiovascular: Normal rate, regular rhythm and intact distal pulses.  Exam reveals no gallop and no friction rub.   No murmur heard. Pulmonary/Chest: Effort normal and breath sounds normal. No respiratory distress. He has no wheezes. He has  no rales. He exhibits no tenderness.  Abdominal: Soft. Bowel sounds are normal. He exhibits no distension and no mass. There is no tenderness. There is no rebound and no guarding.  Musculoskeletal: Normal range of motion. He exhibits no edema or tenderness.  Lymphadenopathy:    He has no cervical adenopathy.  Neurological: He is alert and oriented to person, place, and time. He has normal reflexes. No cranial nerve deficit. Coordination normal.  Skin: Skin is warm and dry. No rash noted. He is not diaphoretic. No erythema. No pallor.  Psychiatric: He has a normal mood and affect. His behavior is normal. Judgment and thought content normal.  Nursing note reviewed.  Vitals:   04/24/16 1708  BP: 120/74  Pulse: (!) 54  SpO2: 98%   Weight: 156 lb 6.4 oz (70.9 kg)  Height: 5\' 11"  (1.803 m)    Estimated body mass index is 21.81 kg/m as calculated from the following:   Height as of this encounter: 5\' 11"  (1.803 m).   Weight as of this encounter: 156 lb 6.4 oz (70.9 kg).     Assessment:       ICD-9-CM ICD-10-CM   1. Dyspnea and respiratory abnormality 786.09 R06.00 CBC w/Diff    [id number] Basic Metabolic Panel (BMET)     Vitamin D 1,25 dihydroxy     AMB referral to rehabilitation  2. Physical deconditioning 799.3 R53.81 CBC w/Diff     Basic Metabolic Panel (BMET)     Vitamin D 1,25 dihydroxy     AMB referral to rehabilitation  3. Acute posthemorrhagic anemia 285.1 D62 CBC w/Diff     Basic Metabolic Panel (BMET)     Vitamin D 1,25 dihydroxy  4. Panlobular emphysema (HCC) 492.8 J43.1 CBC w/Diff     Basic Metabolic Panel (BMET)     Vitamin D 1,25 dihydroxy     AMB referral to rehabilitation  5. History of smoking 25-50 pack years V15.82 Z87.891 CBC w/Diff     Basic Metabolic Panel (BMET)     Vitamin D 1,25 dihydroxy  6. Chronic kidney insufficiency, unspecified stage 585.9 N18.9 CBC w/Diff     Basic Metabolic Panel (BMET)     Vitamin D 1,25 dihydroxy  7. Fatigue, unspecified type 780.79 R53.83 CBC w/Diff     Basic Metabolic Panel (BMET)     Vitamin D 1,25 dihydroxy     AMB referral to rehabilitation        Plan:       his COPD stable. His symptoms are multifactorial. At this point in time I'll try to optimize his COPD by stepping up his therapy. We will change his mono agent anticholinergic therapy to dual agent anticholinergic with long-acting beta agonist (BEVESPI)  . We will assess his vitamin D levels, renal function and anemia for his fatigue and symptoms. We will refer him to pulmonary rehabilitation. At the levels stay off work for the next 3 months at least before we can make a determination about permanent disability    and his wife are in agreement with the plan    > 50% of this > 40 min  visit spent in face to face counseling or/and coordination of care   Dr. Brand Males, M.D., [facility].C.P Pulmonary and Critical Care Medicine Staff Physician [place] [facility] Pager: [phone], If no answer or between  15:00h - 7:00h: call [phone]  04/24/2016 5:44 PM

## 2016-04-24 NOTE — Patient Instructions (Addendum)
ICD-9-CM ICD-10-CM   1. Dyspnea and respiratory abnormality 786.09 R06.00     R06.89   2. Physical deconditioning 799.3 R53.81   3. Acute posthemorrhagic anemia 285.1 D62   4. Panlobular emphysema (HCC) 492.8 J43.1   5. History of smoking 25-50 pack years V15.82 Z87.891   6. Chronic kidney insufficiency, unspecified stage 585.9 N18.9   7. Fatigue, unspecified type 780.79 R53.83    Do cbc with dif Do bmet Refer rehab Hold off on work for another 3 months atleast Stop spiriva Start bevespi 2 puff twice daily Use albuterol prn  followup 4-6 weeks for response  - copd cat score at fu

## 2016-04-25 ENCOUNTER — Telehealth: Payer: Self-pay | Admitting: *Deleted

## 2016-04-25 DIAGNOSIS — I10 Essential (primary) hypertension: Secondary | ICD-10-CM

## 2016-04-25 LAB — BASIC METABOLIC PANEL
BUN: 21 mg/dL (ref 6–23)
CALCIUM: 9.5 mg/dL (ref 8.4–10.5)
CO2: 27 meq/L (ref 19–32)
Chloride: 104 mEq/L (ref 96–112)
Creatinine, Ser: 1.67 mg/dL — ABNORMAL HIGH (ref 0.40–1.50)
GFR: 52.06 mL/min — AB (ref >60.00)
GLUCOSE: 97 mg/dL (ref 70–99)
Potassium: 3.9 mEq/L (ref 3.5–5.1)
Sodium: 136 mEq/L (ref 135–145)

## 2016-04-25 NOTE — Telephone Encounter (Signed)
Pt wife, dpr on file has been made aware of pts lab results and that we need a repeat in 2 weeks (05-07-16), She is agreeable with this and will have pt at lab on the date above. Order in [facility]

## 2016-04-25 NOTE — Telephone Encounter (Signed)
-----   Message from Imogene Burn, PA-C sent at 04/18/2016  7:53 AM EDT ----- Recheck bmet in 2 weeks

## 2016-04-25 NOTE — Progress Notes (Signed)
Called and spoke to pt. Informed him of the results and recs per MR. Pt verbalized understanding and denied any further questions or concerns at this time.  

## 2016-04-26 ENCOUNTER — Telehealth: Payer: Self-pay | Admitting: Internal Medicine

## 2016-04-26 DIAGNOSIS — J439 Emphysema, unspecified: Secondary | ICD-10-CM

## 2016-04-26 LAB — VITAMIN D 1,25 DIHYDROXY
VITAMIN D 1, 25 (OH) TOTAL: 21 pg/mL (ref 18–72)
VITAMIN D3 1, 25 (OH): 21 pg/mL

## 2016-04-26 NOTE — Telephone Encounter (Signed)
Order has been fixed. Nothing further was needed. 

## 2016-04-27 NOTE — Progress Notes (Signed)
Called and spoke to pt. Informed pt of the results and recs per MR. Pt verbalized understanding and denied any further questions or concerns at this time.  

## 2016-05-07 ENCOUNTER — Other Ambulatory Visit: Payer: 59

## 2016-05-24 ENCOUNTER — Encounter (HOSPITAL_BASED_OUTPATIENT_CLINIC_OR_DEPARTMENT_OTHER): Payer: 59

## 2016-05-29 ENCOUNTER — Telehealth: Payer: Self-pay | Admitting: Internal Medicine

## 2016-05-29 DIAGNOSIS — I272 Pulmonary hypertension, unspecified: Secondary | ICD-10-CM

## 2016-05-29 DIAGNOSIS — J431 Panlobular emphysema: Secondary | ICD-10-CM

## 2016-05-29 NOTE — Telephone Encounter (Signed)
pls see my office note July 2017 - he did not desaturate but was fatigued. He might need to come in for qualifying sats for portable o2  Dr. Brand Males, M.D., [facility].C.P Pulmonary and Critical Care Medicine Staff Physician [place] [facility] Pager: [phone], If no answer or between  15:00h - 7:00h: call [phone]  05/29/2016 12:42 PM

## 2016-05-29 NOTE — Telephone Encounter (Signed)
MR  Please advise  This pt. Called to see if he can have an order placed for a portable oxygen tank to be able to take with him when he goes out of town 12/18/217.

## 2016-05-29 NOTE — Telephone Encounter (Signed)
Attempted to contact pt. No answer, no option to leave a message. Will try back.  

## 2016-05-30 ENCOUNTER — Telehealth: Payer: Self-pay | Admitting: Internal Medicine

## 2016-05-30 NOTE — Telephone Encounter (Signed)
Spoke with pt's wife. Advised her that we will change the DME on the order. Nothing further was needed.

## 2016-05-30 NOTE — Telephone Encounter (Signed)
Spoke with pt. He only needs the POC to use at night time. Pt currently uses 2.5L qhs only. Order has been placed. Nothing further was needed.

## 2016-06-06 ENCOUNTER — Telehealth: Payer: Self-pay | Admitting: Internal Medicine

## 2016-06-06 NOTE — Telephone Encounter (Signed)
MR  Please Advise  Pt. Wife called and stated that [employer] does not offer the POC that he is requesting. Below is the is the order that was placed.   Randa Spike, CMA      8:30 AM  Note    Spoke with pt. He only needs the POC to use at night time. Pt currently uses 2.5L qhs only. Order has been placed. Nothing further was needed.

## 2016-06-07 NOTE — Telephone Encounter (Signed)
LMTCB

## 2016-06-07 NOTE — Telephone Encounter (Signed)
POC is only for ambulation and not for night. If he is wanting for night use then yes the o2 company will not give it. However, if he wants it for ambulation we would need to retsst him and see if he qualifies  Dr. Brand Males, M.D., [facility].C.P Pulmonary and Critical Care Medicine Staff Physician [place] [facility] Pager: [phone], If no answer or between  15:00h - 7:00h: call [phone]  06/07/2016 3:43 PM

## 2016-06-11 NOTE — Telephone Encounter (Signed)
lmtcb x2 for pt. 

## 2016-06-13 NOTE — Telephone Encounter (Signed)
lmomtcb x 3  

## 2016-06-14 NOTE — Telephone Encounter (Signed)
ATC, no answer. Per initial message left (06/06/16) patient stated that she was going out of town so she may be out of town already. Will leave in triage to follow up next week after the Holiday.

## 2016-06-19 NOTE — Telephone Encounter (Signed)
lmtcb for pt.  

## 2016-06-20 NOTE — Telephone Encounter (Signed)
lmtcb for pt.  

## 2016-06-28 ENCOUNTER — Encounter (HOSPITAL_COMMUNITY): Payer: Self-pay

## 2016-06-28 NOTE — Progress Notes (Signed)
Spoke with patient and wife regarding pulmonary rehab referral. Patient states he is not interested in the program at this time due to the winter temperatures. His wife was given phone number of the department and encouraged patient to call once patient is ready to begin and the weather warms.

## 2016-07-05 ENCOUNTER — Ambulatory Visit: Payer: 59 | Admitting: Cardiology

## 2016-08-13 DIAGNOSIS — I509 Heart failure, unspecified: Secondary | ICD-10-CM | POA: Diagnosis not present

## 2016-08-20 ENCOUNTER — Encounter: Payer: Self-pay | Admitting: Internal Medicine

## 2016-08-20 ENCOUNTER — Ambulatory Visit (INDEPENDENT_AMBULATORY_CARE_PROVIDER_SITE_OTHER): Payer: Medicare HMO | Admitting: Internal Medicine

## 2016-08-20 VITALS — BP 96/60 | HR 52 | Ht 71.0 in | Wt 156.0 lb

## 2016-08-20 DIAGNOSIS — J449 Chronic obstructive pulmonary disease, unspecified: Secondary | ICD-10-CM | POA: Diagnosis not present

## 2016-08-20 DIAGNOSIS — N6310 Unspecified lump in the right breast, unspecified quadrant: Secondary | ICD-10-CM | POA: Diagnosis not present

## 2016-08-20 NOTE — Progress Notes (Signed)
Subjective:     Patient ID: Robert Gardner, male   DOB: July 24, 1940, 76 y.o.   MRN: [id number]  HPI 76 year old male former smoker followed for Moderate COPD/Emphysema  Combined diastolic systolic congestive heart failure Atrial  fibrillation on Eliquis  TEST  11/18/15:  Full pulmonary function test 11/18/2015 shows FVC 2.23 L/58%. FEV1 1.82 L/64% with a ratio of 82. There is no broncho-dilator response. DLCO is 13.4/41% and significantly reduced. Overall restriction with low DLCO. 01/11/2016: Walking desaturation test: Robert Gardner from 99% to 94%.. Stop at the end of 2 laps due to lightheadedness. This was on room air. He did not complete his third lap.  01/11/2016: CBC shows hemoglobin of 10.3 g percent and this is improved since postoperative blood loss in May 2017.  01/13/2016: High-resolution CT chest personally visualized negative findings. He has bilateral apical emphysema associated with air trapping. He also has evidence of pulmonary hypertension. There is no fibrotic lung disease or nodules. Previously on glass opacities have resolved.  03/29/2016 acute office visit Patient returns for work in visit. Patient was seen in July for COPD. Patient was found to have emphysema on his high resolution CT chest with no evidence of interstitial lung disease. PFT showed restrictive lung disease with an FEV1 at 64% and a ratio of 82. DLCO was decreased at 41%. Walk test w/ no desats in office.  Patient was started on Incruse . Patient says that shortly after starting this inhaler that he noticed that he had enlarged right breast tissue. Patient was seen by his primary care physician and set up for a mammogram.  This was done on 03/01/2016 that showed no concerning masses. Felt to have right gynecomastia. Medication review was done, denies use of Aldactone or finasteride.. Explained to him and his wife that it would be uncommon for increased to have this particular side effect. She is on a calcium channel  blocker with amlodipine, for a long time. Calcium channel blockers can cause gynecomastia. She denies any chest pain, orthopnea, PND, or increased leg swelling. He declines a flu shot.   OV 04/24/2016  Chief Complaint  Patient presents with  . Follow-up    Pt states since seeing TP on 10.5.17 he does not feel back to baseline. Pt c/o increase in SOB, dry cough, right sided chest discomfort. Pt denies f/c/s.     Follow-up moderate COPD with chronic systolic and diastolic heart failure  I personally saw him in July 2017 at which time dyspnea is been deemed due to COPD, anemia and posthospitalization fatigue and deconditioning and chronic systolic and diastolic heart failure. I put him on long-acting anticholinergic manufactured by [employer]. Sometime after that he was diagnosed with gynecomastia so he wanted to change even though this was not the causative agent. He was started on Spiriva one follow-up visit earlier this month which he says is only helping somewhat. He still has significant fatigue and deconditioning. He is only somewhat improved since his hospitalization earlier this year.Review of the labs show that he is still anemic and as of last week his creatinine is suddenly increased 1.8 mg percent. He feels is not nervous situation where he can work anymore. He is not having any active bleeding   OV 08/20/2016  Chief Complaint  Patient presents with  . Follow-up    Pt states he feels he is doing well - states he has noticed an improvement since taking the bevespi. Pt c/o nonprod cough and occ midsternal chest pain.  Follow-up moderate COPD with chronic systolic and diastolic heart failure  Last seen October 2017. He presents with his wife Robert Gardner . Overall he is feeling stable. He likes [employer] dual inhaler long-acting beta agonist glycopyrrolate and long-acting inhaled steroid budesonide (BEVESPII). He is not in COPD exacerbation. His fatigue is improved. He does  have some occasional nonspecific midsternal chest pain. He has an upcoming appointment with her. He is concerned about his right breast lump. In September 2017 he had a mammogram and apparently he was reassured. However they're extremely concerned about this and is requesting a referral.   Results for Robert Gardner, Robert Gardner (MRN [id number]) as of 08/20/2016 11:33  Ref. Range 11/18/2015 15:49  FEV1-Post Latest Units: L 1.90  FEV1-%Pred-Post Latest Units: % 67  FEV1-%Change-Post Latest Units: % 4  Post FEV1/FVC ratio Latest Units: % 87  Results for Robert Gardner, Robert Gardner (MRN [id number]) as of 08/20/2016 11:33  Ref. Range 11/18/2015 15:49  DLCO cor Latest Units: ml/min/mmHg 13.42  DLCO cor % pred Latest Units: % 41     Results for Robert Gardner, Robert Gardner (MRN [id number]) as of 08/20/2016 11:33  Ref. Range 01/11/2016 12:16 03/15/2016 15:07 03/26/2016 11:41 04/16/2016 10:54 04/24/2016 17:36  Creatinine Latest Ref Range: 0.40 - 1.50 mg/dL  1.49 (H) 1.52 (H) 1.82 (H) 1.67 (H)     has a past medical history of Arthritis; Atrial flutter ([place]); Bell palsy; Cancer [facility]); CHF (congestive heart failure) ([place]); CKD (chronic kidney disease), stage II; Combined systolic and diastolic heart failure ([place]); Erectile dysfunction; Hypertension; Mitral regurgitation; Mobitz (type) I (Wenckebach's) atrioventricular block; Normocytic anemia; Second degree AV block, Mobitz type I (09/08/2015); Shortness of breath dyspnea; and Thyroid enlargement.   reports that he quit smoking about 22 years ago. His smoking use included Cigarettes. He has a 42.00 pack-year smoking history. He has never used smokeless tobacco.  Past Surgical History:  Procedure Laterality Date  . ABLATION  11-11-2013   CTI ablation by Dr Lovena Le  . ATRIAL FLUTTER ABLATION N/A 11/11/2013   Procedure: ATRIAL FLUTTER ABLATION;  Surgeon: Evans Lance, MD;  Location: [facility] CATH LAB;  Service: Cardiovascular;  Laterality: N/A;  . CARDIAC CATHETERIZATION N/A 10/05/2015   Procedure: Left  Heart Cath and Coronary Angiography;  Surgeon: Troy Sine, MD;  Location: [place] CV LAB;  Service: Cardiovascular;  Laterality: N/A;  . CYST REMOVAL TRUNK     like a cyst  . Rectal tear     repair  . Resection of melanoma Right    Leg  . TOTAL KNEE ARTHROPLASTY Right 10/31/2015   Procedure: RIGHT TOTAL KNEE ARTHROPLASTY;  Surgeon: Frederik Pear, MD;  Location: [place];  Service: Orthopedics;  Laterality: Right;    No Known Allergies  Immunization History  Administered Date(s) Administered  . Pneumococcal Polysaccharide-23 01/11/2015    Family History  Problem Relation Age of Onset  . Heart disease Mother     unclear details  . Heart disease Father     unclear details  . Heart disease Sister     unclear details     Current Outpatient Prescriptions:  .  albuterol (PROVENTIL HFA;VENTOLIN HFA) 108 (90 Base) MCG/ACT inhaler, Inhale 2 puffs into the lungs every 6 (six) hours as needed for wheezing or shortness of breath., Disp: 1 Inhaler, Rfl: 5 .  amLODipine (NORVASC) 5 MG tablet, TAKE ONE TABLET BY MOUTH ONCE DAILY, Disp: 180 tablet, Rfl: 3 .  ELIQUIS 2.5 MG TABS tablet, TAKE ONE TABLET BY MOUTH TWICE DAILY, Disp: 180 tablet, Rfl: 3 .  Glycopyrrolate-Formoterol (BEVESPI AEROSPHERE) 9-4.8 MCG/ACT AERO, Take 2 puffs by mouth 2 (two) times daily., Disp: , Rfl:  .  Multiple Vitamin (MULTIVITAMIN) capsule, Take 1 capsule by mouth daily., Disp: , Rfl:  .  potassium chloride SA (K-DUR,KLOR-CON) 20 MEQ tablet, Take 1 tablet (20 mEq total) by mouth every other day. Take every other day with Lasix., Disp: 15 tablet, Rfl: 11 .  torsemide (DEMADEX) 20 MG tablet, Take 0.5 tablets (10 mg total) by mouth daily., Disp: 30 tablet, Rfl: 9   Review of Systems     Objective:   Physical Exam  Constitutional: He is oriented to person, place, and time. He appears well-developed and well-nourished. No distress.  Body mass index is 21.76 kg/m.   HENT:  Head: Normocephalic and atraumatic.   Right Ear: External ear normal.  Left Ear: External ear normal.  Mouth/Throat: Oropharynx is clear and moist. No oropharyngeal exudate.  Eyes: Conjunctivae and EOM are normal. Pupils are equal, round, and reactive to light. Right eye exhibits no discharge. Left eye exhibits no discharge. No scleral icterus.  Neck: Normal range of motion. Neck supple. No JVD present. No tracheal deviation present. No thyromegaly present.  Cardiovascular: Normal rate, regular rhythm and intact distal pulses.  Exam reveals no gallop and no friction rub.   No murmur heard. Pulmonary/Chest: Effort normal and breath sounds normal. No respiratory distress. He has no wheezes. He has no rales. He exhibits no tenderness.  Definite lump in the right breast that is movable and firm? Lipoma  Abdominal: Soft. Bowel sounds are normal. He exhibits no distension and no mass. There is no tenderness. There is no rebound and no guarding.  Musculoskeletal: Normal range of motion. He exhibits no edema or tenderness.  Lymphadenopathy:    He has no cervical adenopathy.  Neurological: He is alert and oriented to person, place, and time. He has normal reflexes. No cranial nerve deficit. Coordination normal.  Skin: Skin is warm and dry. No rash noted. He is not diaphoretic. No erythema. No pallor.  Psychiatric: He has a normal mood and affect. His behavior is normal. Judgment and thought content normal.  Nursing note and vitals reviewed.   Vitals:   08/20/16 1113  BP: 96/60  Pulse: (!) 52  SpO2: 98%  Weight: 156 lb (70.8 kg)  Height: 5\' 11"  (1.803 m)        Assessment:       ICD-9-CM ICD-10-CM   1. COPD, moderate ([place]) 496 J44.9   2. Breast mass, right 611.72 N63.10        Plan:      Stable copd Continue bevespi 2 puff twice daily; take samples but also price it so we can do appeals if needed Use albuterol as needed Glad you are seeing cardiology soon  Refer [facility] surgery for right breast  lump  Followup 3 months or sooner if needed  Dr. Brand Males, M.D., [facility].C.P Pulmonary and Critical Care Medicine Staff Physician [place] [facility] Pager: [phone], If no answer or between  15:00h - 7:00h: call [phone]  08/20/2016 11:42 AM

## 2016-08-20 NOTE — Patient Instructions (Addendum)
ICD-9-CM ICD-10-CM   1. COPD, moderate ([place]) 496 J44.9   2. Breast mass, right 611.72 N63.10     Stable copd Continue bevespi 2 puff twice daily; take samples but also price it so we can do appeals if needed Use albuterol as needed Glad you are seeing cardiology soon  Refer [facility] surgery for right breast lump  Followup 3 months or sooner if needed

## 2016-08-28 ENCOUNTER — Other Ambulatory Visit: Payer: Self-pay | Admitting: Surgery

## 2016-08-28 DIAGNOSIS — N62 Hypertrophy of breast: Secondary | ICD-10-CM | POA: Diagnosis not present

## 2016-09-06 ENCOUNTER — Encounter: Payer: Self-pay | Admitting: Cardiology

## 2016-09-06 ENCOUNTER — Ambulatory Visit (INDEPENDENT_AMBULATORY_CARE_PROVIDER_SITE_OTHER): Payer: Medicare HMO | Admitting: Cardiology

## 2016-09-06 VITALS — BP 132/68 | HR 62 | Ht 71.0 in | Wt 156.1 lb

## 2016-09-06 DIAGNOSIS — I5042 Chronic combined systolic (congestive) and diastolic (congestive) heart failure: Secondary | ICD-10-CM | POA: Diagnosis not present

## 2016-09-06 DIAGNOSIS — I481 Persistent atrial fibrillation: Secondary | ICD-10-CM | POA: Diagnosis not present

## 2016-09-06 DIAGNOSIS — I110 Hypertensive heart disease with heart failure: Secondary | ICD-10-CM | POA: Diagnosis not present

## 2016-09-06 DIAGNOSIS — I483 Typical atrial flutter: Secondary | ICD-10-CM | POA: Diagnosis not present

## 2016-09-06 DIAGNOSIS — I4819 Other persistent atrial fibrillation: Secondary | ICD-10-CM

## 2016-09-06 DIAGNOSIS — I441 Atrioventricular block, second degree: Secondary | ICD-10-CM | POA: Diagnosis not present

## 2016-09-06 DIAGNOSIS — I340 Nonrheumatic mitral (valve) insufficiency: Secondary | ICD-10-CM | POA: Diagnosis not present

## 2016-09-06 DIAGNOSIS — I272 Pulmonary hypertension, unspecified: Secondary | ICD-10-CM | POA: Diagnosis not present

## 2016-09-06 LAB — CBC WITH DIFFERENTIAL/PLATELET
BASOS ABS: 0 10*3/uL (ref 0.0–0.2)
Basos: 0 %
EOS (ABSOLUTE): 0.1 10*3/uL (ref 0.0–0.4)
Eos: 2 %
Hematocrit: 33.6 % — ABNORMAL LOW (ref 37.5–51.0)
Hemoglobin: 11.2 g/dL — ABNORMAL LOW (ref 13.0–17.7)
Immature Grans (Abs): 0 10*3/uL (ref 0.0–0.1)
Immature Granulocytes: 0 %
Lymphocytes Absolute: 1.5 10*3/uL (ref 0.7–3.1)
Lymphs: 32 %
MCH: 31.1 pg (ref 26.6–33.0)
MCHC: 33.3 g/dL (ref 31.5–35.7)
MCV: 93 fL (ref 79–97)
Monocytes Absolute: 0.5 10*3/uL (ref 0.1–0.9)
Monocytes: 11 %
NEUTROS PCT: 55 %
Neutrophils Absolute: 2.6 10*3/uL (ref 1.4–7.0)
PLATELETS: 222 10*3/uL (ref 150–379)
RBC: 3.6 x10E6/uL — AB (ref 4.14–5.80)
RDW: 13.7 % (ref 12.3–15.4)
WBC: 4.7 10*3/uL (ref 3.4–10.8)

## 2016-09-06 LAB — BASIC METABOLIC PANEL
BUN / CREAT RATIO: 16 (ref 10–24)
BUN: 24 mg/dL (ref 8–27)
CO2: 23 mmol/L (ref 18–29)
CREATININE: 1.51 mg/dL — AB (ref 0.76–1.27)
Calcium: 9.6 mg/dL (ref 8.6–10.2)
Chloride: 102 mmol/L (ref 96–106)
GFR calc non Af Amer: 45 mL/min/{1.73_m2} — ABNORMAL LOW (ref >59)
GFR, EST AFRICAN AMERICAN: 52 mL/min/{1.73_m2} — AB (ref >59)
Glucose: 91 mg/dL (ref 65–99)
Potassium: 4.5 mmol/L (ref 3.5–5.2)
SODIUM: 137 mmol/L (ref 134–144)

## 2016-09-06 NOTE — Patient Instructions (Signed)
Medication Instructions:  Your physician recommends that you continue on your current medications as directed. Please refer to the Current Medication list given to you today.   Labwork: TODAY: BMET, CBC  Testing/Procedures: Please reschedule your ECHO in March, 2019 to NOW.  Follow-Up: Your physician wants you to follow-up in: 6 months with Dr. Radford Pax. You will receive a reminder letter in the mail two months in advance. If you don't receive a letter, please call our office to schedule the follow-up appointment.   Any Other Special Instructions Will Be Listed Below (If Applicable).     If you need a refill on your cardiac medications before your next appointment, please call your pharmacy.

## 2016-09-06 NOTE — Progress Notes (Signed)
Cardiology Office Note    Date:  09/06/2016   ID:  Robert Gardner, DOB 12-09-1940, MRN [id number]  PCP:  Gara Kroner, MD  Cardiologist:  Fransico Him, MD   Chief Complaint  Patient presents with  . Congestive Heart Failure  . Hypertension  . Atrial Fibrillation  . Mitral Regurgitation    History of Present Illness:  Robert Gardner is a 76 y.o. male  with a history of persistent atrial fibrillation s/p aflutter ablation, chronic combined diastolic/systolic congestive heart failure, moderate mitral regurgitation, hypertension and atrial flutter. Echo 09/23/2015 showed mild LV dysfunction with EF 45-50% with mod MR, mildly dilated RV moderate pulmonary HTN PASP 15mmHg.He presents today for followup.   He has chronic DOE that is stable until he walks a long distance. He says he thinks his SOB has actually gotten better.  He has not had any chest pain or pressure and denies any  PND, orthopnea.  He denies any palpitation or syncope.  He denies any LE edema, dizziness or syncope.  He walks for exercise.        Past Medical History:  Diagnosis Date  . Arthritis   . Atrial flutter ([place])    s/p flutter ablation  . Bell palsy   . Cancer ([place])    skin cancer right leg-melanoma  . CHF (congestive heart failure) ([place])   . CKD (chronic kidney disease), stage II   . Combined systolic and diastolic heart failure ([place])    EF 45% by echo 08/2014.  . Erectile dysfunction   . Hypertension   . Mitral regurgitation    Moderate by echo 08/2014  . Mobitz (type) I (Wenckebach's) atrioventricular block    a. By Holter 10/2012.  Marland Kitchen Normocytic anemia   . Second degree AV block, Mobitz type I 09/08/2015  . Shortness of breath dyspnea    with exertion   . Thyroid enlargement    a. Abnl xray 09/2013 - possible R thyroid enlargement.     Past Surgical History:  Procedure Laterality Date  . ABLATION  11-11-2013   CTI ablation by Dr Lovena Le  . ATRIAL FLUTTER ABLATION N/A 11/11/2013   Procedure: ATRIAL  FLUTTER ABLATION;  Surgeon: Evans Lance, MD;  Location: [facility] C[facility] LAB;  Service: Cardiovascular;  Laterality: N/A;  . CARDIAC CATHETERIZATION N/A 10/05/2015   Procedure: Left Heart Cath and Coronary Angiography;  Surgeon: Troy Sine, MD;  Location: [place] CV LAB;  Service: Cardiovascular;  Laterality: N/A;  . CYST REMOVAL TRUNK     like a cyst  . Rectal tear     repair  . Resection of melanoma Right    Leg  . TOTAL KNEE ARTHROPLASTY Right 10/31/2015   Procedure: RIGHT TOTAL KNEE ARTHROPLASTY;  Surgeon: Frederik Pear, MD;  Location: [place];  Service: Orthopedics;  Laterality: Right;    Current Medications: Current Meds  Medication Sig  . albuterol (PROVENTIL HFA;VENTOLIN HFA) 108 (90 Base) MCG/ACT inhaler Inhale 2 puffs into the lungs every 6 (six) hours as needed for wheezing or shortness of breath.  Marland Kitchen amLODipine (NORVASC) 5 MG tablet TAKE ONE TABLET BY MOUTH ONCE DAILY  . ELIQUIS 2.5 MG TABS tablet TAKE ONE TABLET BY MOUTH TWICE DAILY  . Multiple Vitamin (MULTIVITAMIN) capsule Take 1 capsule by mouth daily.  . potassium chloride SA (K-DUR,KLOR-CON) 20 MEQ tablet Take 1 tablet (20 mEq total) by mouth every other day. Take every other day with Lasix.    Allergies:   Patient has no known allergies.  Social History   Social History  . Marital status: Married    Spouse name: N/A  . Number of children: N/A  . Years of education: N/A   Social History Main Topics  . Smoking status: Former Smoker    Packs/day: 1.00    Years: 42.00    Types: Cigarettes    Quit date: 12/21/1993  . Smokeless tobacco: Never Used  . Alcohol use No  . Drug use: No  . Sexual activity: No   Other Topics Concern  . None   Social History Narrative  . None     Family History:  The patient's family history includes Heart disease in his father, mother, and sister.   ROS:   Please see the history of present illness.    ROS All other systems reviewed and are negative.  No flowsheet data  found.     PHYSICAL EXAM:   VS:  BP 132/68   Pulse 62   Ht 5\' 11"  (1.803 m)   Wt 156 lb 1.9 oz (70.8 kg)   BMI 21.77 kg/m    GEN: Well nourished, well developed, in no acute distress  HEENT: normal  Neck: no JVD, carotid bruits, or masses Cardiac: RRR; no murmurs, rubs, or gallops,no edema.  Intact distal pulses bilaterally.  Respiratory:  clear to auscultation bilaterally, normal work of breathing GI: soft, nontender, nondistended, + BS MS: no deformity or atrophy  Skin: warm and dry, no rash Neuro:  Alert and Oriented x 3, Strength and sensation are intact Psych: euthymic mood, full affect  Wt Readings from Last 3 Encounters:  09/06/16 156 lb 1.9 oz (70.8 kg)  08/20/16 156 lb (70.8 kg)  04/24/16 156 lb 6.4 oz (70.9 kg)      Studies/Labs Reviewed:   EKG:  EKG is ordered today and shows NSR with second degree type I AV block  Recent Labs: 03/26/2016: Brain Natriuretic Peptide 423.5; TSH 0.18 04/24/2016: BUN 21; Creatinine, Ser 1.67; Hemoglobin 11.6; Platelets 225.0; Potassium 3.9; Sodium 136   Lipid Panel    Component Value Date/Time   CHOL 122 10/10/2013 0340   TRIG 34 10/10/2013 0340   HDL 69 10/10/2013 0340   CHOLHDL 1.8 10/10/2013 0340   VLDL 7 10/10/2013 0340   LDLCALC 46 10/10/2013 0340    Additional studies/ records that were reviewed today include:  none    ASSESSMENT:    1. Persistent atrial fibrillation ([place])   2. Chronic combined systolic and diastolic heart failure ([place])   3. Mitral valve insufficiency, unspecified etiology   4. Hypertensive heart failure ([place])   5. Typical atrial flutter ([facility])   6. Pulmonary hypertension   7. Second degree AV block, Mobitz type I      PLAN:  In order of problems listed above:  1.   Persistent atrial Fibrillation - he is maintaining NSR.  He will continue on Xarelto.  Check BMET and CBC. 1. Chronic combined systolic/diastolic CHF- he appears euvolemic on exam and weight is stable.  He will continue on  Demadex. 2. Moderate MR by echo 08/2015 -  Will repeat echo to ensure that MR has not worsened.  3. Hypertensive heart disease with CHF- BP controlled today on current meds.  He will continue amlodipine 4. Typical atrial flutter s/p ablation with no reoccurence.  He will continue Xarelto for CHADS2VASC score of 4.  5. Moderate pulmonary HTN PASP 91mmHg by echo 08/2015 - PFTs for workup of this showed possible COPD and he is followed by  pulmonary.  Chest CT showed no evidence of PE.   He had an overnight pulse ox done by Dr. Chase Caller and is on nocturnal oxygen.  I will get a sleep study to rule out sleep apnea.  Will repeat echo to ensure that pulmonary HTN has not progressed. He was supposed to get a repeat sleep study but patient denied.   6. Type I second degree AV block - he is asymptomatic with HR in the 60's    Medication Adjustments/Labs and Tests Ordered: Current medicines are reviewed at length with the patient today.  Concerns regarding medicines are outlined above.  Medication changes, Labs and Tests ordered today are listed in the Patient Instructions below.  There are no Patient Instructions on file for this visit.   Signed, Fransico Him, MD  09/06/2016 8:46 AM    [address], [place], [place]  [address] Phone: [phone]; Fax: [phone]

## 2016-09-07 ENCOUNTER — Ambulatory Visit (HOSPITAL_COMMUNITY): Payer: Medicare HMO | Attending: Cardiology

## 2016-09-07 DIAGNOSIS — I272 Pulmonary hypertension, unspecified: Secondary | ICD-10-CM | POA: Insufficient documentation

## 2016-09-07 DIAGNOSIS — I081 Rheumatic disorders of both mitral and tricuspid valves: Secondary | ICD-10-CM | POA: Insufficient documentation

## 2016-09-10 DIAGNOSIS — I509 Heart failure, unspecified: Secondary | ICD-10-CM | POA: Diagnosis not present

## 2016-09-11 NOTE — Addendum Note (Signed)
Addended by: Patterson Hammersmith A on: 09/11/2016 12:31 PM   Modules accepted: Orders

## 2016-09-17 ENCOUNTER — Telehealth: Payer: Self-pay | Admitting: Internal Medicine

## 2016-09-17 NOTE — Telephone Encounter (Signed)
Rec'd faxed request for [employer] forms to be completed and med records fwd to [facility] via interoffice mail -pr

## 2016-09-20 ENCOUNTER — Encounter (HOSPITAL_COMMUNITY)
Admission: RE | Admit: 2016-09-20 | Discharge: 2016-09-20 | Disposition: A | Discharge status: Home / Self Care | Payer: Medicare HMO | Source: Ambulatory Visit | Attending: Surgery | Admitting: Surgery

## 2016-09-20 ENCOUNTER — Encounter (HOSPITAL_COMMUNITY): Payer: Self-pay

## 2016-09-20 DIAGNOSIS — I509 Heart failure, unspecified: Secondary | ICD-10-CM | POA: Diagnosis not present

## 2016-09-20 DIAGNOSIS — I4891 Unspecified atrial fibrillation: Secondary | ICD-10-CM | POA: Insufficient documentation

## 2016-09-20 DIAGNOSIS — Z01812 Encounter for preprocedural laboratory examination: Secondary | ICD-10-CM | POA: Diagnosis not present

## 2016-09-20 DIAGNOSIS — I340 Nonrheumatic mitral (valve) insufficiency: Secondary | ICD-10-CM | POA: Insufficient documentation

## 2016-09-20 DIAGNOSIS — I110 Hypertensive heart disease with heart failure: Secondary | ICD-10-CM | POA: Insufficient documentation

## 2016-09-20 LAB — CBC
HCT: 33.1 % — ABNORMAL LOW (ref 39.0–52.0)
Hemoglobin: 11 g/dL — ABNORMAL LOW (ref 13.0–17.0)
MCH: 31.3 pg (ref 26.0–34.0)
MCHC: 33.2 g/dL (ref 30.0–36.0)
MCV: 94 fL (ref 78.0–100.0)
PLATELETS: 229 10*3/uL (ref 150–400)
RBC: 3.52 MIL/uL — ABNORMAL LOW (ref 4.22–5.81)
RDW: 13.5 % (ref 11.5–15.5)
WBC: 5.2 10*3/uL (ref 4.0–10.5)

## 2016-09-20 LAB — BASIC METABOLIC PANEL
Anion gap: 6 (ref 5–15)
BUN: 12 mg/dL (ref 6–20)
CHLORIDE: 105 mmol/L (ref 101–111)
CO2: 26 mmol/L (ref 22–32)
CREATININE: 1.52 mg/dL — AB (ref 0.61–1.24)
Calcium: 9.5 mg/dL (ref 8.9–10.3)
GFR calc Af Amer: 51 mL/min — ABNORMAL LOW (ref >60)
GFR calc non Af Amer: 44 mL/min — ABNORMAL LOW (ref >60)
GLUCOSE: 90 mg/dL (ref 65–99)
Potassium: 4.2 mmol/L (ref 3.5–5.1)
Sodium: 137 mmol/L (ref 135–145)

## 2016-09-20 LAB — PROTIME-INR
INR: 1.11
Prothrombin Time: 14.3 seconds (ref 11.4–15.2)

## 2016-09-20 NOTE — Progress Notes (Signed)
Robert Gardner denies chest pain, has history of Aflutter, and is on Eliquis- patient has been instructed to stop 3 days prior to surgery date.

## 2016-09-20 NOTE — Pre-Procedure Instructions (Signed)
    Robert Gardner  09/20/2016    Your procedure is scheduled on Wednesday, April 4.  Report to [facility] Admitting at 12:30 PM                 Your surgery or procedure is scheduled for 2:30 PM   Call this number if you have problems the morning of surgery: [phone]    Remember:  Do not eat food or drink liquids after midnight Tuesday, April 3.  Take these medicines the morning of surgery with A SIP OF WATER : amLODipine (NORVASC).  Use Combivent Inhaler.  May use Albuterol inhaler if needed and bring it to the hospital with you.    May take Tylenolol needed.                  Aspirin Products (Goody Powder, Excedrin Migraine), Ibuprofen (Advil), Naproxen (Aleve), Vitamins and Herbal Products (ie Fish Oil).               Hold Eliquis as instructed by Dr Ninfa Linden.   Do not wear jewelry, make-up or nail polish.  Do not wear lotions, powders, or perfumes, or deodorant.  Do not shave 48 hours prior to surgery.  Men may shave face and neck.  Do not bring valuables to the hospital.  [facility] is not responsible for any belongings or valuables.  Contacts, dentures or bridgework may not be worn into surgery.  Leave your suitcase in the car.  After surgery it may be brought to your room.  For patients admitted to the hospital, discharge time will be determined by your treatment team.  Patients discharged the day of surgery will not be allowed to drive home.   Name and phone number of your driver: -  Special instructions:  Review  [place] - Preparing For Surgery.  Please read over the following fact sheets that you were given: [facility]- Preparing For Surgery and Patient Instructions for Mupirocin Application, Coughing and Deep Breathing Pain Booklet

## 2016-09-25 NOTE — H&P (Signed)
Robert Gardner 08/28/2016 1:25 PM Location: [facility] Patient #: [id number] DOB: 1940-11-06 Married / Language: English / Race: Black or African American Male   History of Present Illness Robert Canary A. Robert Linden MD; 08/28/2016 1:36 PM) Patient words: New- R breast mass.  The patient is a 76 year old male who presents with a complaint of Mass. This is a pleasant gentleman with multiple medical problems Dr. Brand Males for uncomfortable right breast gynecomastia. The patient believes he started noticing it after a new inhaler. It is been increasing in size over the past several months. He denies nipple discharge. He had similar left breast gynecomastia many years ago requiring surgical excision. He is uncomfortable at the nipple areolar complex. He is otherwise without complaints. He has had a mammogram confirming gynecomastia without gross evidence of malignancy   Past Surgical History Robert Gardner, CMA; 08/28/2016 1:25 PM) Anal Fissure Repair  Breast Biopsy  Left. Knee Surgery  Right.  Diagnostic Studies History Robert Gardner, CMA; 08/28/2016 1:25 PM) Colonoscopy  never  Allergies Robert Gardner, CMA; 08/28/2016 1:26 PM) No Known Drug Allergies 08/28/2016  Medication History (Robert Gardner, CMA; 08/28/2016 1:27 PM) AmLODIPine Besylate (5MG  Tablet, Oral) Active. Furosemide (40MG  Tablet, Oral) Active. Potassium Chloride Crys ER (20MEQ Tablet ER, Oral) Active. Albuterol Sulfate (108 (90 Base)MCG/ACT Aero Pow Br Act, Inhalation) Active. Eliquis (2.5MG  Tablet, Oral) Active. Bevespi Aerosphere (9-4.8MCG/ACT Aerosol, Inhalation) Active. Multivitamin Adult (Oral) Active. Medications Reconciled  Social History Robert Gardner, CMA; 08/28/2016 1:25 PM) Alcohol use  Remotely quit alcohol use. Caffeine use  Coffee. No drug use  Tobacco use  Former smoker.  Family History Robert Gardner, CMA; 08/28/2016 1:25 PM) Arthritis  Mother. Bleeding disorder   Father. Heart Disease  Mother, Sister. Hypertension  Daughter, Mother. Respiratory Condition  Sister. Thyroid problems  Daughter.  Other Problems Robert Gardner, CMA; 08/28/2016 1:25 PM) Emphysema Of Lung  High blood pressure  Home Oxygen Use  Lump In Breast  Melanoma     Review of Systems (Robert Gardner CMA; 08/28/2016 1:25 PM) General Not Present- Appetite Loss, Chills, Fatigue, Fever, Night Sweats, Weight Gain and Weight Loss. Skin Not Present- Change in Wart/Mole, Dryness, Hives, Jaundice, New Lesions, Non-Healing Wounds, Rash and Ulcer. HEENT Not Present- Earache, Hearing Loss, Hoarseness, Nose Bleed, Oral Ulcers, Ringing in the Ears, Seasonal Allergies, Sinus Pain, Sore Throat, Visual Disturbances, Wears glasses/contact lenses and Yellow Eyes. Respiratory Present- Difficulty Breathing. Not Present- Bloody sputum, Chronic Cough, Snoring and Wheezing. Breast Present- Breast Mass. Not Present- Breast Pain, Nipple Discharge and Skin Changes. Cardiovascular Present- Shortness of Breath. Not Present- Chest Pain, Difficulty Breathing Lying Down, Leg Cramps, Palpitations, Rapid Heart Rate and Swelling of Extremities. Gastrointestinal Not Present- Abdominal Pain, Bloating, Bloody Stool, Change in Bowel Habits, Chronic diarrhea, Constipation, Difficulty Swallowing, Excessive gas, Gets full quickly at meals, Hemorrhoids, Indigestion, Nausea, Rectal Pain and Vomiting. Male Genitourinary Not Present- Blood in Urine, Change in Urinary Stream, Frequency, Impotence, Nocturia, Painful Urination, Urgency and Urine Leakage.  Vitals (Robert Gardner CMA; 08/28/2016 1:26 PM) 08/28/2016 1:26 PM Weight: 156 lb Height: 71in Body Surface Area: 1.9 m Body Mass Index: 21.76 kg/m  Temp.: 97.8F(Oral)  BP: 112/60 (Sitting, Left Arm, Standard)       Physical Exam (Dallas Torok A. Robert Linden MD; 08/28/2016 1:37 PM) General Mental Status-Alert. General Appearance-Consistent with stated  age. Hydration-Well hydrated. Voice-Normal.  Head and Neck Head-normocephalic, atraumatic with no lesions or palpable masses. Trachea-midline. Thyroid Gland Characteristics - normal size and consistency.  Eye Eyeball - Bilateral-Extraocular movements intact.  Sclera/Conjunctiva - Bilateral-No scleral icterus.  Chest and Lung Exam Chest and lung exam reveals -quiet, even and easy respiratory effort with no use of accessory muscles and on auscultation, normal breath sounds, no adventitious sounds and normal vocal resonance. Inspection Chest Wall - Normal. Back - normal.  Breast Breast - Left-Symmetric, Non Tender, No Biopsy scars, no Dimpling, No Inflammation, No Lumpectomy scars, No Mastectomy scars, No Peau d' Orange. Breast - Right-Tender, No Biopsy scars, No Dimpling, No Inflammation, No Lumpectomy scars, No Mastectomy scars, No Peau d' Orange. Note: The right breast is quite enlarged compared to the left breast. The gynecomastia is a proximally 6-7 cm in size compared to the left. There is tenderness. It is firm Breast Lump-No Palpable Breast Mass.  Cardiovascular Cardiovascular examination reveals -normal heart sounds, regular rate and rhythm with no murmurs and normal pedal pulses bilaterally.  Abdomen Inspection Inspection of the abdomen reveals - No Hernias. Skin - Scar - no surgical scars. Palpation/Percussion Palpation and Percussion of the abdomen reveal - Soft, Non Tender, No Rebound tenderness, No Rigidity (guarding) and No hepatosplenomegaly. Auscultation Auscultation of the abdomen reveals - Bowel sounds normal.  Neurologic Neurologic evaluation reveals -alert and oriented x 3 with no impairment of recent or remote memory. Mental Status-Normal.  Musculoskeletal Normal Exam - Left-Upper Extremity Strength Normal and Lower Extremity Strength Normal. Normal Exam - Right-Upper Extremity Strength Normal and Lower Extremity Strength  Normal.  Lymphatic Head & Neck  General Head & Neck Lymphatics: Bilateral - Description - Normal. Axillary  General Axillary Region: Bilateral - Description - Normal. Tenderness - Non Tender. Femoral & Inguinal  Generalized Femoral & Inguinal Lymphatics: Bilateral - Description - Normal. Tenderness - Non Tender.    Assessment & Plan (Roby Spalla A. Robert Linden MD; 08/28/2016 1:38 PM) Venetia Night (N62) Impression: Right breast symptomatic gynecomastia. He reports that his left side would never resolve requiring surgical excision. I believe this is similar to his right side. I believe he needs surgical excision of the right breast gynecomastia to both relieve his symptoms and for histologic evaluation to rule out malignancy. I discussed this with the patient and his wife in detail and he is eager to proceed with surgery. He would need to stop his blood thinning medication 3 days preoperatively. I discussed the surgical procedure with him in detail. I discussed the risk which includes but is not limited to bleeding, infection, need for further surgery if malignancy is present, postoperative seroma formation, cardiopulmonary issues, etc. We will get cardiac clearance preoperatively. Surgery will be scheduled

## 2016-09-26 ENCOUNTER — Encounter (HOSPITAL_COMMUNITY): Payer: Self-pay | Admitting: *Deleted

## 2016-09-26 ENCOUNTER — Ambulatory Visit (HOSPITAL_COMMUNITY): Payer: Medicare HMO | Admitting: Certified Registered Nurse Anesthetist

## 2016-09-26 ENCOUNTER — Ambulatory Visit (HOSPITAL_COMMUNITY)
Admission: RE | Admit: 2016-09-26 | Discharge: 2016-09-26 | Disposition: A | Discharge status: Home / Self Care | Payer: Medicare HMO | Source: Ambulatory Visit | Attending: Surgery | Admitting: Surgery

## 2016-09-26 ENCOUNTER — Encounter (HOSPITAL_COMMUNITY)
Admission: RE | Disposition: A | Discharge status: Home / Self Care | Payer: Self-pay | Source: Ambulatory Visit | Attending: Surgery

## 2016-09-26 DIAGNOSIS — I10 Essential (primary) hypertension: Secondary | ICD-10-CM | POA: Insufficient documentation

## 2016-09-26 DIAGNOSIS — I129 Hypertensive chronic kidney disease with stage 1 through stage 4 chronic kidney disease, or unspecified chronic kidney disease: Secondary | ICD-10-CM | POA: Diagnosis not present

## 2016-09-26 DIAGNOSIS — J439 Emphysema, unspecified: Secondary | ICD-10-CM | POA: Diagnosis not present

## 2016-09-26 DIAGNOSIS — Z85820 Personal history of malignant melanoma of skin: Secondary | ICD-10-CM | POA: Insufficient documentation

## 2016-09-26 DIAGNOSIS — Z832 Family history of diseases of the blood and blood-forming organs and certain disorders involving the immune mechanism: Secondary | ICD-10-CM | POA: Diagnosis not present

## 2016-09-26 DIAGNOSIS — Z87891 Personal history of nicotine dependence: Secondary | ICD-10-CM | POA: Insufficient documentation

## 2016-09-26 DIAGNOSIS — I4891 Unspecified atrial fibrillation: Secondary | ICD-10-CM | POA: Insufficient documentation

## 2016-09-26 DIAGNOSIS — N62 Hypertrophy of breast: Secondary | ICD-10-CM | POA: Insufficient documentation

## 2016-09-26 DIAGNOSIS — Z8249 Family history of ischemic heart disease and other diseases of the circulatory system: Secondary | ICD-10-CM | POA: Insufficient documentation

## 2016-09-26 DIAGNOSIS — Z8261 Family history of arthritis: Secondary | ICD-10-CM | POA: Diagnosis not present

## 2016-09-26 DIAGNOSIS — N183 Chronic kidney disease, stage 3 (moderate): Secondary | ICD-10-CM | POA: Diagnosis not present

## 2016-09-26 DIAGNOSIS — I5042 Chronic combined systolic (congestive) and diastolic (congestive) heart failure: Secondary | ICD-10-CM | POA: Diagnosis not present

## 2016-09-26 DIAGNOSIS — Z836 Family history of other diseases of the respiratory system: Secondary | ICD-10-CM | POA: Insufficient documentation

## 2016-09-26 DIAGNOSIS — Z9981 Dependence on supplemental oxygen: Secondary | ICD-10-CM | POA: Diagnosis not present

## 2016-09-26 DIAGNOSIS — Z8349 Family history of other endocrine, nutritional and metabolic diseases: Secondary | ICD-10-CM | POA: Diagnosis not present

## 2016-09-26 HISTORY — PX: BREAST LUMPECTOMY: SHX2

## 2016-09-26 SURGERY — BREAST LUMPECTOMY
Anesthesia: General | Site: Breast | Laterality: Right

## 2016-09-26 MED ORDER — PHENYLEPHRINE 40 MCG/ML (10ML) SYRINGE FOR IV PUSH (FOR BLOOD PRESSURE SUPPORT)
PREFILLED_SYRINGE | INTRAVENOUS | Status: DC | PRN
  Administered 2016-09-26: 80 ug via INTRAVENOUS

## 2016-09-26 MED ORDER — OXYCODONE HCL 5 MG PO TABS
5.0000 mg | ORAL_TABLET | ORAL | Status: DC | PRN
  Administered 2016-09-26: 5 mg via ORAL

## 2016-09-26 MED ORDER — EPHEDRINE SULFATE-NACL 50-0.9 MG/10ML-% IV SOSY
PREFILLED_SYRINGE | INTRAVENOUS | Status: DC | PRN
  Administered 2016-09-26: 10 mg via INTRAVENOUS
  Administered 2016-09-26: 5 mg via INTRAVENOUS
  Administered 2016-09-26: 10 mg via INTRAVENOUS

## 2016-09-26 MED ORDER — OXYCODONE HCL 5 MG PO TABS: 5.0000 mg | ORAL_TABLET | Freq: Four times a day (QID) | ORAL | 0 refills | Status: DC | PRN

## 2016-09-26 MED ORDER — SODIUM CHLORIDE 0.9 % IV SOLN: 250.0000 mL | INTRAVENOUS | Status: DC | PRN

## 2016-09-26 MED ORDER — MEPERIDINE HCL 25 MG/ML IJ SOLN
6.2500 mg | INTRAMUSCULAR | Status: DC | PRN
Start: 2016-09-26 — End: 2016-09-26

## 2016-09-26 MED ORDER — BUPIVACAINE HCL (PF) 0.25 % IJ SOLN
INTRAMUSCULAR | Status: DC | PRN
  Administered 2016-09-26: 16 mL

## 2016-09-26 MED ORDER — EPHEDRINE 5 MG/ML INJ
INTRAVENOUS | Status: AC
  Filled 2016-09-26: qty 10

## 2016-09-26 MED ORDER — PROPOFOL 10 MG/ML IV BOLUS
INTRAVENOUS | Status: DC | PRN
  Administered 2016-09-26: 150 mg via INTRAVENOUS

## 2016-09-26 MED ORDER — ACETAMINOPHEN 325 MG PO TABS: 650.0000 mg | ORAL_TABLET | ORAL | Status: DC | PRN

## 2016-09-26 MED ORDER — CEFAZOLIN SODIUM-DEXTROSE 2-4 GM/100ML-% IV SOLN
2.0000 g | INTRAVENOUS | Status: AC
  Administered 2016-09-26: 2 g via INTRAVENOUS

## 2016-09-26 MED ORDER — FENTANYL CITRATE (PF) 100 MCG/2ML IJ SOLN
INTRAMUSCULAR | Status: DC | PRN
  Administered 2016-09-26: 25 ug via INTRAVENOUS
  Administered 2016-09-26 (×2): 50 ug via INTRAVENOUS

## 2016-09-26 MED ORDER — CEFAZOLIN SODIUM-DEXTROSE 2-4 GM/100ML-% IV SOLN
INTRAVENOUS | Status: AC
  Filled 2016-09-26: qty 100

## 2016-09-26 MED ORDER — LIDOCAINE 2% (20 MG/ML) 5 ML SYRINGE
INTRAMUSCULAR | Status: DC | PRN
Start: 2016-09-26 — End: 2016-09-26
  Administered 2016-09-26: 100 mg via INTRAVENOUS

## 2016-09-26 MED ORDER — SODIUM CHLORIDE 0.9% FLUSH: 3.0000 mL | INTRAVENOUS | Status: DC | PRN

## 2016-09-26 MED ORDER — FENTANYL CITRATE (PF) 250 MCG/5ML IJ SOLN
INTRAMUSCULAR | Status: AC
  Filled 2016-09-26: qty 5

## 2016-09-26 MED ORDER — LACTATED RINGERS IV SOLN
INTRAVENOUS | Status: DC | PRN
  Administered 2016-09-26: 13:00:00 via INTRAVENOUS

## 2016-09-26 MED ORDER — 0.9 % SODIUM CHLORIDE (POUR BTL) OPTIME
TOPICAL | Status: DC | PRN
  Administered 2016-09-26: 1000 mL

## 2016-09-26 MED ORDER — SODIUM CHLORIDE 0.9% FLUSH: 3.0000 mL | Freq: Two times a day (BID) | INTRAVENOUS | Status: DC

## 2016-09-26 MED ORDER — METOCLOPRAMIDE HCL 5 MG/ML IJ SOLN: 10.0000 mg | Freq: Once | INTRAMUSCULAR | Status: DC | PRN

## 2016-09-26 MED ORDER — FENTANYL CITRATE (PF) 100 MCG/2ML IJ SOLN
25.0000 ug | INTRAMUSCULAR | Status: DC | PRN
  Administered 2016-09-26 (×2): 50 ug via INTRAVENOUS

## 2016-09-26 MED ORDER — LACTATED RINGERS IV SOLN
INTRAVENOUS | Status: DC
  Administered 2016-09-26: 13:00:00 via INTRAVENOUS

## 2016-09-26 MED ORDER — CHLORHEXIDINE GLUCONATE CLOTH 2 % EX PADS: 6.0000 | MEDICATED_PAD | Freq: Once | CUTANEOUS | Status: DC

## 2016-09-26 MED ORDER — BUPIVACAINE HCL (PF) 0.25 % IJ SOLN
INTRAMUSCULAR | Status: AC
  Filled 2016-09-26: qty 30

## 2016-09-26 MED ORDER — LIDOCAINE 2% (20 MG/ML) 5 ML SYRINGE
INTRAMUSCULAR | Status: AC
  Filled 2016-09-26: qty 5

## 2016-09-26 MED ORDER — ACETAMINOPHEN 650 MG RE SUPP: 650.0000 mg | RECTAL | Status: DC | PRN

## 2016-09-26 MED ORDER — MORPHINE SULFATE (PF) 2 MG/ML IV SOLN: 1.0000 mg | INTRAVENOUS | Status: DC | PRN

## 2016-09-26 MED ORDER — MIDAZOLAM HCL 2 MG/2ML IJ SOLN
INTRAMUSCULAR | Status: AC
  Filled 2016-09-26: qty 2

## 2016-09-26 MED ORDER — OXYCODONE HCL 5 MG PO TABS
ORAL_TABLET | ORAL | Status: AC
  Filled 2016-09-26: qty 1

## 2016-09-26 MED ORDER — PHENYLEPHRINE 40 MCG/ML (10ML) SYRINGE FOR IV PUSH (FOR BLOOD PRESSURE SUPPORT)
PREFILLED_SYRINGE | INTRAVENOUS | Status: AC
  Filled 2016-09-26: qty 10

## 2016-09-26 MED ORDER — FENTANYL CITRATE (PF) 100 MCG/2ML IJ SOLN
INTRAMUSCULAR | Status: DC
Start: 2016-09-26 — End: 2016-09-26
  Filled 2016-09-26: qty 2

## 2016-09-26 MED ORDER — PROPOFOL 10 MG/ML IV BOLUS
INTRAVENOUS | Status: AC
  Filled 2016-09-26: qty 20

## 2016-09-26 SURGICAL SUPPLY — 39 items
BINDER BREAST LRG (GAUZE/BANDAGES/DRESSINGS) IMPLANT
BINDER BREAST XLRG (GAUZE/BANDAGES/DRESSINGS) IMPLANT
CANISTER SUCT 3000ML PPV (MISCELLANEOUS) ×3 IMPLANT
CHLORAPREP W/TINT 26ML (MISCELLANEOUS) ×3 IMPLANT
CONT SPEC 4OZ CLIKSEAL STRL BL (MISCELLANEOUS) ×3 IMPLANT
COVER SURGICAL LIGHT HANDLE (MISCELLANEOUS) ×3 IMPLANT
DECANTER SPIKE VIAL GLASS SM (MISCELLANEOUS) IMPLANT
DERMABOND ADVANCED (GAUZE/BANDAGES/DRESSINGS) ×2
DERMABOND ADVANCED .7 DNX12 (GAUZE/BANDAGES/DRESSINGS) ×1 IMPLANT
DEVICE DUBIN SPECIMEN MAMMOGRA (MISCELLANEOUS) IMPLANT
DRAPE LAPAROTOMY 100X72 PEDS (DRAPES) ×3 IMPLANT
DRAPE UTILITY XL STRL (DRAPES) ×6 IMPLANT
ELECT CAUTERY BLADE 6.4 (BLADE) ×3 IMPLANT
ELECT REM PT RETURN 9FT ADLT (ELECTROSURGICAL) ×3
ELECTRODE REM PT RTRN 9FT ADLT (ELECTROSURGICAL) ×1 IMPLANT
GAUZE SPONGE 4X4 16PLY XRAY LF (GAUZE/BANDAGES/DRESSINGS) ×3 IMPLANT
GLOVE SURG SIGNA 7.5 PF LTX (GLOVE) ×3 IMPLANT
GOWN STRL REUS W/ TWL LRG LVL3 (GOWN DISPOSABLE) ×3 IMPLANT
GOWN STRL REUS W/ TWL XL LVL3 (GOWN DISPOSABLE) ×1 IMPLANT
GOWN STRL REUS W/TWL LRG LVL3 (GOWN DISPOSABLE) ×6
GOWN STRL REUS W/TWL XL LVL3 (GOWN DISPOSABLE) ×2
KIT BASIN OR (CUSTOM PROCEDURE TRAY) ×3 IMPLANT
KIT MARKER MARGIN INK (KITS) IMPLANT
KIT ROOM TURNOVER OR (KITS) ×3 IMPLANT
NEEDLE HYPO 25GX1X1/2 BEV (NEEDLE) ×3 IMPLANT
NS IRRIG 1000ML POUR BTL (IV SOLUTION) ×3 IMPLANT
PACK SURGICAL SETUP 50X90 (CUSTOM PROCEDURE TRAY) ×3 IMPLANT
PAD ARMBOARD 7.5X6 YLW CONV (MISCELLANEOUS) ×3 IMPLANT
PENCIL BUTTON HOLSTER BLD 10FT (ELECTRODE) ×3 IMPLANT
SUT MNCRL AB 4-0 PS2 18 (SUTURE) ×3 IMPLANT
SUT VIC AB 3-0 SH 27 (SUTURE) ×2
SUT VIC AB 3-0 SH 27X BRD (SUTURE) ×1 IMPLANT
SYR BULB 3OZ (MISCELLANEOUS) ×3 IMPLANT
SYR CONTROL 10ML LL (SYRINGE) ×3 IMPLANT
TOWEL OR 17X24 6PK STRL BLUE (TOWEL DISPOSABLE) ×3 IMPLANT
TOWEL OR 17X26 10 PK STRL BLUE (TOWEL DISPOSABLE) ×3 IMPLANT
TUBE CONNECTING 12'X1/4 (SUCTIONS)
TUBE CONNECTING 12X1/4 (SUCTIONS) IMPLANT
YANKAUER SUCT BULB TIP NO VENT (SUCTIONS) ×3 IMPLANT

## 2016-09-26 NOTE — Interval H&P Note (Signed)
History and Physical Interval Note:no change in H and P  09/26/2016 1:42 PM  Robert Gardner  has presented today for surgery, with the diagnosis of Gynecomastia  The various methods of treatment have been discussed with the patient and family. After consideration of risks, benefits and other options for treatment, the patient has consented to  Procedure(s): RIGHT BREAST LUMPECTOMY (Right) as a surgical intervention .  The patient's history has been reviewed, patient examined, no change in status, stable for surgery.  I have reviewed the patient's chart and labs.  Questions were answered to the patient's satisfaction.     Robert Gardner A

## 2016-09-26 NOTE — OR Nursing (Signed)
Rhythm with some SB, some block, reported by CRNA to be baseline and seen on 12 lead before case/

## 2016-09-26 NOTE — Anesthesia Preprocedure Evaluation (Signed)
Anesthesia Evaluation  Patient identified by MRN, date of birth, ID band Patient awake    Reviewed: Allergy & Precautions, NPO status , Patient's Chart, lab work & pertinent test results  Airway Mallampati: II  TM Distance: >3 FB Neck ROM: Full    Dental no notable dental hx.    Pulmonary COPD, former smoker,    Pulmonary exam normal breath sounds clear to auscultation       Cardiovascular hypertension, Pt. on medications Normal cardiovascular exam+ dysrhythmias (s/p ablation) Atrial Fibrillation + Valvular Problems/Murmurs MR  Rhythm:Regular Rate:Normal     Neuro/Psych neg Seizures negative neurological ROS  negative psych ROS   GI/Hepatic negative GI ROS, Neg liver ROS,   Endo/Other  negative endocrine ROS  Renal/GU negative Renal ROS  negative genitourinary   Musculoskeletal negative musculoskeletal ROS (+)   Abdominal   Peds negative pediatric ROS (+)  Hematology negative hematology ROS (+)   Anesthesia Other Findings   Reproductive/Obstetrics negative OB ROS                            Anesthesia Physical Anesthesia Plan  ASA: III  Anesthesia Plan: General   Post-op Pain Management:    Induction: Intravenous  Airway Management Planned: LMA  Additional Equipment:   Intra-op Plan:   Post-operative Plan: Extubation in OR  Informed Consent: I have reviewed the patients History and Physical, chart, labs and discussed the procedure including the risks, benefits and alternatives for the proposed anesthesia with the patient or authorized representative who has indicated his/her understanding and acceptance.   Dental advisory given  Plan Discussed with: CRNA  Anesthesia Plan Comments:         Anesthesia Quick Evaluation

## 2016-09-26 NOTE — Op Note (Signed)
RIGHT BREAST LUMPECTOMY  Procedure Note  Robert Gardner 09/26/2016   Pre-op Diagnosis: Right breast Gynecomastia     Post-op Diagnosis: same  Procedure(s): RIGHT BREAST LUMPECTOMY  Surgeon(s): Coralie Keens, MD  Anesthesia: General  Staff:  Circulator: Candie Mile, RN Scrub Person: Derrill Center, RN  Estimated Blood Loss: Minimal               Specimens: sent to path  Indications: This is a 76 year old gentleman who has had a previous left breast lumpectomy for gynecomastia who now has gynecomastia of the right breast. We'll now proceed with a right breast lumpectomy  Procedure: The patient was brought to the operating room and identified as the correct patient. He was placed supine on the operative table and general anesthesia was induced. His right chest was then prepped and draped in usual sterile fashion. I anesthetized the skin to the lower edge of the areola with Marcaine. I then made a circumareolar incision at the lower edge of the areola with the scalpel.  I then took this down into the breast tissue with electrocautery. I then performed a wide lumpectomy: Invasive the areola removing all the breast tissue and took this down to the chest wall. This was a firm area of breast tissue. Once the specimen was removed and sent to pathology for evaluation. I then achieved hemostasis with the cautery. I irrigated with saline. I anesthetized further with Marcaine. I then closed the subcutaneous tissue with interrupted 3-0 Vicryl sutures and closed skin with a running 4-0 Monocryl. Skin glue was then applied. The patient tolerated the procedure well. All counts were correct at the end of the procedure. The patient was then asked abated and the operating room and taken in a stable condition to the recovery room.          [name] A   Date: 09/26/2016  Time: 2:50 PM

## 2016-09-26 NOTE — Anesthesia Procedure Notes (Signed)
Procedure Name: LMA Insertion Date/Time: 09/26/2016 2:14 PM Performed by: Everlean Cherry A Pre-anesthesia Checklist: Patient identified, Emergency Drugs available, Suction available and Patient being monitored Patient Re-evaluated:Patient Re-evaluated prior to inductionOxygen Delivery Method: Circle System Utilized Preoxygenation: Pre-oxygenation with 100% oxygen Intubation Type: IV induction Ventilation: Mask ventilation without difficulty LMA: LMA inserted LMA Size: 4.0 Number of attempts: 1 Placement Confirmation: positive ETCO2 Tube secured with: Tape Dental Injury: Teeth and Oropharynx as per pre-operative assessment

## 2016-09-26 NOTE — Discharge Instructions (Signed)
[place] Office Phone Number [phone]  BREAST BIOPSY/ PARTIAL MASTECTOMY: POST OP INSTRUCTIONS  Always review your discharge instruction sheet given to you by the facility where your surgery was performed.  IF YOU HAVE DISABILITY OR FAMILY LEAVE FORMS, YOU MUST BRING THEM TO THE OFFICE FOR PROCESSING.  DO NOT GIVE THEM TO YOUR DOCTOR.  1. A prescription for pain medication may be given to you upon discharge.  Take your pain medication as prescribed, if needed.  If narcotic pain medicine is not needed, then you may take acetaminophen (Tylenol) or ibuprofen (Advil) as needed. 2. Take your usually prescribed medications unless otherwise directed 3. If you need a refill on your pain medication, please contact your pharmacy.  They will contact our office to request authorization.  Prescriptions will not be filled after 5pm or on week-ends. 4. You should eat very light the first 24 hours after surgery, such as soup, crackers, pudding, etc.  Resume your normal diet the day after surgery. 5. Most patients will experience some swelling and bruising in the breast.  Ice packs and a good support bra will help.  Swelling and bruising can take several days to resolve.  6. It is common to experience some constipation if taking pain medication after surgery.  Increasing fluid intake and taking a stool softener will usually help or prevent this problem from occurring.  A mild laxative (Milk of Magnesia or Miralax) should be taken according to package directions if there are no bowel movements after 48 hours. 7. Unless discharge instructions indicate otherwise, you may remove your bandages 24-48 hours after surgery, and you may shower at that time.  You may have steri-strips (small skin tapes) in place directly over the incision.  These strips should be left on the skin for 7-10 days.  If your surgeon used skin glue on the incision, you may shower in 24 hours.  The glue will flake off over the  next 2-3 weeks.  Any sutures or staples will be removed at the office during your follow-up visit. 8. ACTIVITIES:  You may resume regular daily activities (gradually increasing) beginning the next day.  Wearing a good support bra or sports bra minimizes pain and swelling.  You may have sexual intercourse when it is comfortable. a. You may drive when you no longer are taking prescription pain medication, you can comfortably wear a seatbelt, and you can safely maneuver your car and apply brakes. b. RETURN TO WORK:  ______________________________________________________________________________________ 9. You should see your doctor in the office for a follow-up appointment approximately two weeks after your surgery.  Your doctors nurse will typically make your follow-up appointment when she calls you with your pathology report.  Expect your pathology report 2-3 business days after your surgery.  You may call to check if you do not hear from [place] after three days. 10. OTHER INSTRUCTIONS: ____ok to shower tomorrow 11. Ice pack and ibuprofen also for pain___________________________________________________________________________________________ _____________________________________________________________________________________________________________________________________ _____________________________________________________________________________________________________________________________________ _____________________________________________________________________________________________________________________________________  WHEN TO CALL YOUR DOCTOR: 1. Fever over 101.0 2. Nausea and/or vomiting. 3. Extreme swelling or bruising. 4. Continued bleeding from incision. 5. Increased pain, redness, or drainage from the incision.  The clinic staff is available to answer your questions during regular business hours.  Please dont hesitate to call and ask to speak to one of the nurses for clinical  concerns.  If you have a medical emergency, go to the nearest emergency room or call 911.  A surgeon from [facility] is always on call  at the hospital.  For further questions, please visit [email or web address]

## 2016-09-26 NOTE — Anesthesia Postprocedure Evaluation (Signed)
Anesthesia Post Note  Patient: Robert Gardner  Procedure(s) Performed: Procedure(s) (LRB): RIGHT BREAST LUMPECTOMY (Right)  Patient location during evaluation: PACU Anesthesia Type: General Level of consciousness: awake and alert Pain management: pain level controlled Vital Signs Assessment: post-procedure vital signs reviewed and stable Respiratory status: spontaneous breathing, nonlabored ventilation, respiratory function stable and patient connected to nasal cannula oxygen Cardiovascular status: blood pressure returned to baseline and stable Postop Assessment: no signs of nausea or vomiting Anesthetic complications: no       Last Vitals:  Vitals:   09/26/16 1545 09/26/16 1600  BP: (!) 153/73 133/74  Pulse: (!) 56 68  Resp: 10 13  Temp:      Last Pain:  Vitals:   09/26/16 1600  TempSrc:   PainSc: 3                  Montez Hageman

## 2016-09-26 NOTE — Transfer of Care (Signed)
Immediate Anesthesia Transfer of Care Note  Patient: Robert Gardner  Procedure(s) Performed: Procedure(s): RIGHT BREAST LUMPECTOMY (Right)  Patient Location: PACU  Anesthesia Type:General  Level of Consciousness: awake, alert , oriented and patient cooperative  Airway & Oxygen Therapy: Patient Spontanous Breathing and Patient connected to nasal cannula oxygen  Post-op Assessment: Report given to RN and Post -op Vital signs reviewed and stable  Post vital signs: Reviewed and stable  Last Vitals:  Vitals:   09/26/16 1240  BP: (!) 169/71  Pulse: 60  Resp: 18  Temp: 37 C    Last Pain:  Vitals:   09/26/16 1244  TempSrc:   PainSc: 1       Patients Stated Pain Goal: 1 (15/17/61 6073)  Complications: No apparent anesthesia complications

## 2016-09-27 ENCOUNTER — Encounter (HOSPITAL_COMMUNITY): Payer: Self-pay | Admitting: Surgery

## 2016-10-02 NOTE — Telephone Encounter (Signed)
Rec'd another set of long term disability forms from [employer] and request for records- fwd to [employer] - 10/02/16 -pr

## 2016-10-11 DIAGNOSIS — I509 Heart failure, unspecified: Secondary | ICD-10-CM | POA: Diagnosis not present

## 2016-10-26 ENCOUNTER — Other Ambulatory Visit: Payer: Self-pay | Admitting: *Deleted

## 2016-10-26 DIAGNOSIS — D649 Anemia, unspecified: Secondary | ICD-10-CM | POA: Diagnosis not present

## 2016-10-26 DIAGNOSIS — J439 Emphysema, unspecified: Secondary | ICD-10-CM | POA: Diagnosis not present

## 2016-10-26 DIAGNOSIS — I110 Hypertensive heart disease with heart failure: Secondary | ICD-10-CM | POA: Diagnosis not present

## 2016-10-26 DIAGNOSIS — I10 Essential (primary) hypertension: Secondary | ICD-10-CM | POA: Diagnosis not present

## 2016-10-26 DIAGNOSIS — I509 Heart failure, unspecified: Secondary | ICD-10-CM | POA: Diagnosis not present

## 2016-10-26 DIAGNOSIS — I480 Paroxysmal atrial fibrillation: Secondary | ICD-10-CM | POA: Diagnosis not present

## 2016-10-29 MED ORDER — APIXABAN 5 MG PO TABS: 5.0000 mg | ORAL_TABLET | Freq: Two times a day (BID) | ORAL | 1 refills | Status: DC

## 2016-10-29 NOTE — Telephone Encounter (Signed)
Pt on Eliquis 2.5mg  BID for Afib, based on Age <80, wt >60kg pt should be on Eliquis 5mg  BID. New RX sent to pharmacy.   Spoke with pt wife and made aware of dose change to 5mg  BID. She will have him call with any questions.

## 2016-11-10 DIAGNOSIS — I509 Heart failure, unspecified: Secondary | ICD-10-CM | POA: Diagnosis not present

## 2016-11-20 ENCOUNTER — Encounter: Payer: Self-pay | Admitting: Internal Medicine

## 2016-11-20 ENCOUNTER — Other Ambulatory Visit: Payer: Medicare HMO

## 2016-11-20 ENCOUNTER — Ambulatory Visit (INDEPENDENT_AMBULATORY_CARE_PROVIDER_SITE_OTHER): Payer: Medicare HMO | Admitting: Internal Medicine

## 2016-11-20 VITALS — BP 136/60 | HR 56 | Ht 71.0 in | Wt 155.6 lb

## 2016-11-20 DIAGNOSIS — J449 Chronic obstructive pulmonary disease, unspecified: Secondary | ICD-10-CM | POA: Diagnosis not present

## 2016-11-20 NOTE — Patient Instructions (Signed)
ICD-9-CM ICD-10-CM   1. COPD, moderate ([place]) 496 J44.9    Stable disease  Plan Check alpha 1 genetic test for copd Continue bevespi - 2puff twice daily scheduled - take samples, price drug Use albuterol as needed Flu shot in fall Talk about new Shingarix vaccine for shingles with PCP Antony Contras, MD  Followup 9 months or sooner if needed

## 2016-11-20 NOTE — Progress Notes (Signed)
Subjective:     Patient ID: Robert Gardner, male   DOB: January 18, 1941, 76 y.o.   MRN: [id number]  HPI  former smoker followed for Moderate COPD/Emphysema  Combined diastolic systolic congestive heart failure Atrial  fibrillation on Eliquis  TEST  11/18/15:  Full pulmonary function test 11/18/2015 shows FVC 2.23 L/58%. FEV1 1.82 L/64% with a ratio of 82. There is no broncho-dilator response. DLCO is 13.4/41% and significantly reduced. Overall restriction with low DLCO. 01/11/2016: Walking desaturation test: [name] from 99% to 94%.. Stop at the end of 2 laps due to lightheadedness. This was on room air. He did not complete his third lap.  01/11/2016: CBC shows hemoglobin of 10.3 g percent and this is improved since postoperative blood loss in May 2017.  01/13/2016: High-resolution CT chest personally visualized negative findings. He has bilateral apical emphysema associated with air trapping. He also has evidence of pulmonary hypertension. There is no fibrotic lung disease or nodules. Previously on glass opacities have resolved.  03/29/2016 acute office visit Patient returns for work in visit. Patient was seen in July for COPD. Patient was found to have emphysema on his high resolution CT chest with no evidence of interstitial lung disease. PFT showed restrictive lung disease with an FEV1 at 64% and a ratio of 82. DLCO was decreased at 41%. Walk test w/ no desats in office.  Patient was started on Incruse . Patient says that shortly after starting this inhaler that he noticed that he had enlarged right breast tissue. Patient was seen by his primary care physician and set up for a mammogram.  This was done on 03/01/2016 that showed no concerning masses. Felt to have right gynecomastia. Medication review was done, denies use of Aldactone or finasteride.. Explained to him and his wife that it would be uncommon for increased to have this particular side effect. She is on a calcium channel blocker with  amlodipine, for a long time. Calcium channel blockers can cause gynecomastia. She denies any chest pain, orthopnea, PND, or increased leg swelling. He declines a flu shot.   OV 04/24/2016  Chief Complaint  Patient presents with  . Follow-up    Pt states since seeing TP on 10.5.17 he does not feel back to baseline. Pt c/o increase in SOB, dry cough, right sided chest discomfort. Pt denies f/c/s.     Follow-up moderate COPD with chronic systolic and diastolic heart failure  I personally saw him in July 2017 at which time dyspnea is been deemed due to COPD, anemia and posthospitalization fatigue and deconditioning and chronic systolic and diastolic heart failure. I put him on long-acting anticholinergic manufactured by [employer]. Sometime after that he was diagnosed with gynecomastia so he wanted to change even though this was not the causative agent. He was started on Spiriva one follow-up visit earlier this month which he says is only helping somewhat. He still has significant fatigue and deconditioning. He is only somewhat improved since his hospitalization earlier this year.Review of the labs show that he is still anemic and as of last week his creatinine is suddenly increased 1.8 mg percent. He feels is not nervous situation where he can work anymore. He is not having any active bleeding   OV 08/20/2016  Chief Complaint  Patient presents with  . Follow-up    Pt states he feels he is doing well - states he has noticed an improvement since taking the bevespi. Pt c/o nonprod cough and occ midsternal chest pain.    Follow-up  moderate COPD with chronic systolic and diastolic heart failure  Last seen October 2017. He presents with his wife [name] . Overall he is feeling stable. He likes [employer] dual inhaler long-acting beta agonist glycopyrrolate and long-acting inhaled steroid budesonide (BEVESPII). He is not in COPD exacerbation. His fatigue is improved. He does have some  occasional nonspecific midsternal chest pain. He has an upcoming appointment with her. He is concerned about his right breast lump. In September 2017 he had a mammogram and apparently he was reassured. However they're extremely concerned about this and is requesting a referral.   OV 11/20/2016  Chief Complaint  Patient presents with  . Follow-up    Pt states his breathing is doing well. Pt states his SOB is at baseline. Pt denies cough and CP/tightness.    Follow-up moderate COPD in the setting of chronic systolic and diastolic heart failure.  Last seen February 2018. At that point in time he is concerned about a breast lump. In April 2018 he did have biopsy of this on the right side. I visualized and review the report. It is not malignancy. It is gynecomastia. Terms of his COPD is stable. He really likes the inhaler BEVESPI . He takes it 2 puff 2 times daily. He uses albuterol for rescue but has not needed it since last visit but he always carries it in the pocket. He has not priced the inhaler BEVESPI as yet     has a past medical history of Arthritis; Atrial flutter ([place]); Bell palsy (2013); [facility]); CHF (congestive heart failure) ([place]); CKD (chronic kidney disease), stage II; Combined systolic and diastolic heart failure ([place]); Erectile dysfunction; Hypertension; Mitral regurgitation; Mobitz (type) I (Wenckebach's) atrioventricular block; Normocytic anemia; Second degree AV block, Mobitz type I (09/08/2015); Shortness of breath dyspnea; and Thyroid enlargement.   reports that he quit smoking about 22 years ago. His smoking use included Cigarettes. He has a 42.00 pack-year smoking history. He has never used smokeless tobacco.  Past Surgical History:  Procedure Laterality Date  . ABLATION  11-11-2013   CTI ablation by Dr Lovena Le  . ATRIAL FLUTTER ABLATION N/A 11/11/2013   Procedure: ATRIAL FLUTTER ABLATION;  Surgeon: Evans Lance, MD;  Location: [facility] CATH LAB;  Service: Cardiovascular;   Laterality: N/A;  . BREAST LUMPECTOMY Right 09/26/2016   Procedure: RIGHT BREAST LUMPECTOMY;  Surgeon: Coralie Keens, MD;  Location: [place];  Service: General;  Laterality: Right;  . CARDIAC CATHETERIZATION N/A 10/05/2015   Procedure: Left Heart Cath and Coronary Angiography;  Surgeon: Troy Sine, MD;  Location: [place] CV LAB;  Service: Cardiovascular;  Laterality: N/A;  . CYST REMOVAL TRUNK     like a cyst  . Rectal tear     repair  . Resection of melanoma Right    Leg  . TOTAL KNEE ARTHROPLASTY Right 10/31/2015   Procedure: RIGHT TOTAL KNEE ARTHROPLASTY;  Surgeon: Frederik Pear, MD;  Location: [place];  Service: Orthopedics;  Laterality: Right;    Allergies  Allergen Reactions  . No Known Allergies     Immunization History  Administered Date(s) Administered  . Pneumococcal Polysaccharide-23 01/11/2015    Family History  Problem Relation Age of Onset  . Heart disease Mother        unclear details  . Heart disease Father        unclear details  . Heart disease Sister        unclear details     Current Outpatient Prescriptions:  .  acetaminophen (TYLENOL) 500 MG tablet, Take 1,000 mg by mouth every 6 (six) hours as needed for mild pain., Disp: , Rfl:  .  albuterol (PROVENTIL HFA;VENTOLIN HFA) 108 (90 Base) MCG/ACT inhaler, Inhale 2 puffs into the lungs every 6 (six) hours as needed for wheezing or shortness of breath., Disp: 1 Inhaler, Rfl: 5 .  amLODipine (NORVASC) 5 MG tablet, TAKE ONE TABLET BY MOUTH ONCE DAILY, Disp: 180 tablet, Rfl: 3 .  apixaban (ELIQUIS) 5 MG TABS tablet, Take 1 tablet (5 mg total) by mouth 2 (two) times daily., Disp: 180 tablet, Rfl: 1 .  Glycopyrrolate-Formoterol (BEVESPI AEROSPHERE) 9-4.8 MCG/ACT AERO, Inhale 2 puffs into the lungs 2 (two) times daily., Disp: , Rfl:  .  Multiple Vitamin (MULTIVITAMIN WITH MINERALS) TABS tablet, Take 1 tablet by mouth daily., Disp: , Rfl:  .  OVER THE COUNTER MEDICATION, Take 1,000 mg by mouth daily. [place]  Natural Multivitamin Boosts Immunity  Promotes Weight Loss Reduces Stress, Disp: , Rfl:  .  oxyCODONE (OXY IR/ROXICODONE) 5 MG immediate release tablet, Take 1-2 tablets (5-10 mg total) by mouth every 6 (six) hours as needed for moderate pain or severe pain., Disp: 25 tablet, Rfl: 0 .  potassium chloride SA (K-DUR,KLOR-CON) 20 MEQ tablet, Take 1 tablet (20 mEq total) by mouth every other day. Take every other day with Lasix. (Patient taking differently: Take 20 mEq by mouth every other day. ), Disp: 15 tablet, Rfl: 11 .  torsemide (DEMADEX) 20 MG tablet, Take 0.5 tablets (10 mg total) by mouth daily., Disp: 30 tablet, Rfl: 9   Review of Systems     Objective:   Physical Exam  Constitutional: He is oriented to person, place, and time. He appears well-developed and well-nourished. No distress.  HENT:  Head: Normocephalic and atraumatic.  Right Ear: External ear normal.  Left Ear: External ear normal.  Mouth/Throat: Oropharynx is clear and moist. No oropharyngeal exudate.  Eyes: Conjunctivae and EOM are normal. Pupils are equal, round, and reactive to light. Right eye exhibits no discharge. Left eye exhibits no discharge. No scleral icterus.  Neck: Normal range of motion. Neck supple. No JVD present. No tracheal deviation present. No thyromegaly present.  Cardiovascular: Normal rate, regular rhythm and intact distal pulses.  Exam reveals no gallop and no friction rub.   No murmur heard. Pulmonary/Chest: Effort normal and breath sounds normal. No respiratory distress. He has no wheezes. He has no rales. He exhibits no tenderness.  Abdominal: Soft. Bowel sounds are normal. He exhibits no distension and no mass. There is no tenderness. There is no rebound and no guarding.  Musculoskeletal: Normal range of motion. He exhibits no edema or tenderness.  Lymphadenopathy:    He has no cervical adenopathy.  Neurological: He is alert and oriented to person, place, and time. He has normal reflexes. No  cranial nerve deficit. Coordination normal.  Skin: Skin is warm and dry. No rash noted. He is not diaphoretic. No erythema. No pallor.  Psychiatric: He has a normal mood and affect. His behavior is normal. Judgment and thought content normal.  Nursing note and vitals reviewed.   Vitals:   11/20/16 1035  BP: 136/60  Pulse: (!) 56  SpO2: 96%  Weight: 155 lb 9.6 oz (70.6 kg)  Height: 5\' 11"  (1.803 m)    Estimated body mass index is 21.7 kg/m as calculated from the following:   Height as of this encounter: 5\' 11"  (1.803 m).   Weight as of this encounter: 155 lb 9.6  oz (70.6 kg).      Assessment:       ICD-9-CM ICD-10-CM   1. COPD, moderate ([place]) 496 J44.9        Plan:      Stable disease  Plan Check alpha 1 genetic test for copd Continue bevespi - 2puff twice daily scheduled - take samples, price drug Use albuterol as needed Flu shot in fall Talk about new Shingarix vaccine for shingles with PCP Antony Contras, MD  Followup 9 months or sooner if needed  Dr. Brand Males, M.D., [facility].C.P Pulmonary and Critical Care Medicine Staff Physician [place] [facility] Pager: [phone], If no answer or between  15:00h - 7:00h: call [phone]  11/20/2016 10:54 AM

## 2016-11-23 LAB — ALPHA-1 ANTITRYPSIN PHENOTYPE: A1 ANTITRYPSIN: 154 mg/dL (ref 83–199)

## 2016-12-03 NOTE — Progress Notes (Signed)
Spoke with the pt's spouse and notified of results and she verbalized understanding and will inform the pt

## 2016-12-06 ENCOUNTER — Other Ambulatory Visit: Payer: Self-pay | Admitting: Emergency Medicine

## 2016-12-06 MED ORDER — GLYCOPYRROLATE-FORMOTEROL 9-4.8 MCG/ACT IN AERO: 2.0000 | INHALATION_SPRAY | Freq: Two times a day (BID) | RESPIRATORY_TRACT | 3 refills | Status: DC

## 2016-12-11 DIAGNOSIS — I509 Heart failure, unspecified: Secondary | ICD-10-CM | POA: Diagnosis not present

## 2016-12-24 ENCOUNTER — Encounter (HOSPITAL_COMMUNITY): Payer: Self-pay | Admitting: Emergency Medicine

## 2016-12-24 ENCOUNTER — Emergency Department (HOSPITAL_COMMUNITY)
Admission: EM | Admit: 2016-12-24 | Discharge: 2016-12-25 | Disposition: A | Discharge status: Home / Self Care | Payer: Medicare HMO | Attending: Emergency Medicine | Admitting: Emergency Medicine

## 2016-12-24 DIAGNOSIS — Z5321 Procedure and treatment not carried out due to patient leaving prior to being seen by health care provider: Secondary | ICD-10-CM | POA: Insufficient documentation

## 2016-12-24 DIAGNOSIS — K6289 Other specified diseases of anus and rectum: Secondary | ICD-10-CM | POA: Diagnosis present

## 2016-12-24 DIAGNOSIS — K5900 Constipation, unspecified: Secondary | ICD-10-CM | POA: Diagnosis not present

## 2016-12-24 NOTE — ED Triage Notes (Signed)
Pt from home with c/o rectal pain and constipation x 4 days. Pt states he has been taking miralax and colace for constipation with no relief. Pt states he has had similar symptoms which caused a rectal tear about 10 years ago. Pt denies bleeding or tear at current time.

## 2016-12-25 DIAGNOSIS — Z79899 Other long term (current) drug therapy: Secondary | ICD-10-CM | POA: Diagnosis not present

## 2016-12-25 DIAGNOSIS — R221 Localized swelling, mass and lump, neck: Secondary | ICD-10-CM | POA: Diagnosis not present

## 2016-12-25 DIAGNOSIS — K6289 Other specified diseases of anus and rectum: Secondary | ICD-10-CM | POA: Diagnosis not present

## 2016-12-25 DIAGNOSIS — K5900 Constipation, unspecified: Secondary | ICD-10-CM | POA: Diagnosis not present

## 2016-12-25 DIAGNOSIS — R195 Other fecal abnormalities: Secondary | ICD-10-CM | POA: Diagnosis not present

## 2016-12-27 ENCOUNTER — Other Ambulatory Visit: Payer: Self-pay | Admitting: Family Medicine

## 2016-12-27 DIAGNOSIS — R221 Localized swelling, mass and lump, neck: Secondary | ICD-10-CM

## 2016-12-28 ENCOUNTER — Other Ambulatory Visit: Payer: Medicare HMO

## 2017-01-01 DIAGNOSIS — K602 Anal fissure, unspecified: Secondary | ICD-10-CM | POA: Diagnosis not present

## 2017-01-02 ENCOUNTER — Telehealth: Payer: Self-pay | Admitting: Internal Medicine

## 2017-01-02 MED ORDER — GLYCOPYRROLATE-FORMOTEROL 9-4.8 MCG/ACT IN AERO
2.0000 | INHALATION_SPRAY | Freq: Two times a day (BID) | RESPIRATORY_TRACT | 0 refills | Status: DC
Start: 2017-01-02 — End: 2017-05-28

## 2017-01-02 MED ORDER — GLYCOPYRROLATE-FORMOTEROL 9-4.8 MCG/ACT IN AERO: 2.0000 | INHALATION_SPRAY | Freq: Two times a day (BID) | RESPIRATORY_TRACT | 11 refills | Status: DC

## 2017-01-02 NOTE — Telephone Encounter (Signed)
Spoke with pt's wife. She stated that the patient had turned his paperwork for the [facility] patient assistance program back in March and they have not heard anything about receiving medication.  She stated she had mentioned this to MR during his OV in May and MR was surprised that he had not received anything.   Called [employer], found out the form was never faxed to them. Looked in patient's chart. Patient was given the wrong forms. Advised his wife that will leave 3 samples up front as well as the correct paperwork. She verbalized understanding. Nothing else needed at time of call.   Forms and RX have been printed. MR will need to sign these before faxing. Will route to elise for follow up.

## 2017-01-03 ENCOUNTER — Other Ambulatory Visit: Payer: Medicare HMO

## 2017-01-03 ENCOUNTER — Telehealth: Payer: Self-pay | Admitting: Internal Medicine

## 2017-01-03 MED ORDER — ALBUTEROL SULFATE HFA 108 (90 BASE) MCG/ACT IN AERS: 2.0000 | INHALATION_SPRAY | Freq: Four times a day (QID) | RESPIRATORY_TRACT | 5 refills | Status: DC | PRN

## 2017-01-03 NOTE — Telephone Encounter (Signed)
We do not have samples of Ventolin. Pt is aware. I offered to send in prescription for this. Pt agreed. Rx has been sent in. Nothing further was needed.

## 2017-01-07 NOTE — Telephone Encounter (Signed)
Pt picked up samples and forms on 7/12. Will sign off on message. When pt returns form a new phone note will be opened.

## 2017-01-10 DIAGNOSIS — I509 Heart failure, unspecified: Secondary | ICD-10-CM | POA: Diagnosis not present

## 2017-01-14 ENCOUNTER — Other Ambulatory Visit: Payer: Self-pay | Admitting: *Deleted

## 2017-01-14 ENCOUNTER — Other Ambulatory Visit: Payer: Self-pay | Admitting: Cardiology

## 2017-01-14 NOTE — Telephone Encounter (Signed)
Called and spoke with [facility] and they did state got refill in May for 3 months supply with 1 refill but pt was notified copay was 320 dollars and they never received call back from pt so this order was cancelled  Called and spoke with pt and he states that could not afford that amount and wanted called in to [place]. Pt instructed will call in for only 1 month to see if affordable Pt instructed that he cannot be with an anticoagulant and if cannot afford the Eliquis to call Dr Radford Pax to get something else ordered and he states understanding  Wt 70.6kg  11/20/2016 Age 76 years 09/06/2016  Saw Dr Radford Pax 09/20/2016 Hgb 11.0 HCT 33.1 09/20/2016 SrCr 1.52 Refill done for Eliquis 5mg  q 12 hours

## 2017-01-25 ENCOUNTER — Telehealth: Payer: Self-pay | Admitting: Cardiology

## 2017-01-25 NOTE — Telephone Encounter (Signed)
Pt's wife calling requesting samples of Eliquis 5 mg tablets. I informed her that I would leave 3 weeks supply and assistance forms at the front desk for pt to pick up and fill out and bring back so we can try and get the pt some assistance with getting his medication. Pt's wife verbalized understanding. FYI

## 2017-01-29 NOTE — Telephone Encounter (Signed)
The pt dropped off his [place] pt assistance application in the front office today. A print out from the pts pharmacy showing how much money he has paid out of pocket on his medications was not included.  I called a s/w the pts wife [name]. [name] states that they did not realize they needed that statement and that she will contact both [facility] and [facility] pharmacies to get that information.  She is aware that I will wait until they bring the pharmacy document in before I fax in application.

## 2017-01-30 ENCOUNTER — Other Ambulatory Visit: Payer: Self-pay

## 2017-01-30 MED ORDER — APIXABAN 5 MG PO TABS: 5.0000 mg | ORAL_TABLET | Freq: Two times a day (BID) | ORAL | 3 refills | Status: DC

## 2017-02-10 DIAGNOSIS — I509 Heart failure, unspecified: Secondary | ICD-10-CM | POA: Diagnosis not present

## 2017-02-26 ENCOUNTER — Ambulatory Visit: Payer: Medicare HMO | Admitting: Cardiology

## 2017-03-01 ENCOUNTER — Encounter: Payer: Self-pay | Admitting: Cardiology

## 2017-03-13 DIAGNOSIS — I509 Heart failure, unspecified: Secondary | ICD-10-CM | POA: Diagnosis not present

## 2017-04-07 IMAGING — CT CT ANGIO CHEST
2 of 8 series · 19 of 46 positions shown · IV contrast (ISOVUE 370)
Comparison: None.

CLINICAL DATA: Hypertension.  Assess for chronic pulmonary embolus.

EXAM:
CT ANGIOGRAPHY CHEST WITH CONTRAST
TECHNIQUE: Multidetector CT imaging of the chest was performed using the
standard protocol during bolus administration of intravenous
contrast. Multiplanar CT image reconstructions and MIPs were
obtained to evaluate the vascular anatomy.
CONTRAST:  65 cc of Isovue 370

[Series 5: thins · axial · 0.63mm/px · z∈[-257,-18]mm · 16 of 269 slices shown]
[im 15/269  lung]
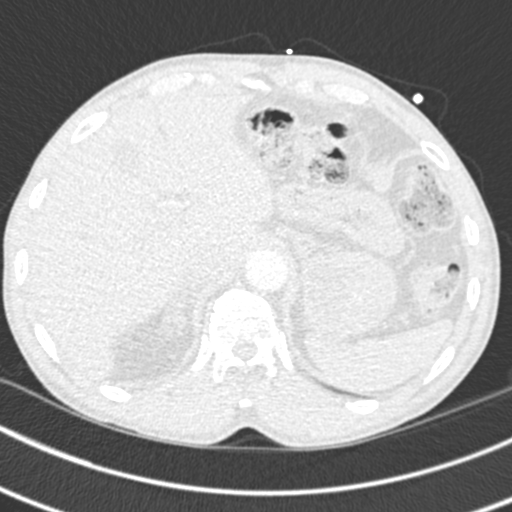
[im 30/269  soft-tissue]
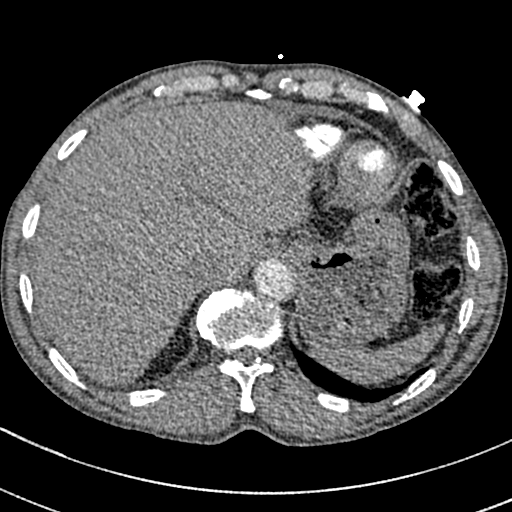
[im 45/269  lung]
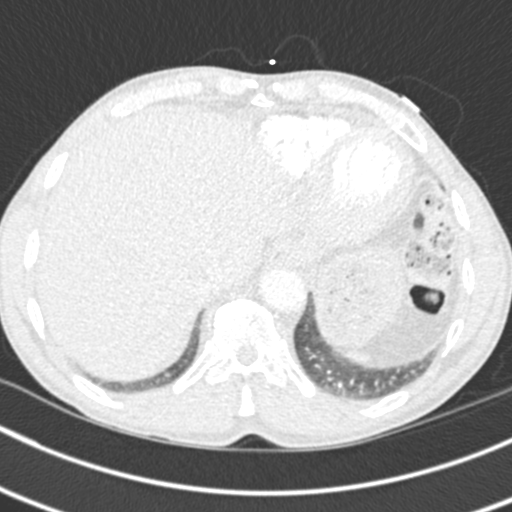
[im 60/269  soft-tissue]
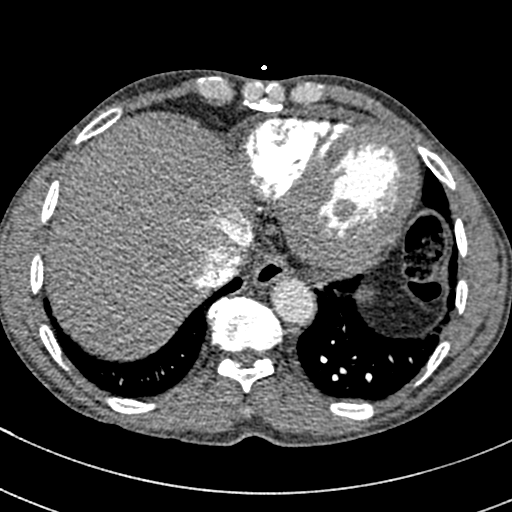
[im 75/269  lung]
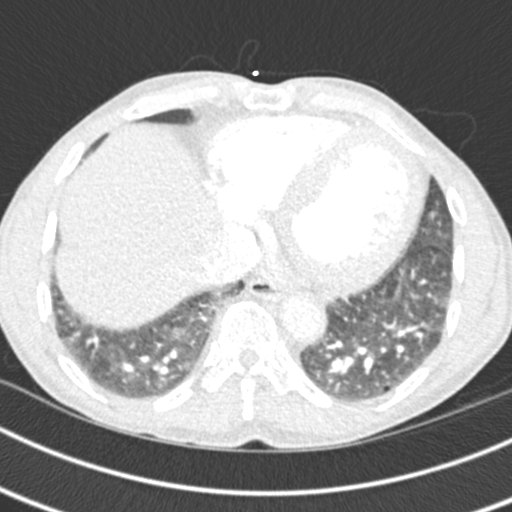
[im 90/269  soft-tissue]
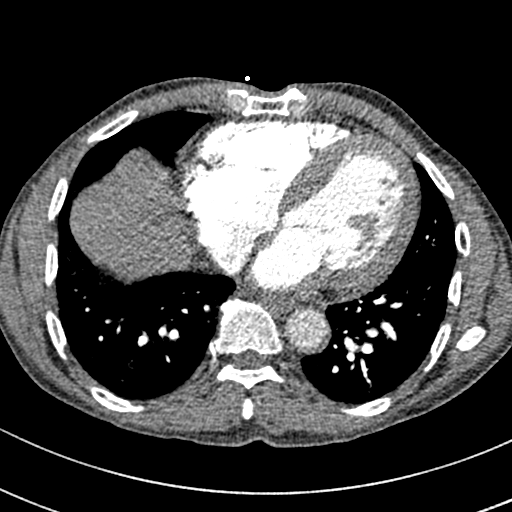
[im 105/269  lung]
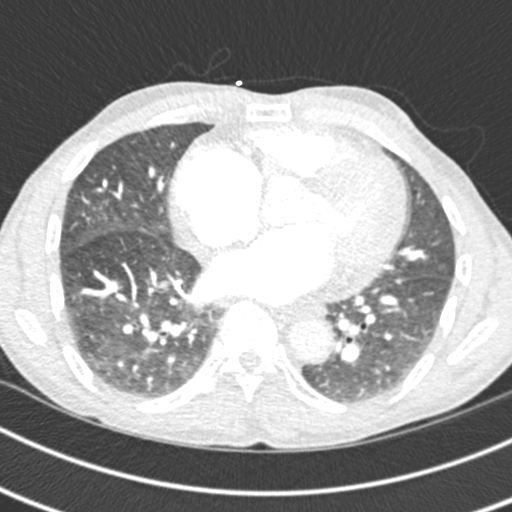
[im 120/269  soft-tissue]
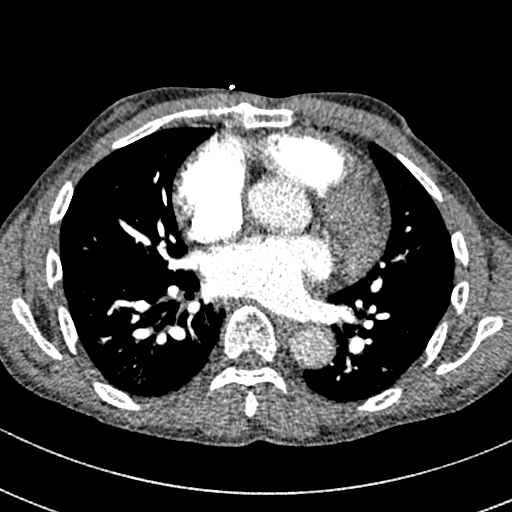
[im 149/269  lung]
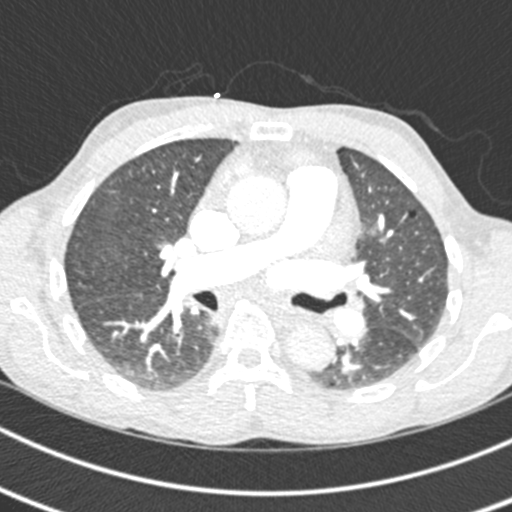
[im 164/269  soft-tissue]
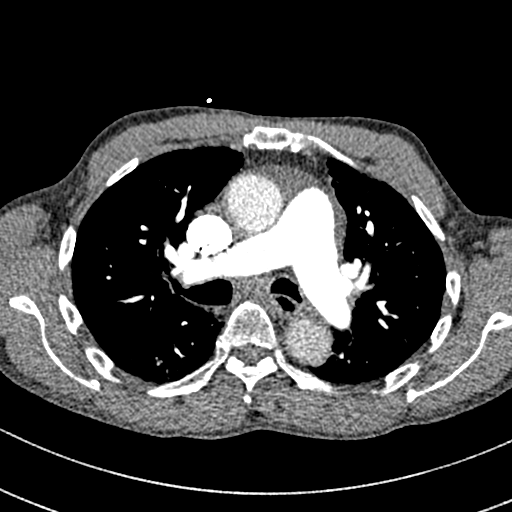
[im 179/269  lung]
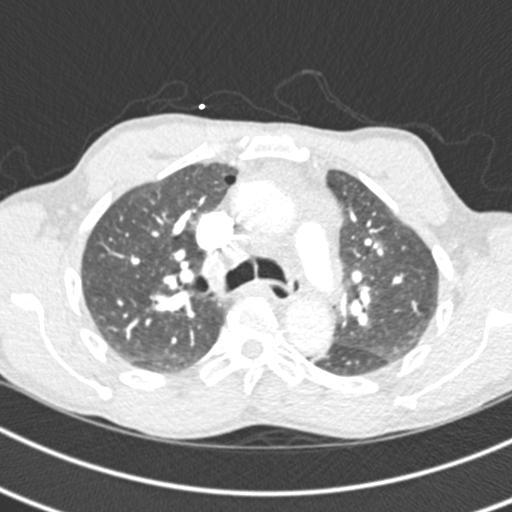
[im 194/269  soft-tissue]
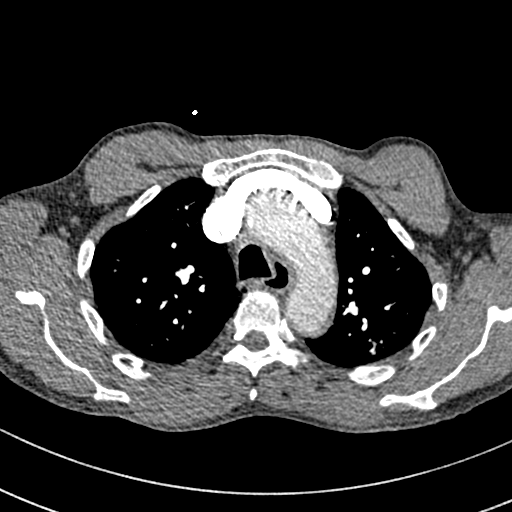
[im 209/269  lung]
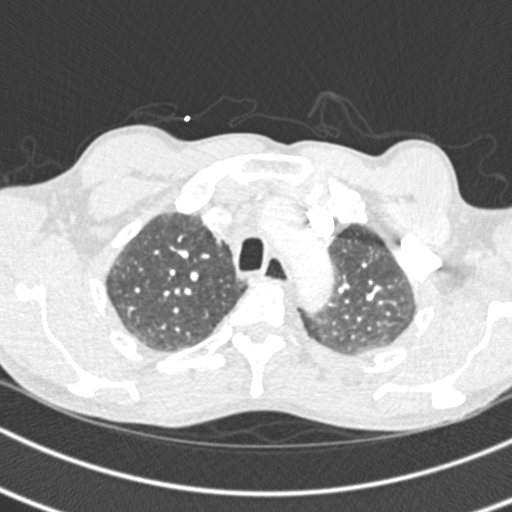
[im 224/269  soft-tissue]
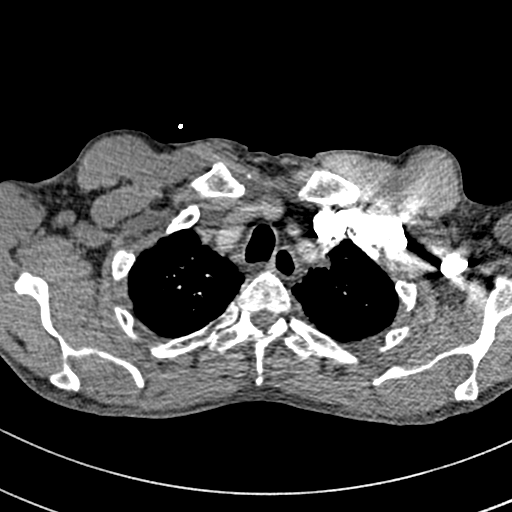
[im 239/269  lung]
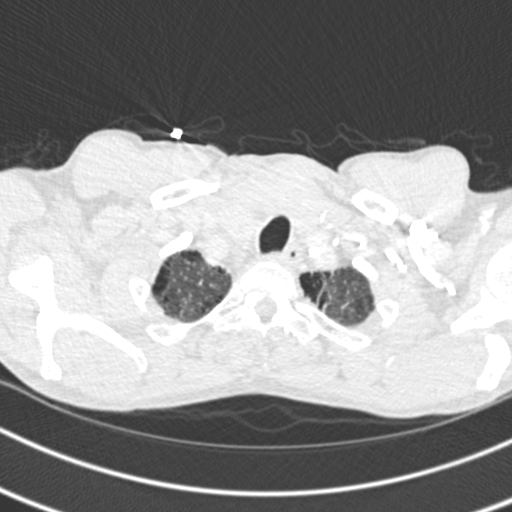
[im 254/269  soft-tissue]
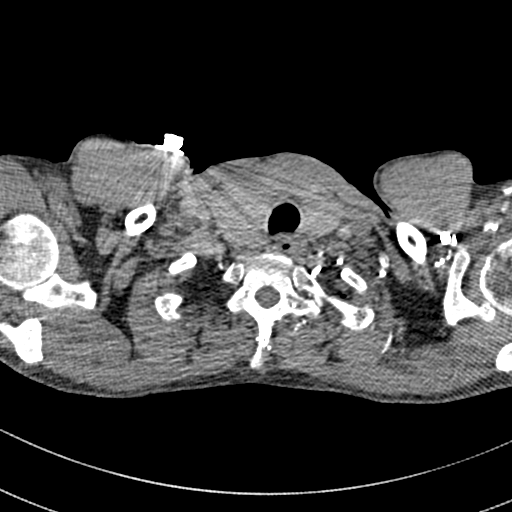

[Series 7: coronal mpr · coronal · 0.59mm/px · 3 of 108 slices shown]
[im 27/108  soft-tissue]
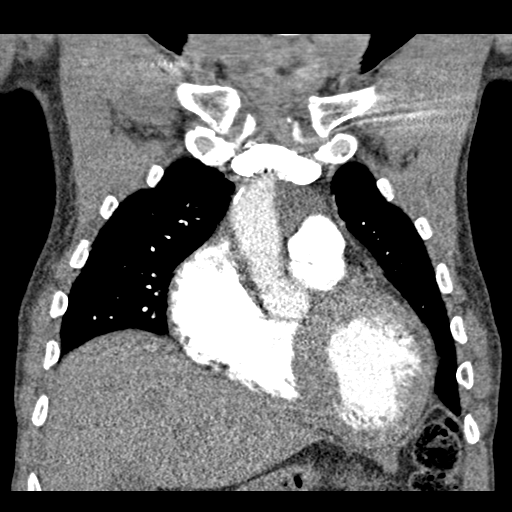
[im 54/108  soft-tissue]
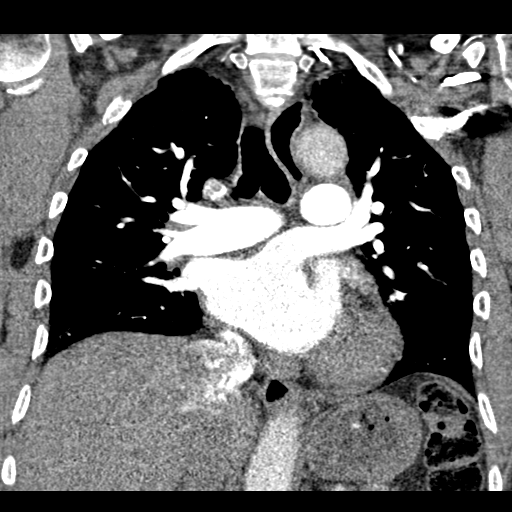
[im 81/108  soft-tissue]
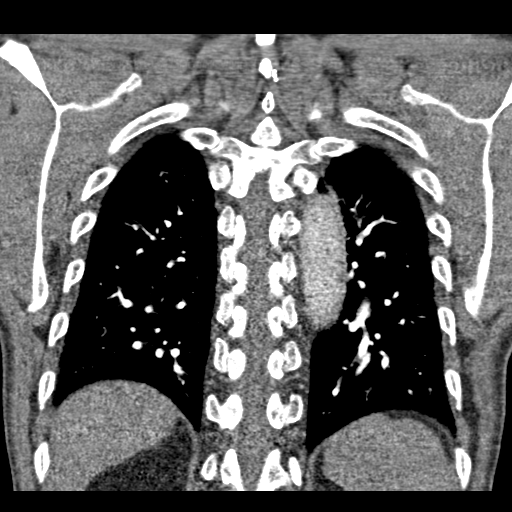

[19 of 46 positions shown; findings below may reference images not displayed]

FINDINGS: Mediastinum: Moderate cardiac enlargement. There is no pericardial
effusion. The trachea appears patent and is midline. Unremarkable
appearance of the esophagus. Thyroid gland enlargement noted. The
main pulmonary artery is patent. No lobar or segmental pulmonary
artery filling defects are identified.

Lungs/Pleura: No pleural effusion. Paraseptal emphysema noted. There
is a ground-glass attenuating nodule in the left upper lobe
measuring 1.6 cm, image 37 of series 6.

Upper Abdomen: No focal liver abnormality. Visualized portions of
the spleen and pancreas are unremarkable.

Musculoskeletal: Mild spondylosis is identified within the thoracic
spine.

Review of the MIP images confirms the above findings.
IMPRESSION: 1. No evidence for acute or chronic pulmonary embolus.
2. Left upper lobe ground-glass nodule is identified measuring
cm. Initial follow-up by chest CT without contrast is recommended in
3 months to confirm persistence. This recommendation follows the
consensus statement: Recommendations for the Management of Subsolid
Pulmonary Nodules Detected at CT: A Statement from the [place]
3. Emphysema.

## 2017-04-12 ENCOUNTER — Telehealth: Payer: Self-pay

## 2017-04-12 DIAGNOSIS — I509 Heart failure, unspecified: Secondary | ICD-10-CM | POA: Diagnosis not present

## 2017-04-12 NOTE — Telephone Encounter (Signed)
**Note De-Identified Kalika Smay Obfuscation** The pts wife brought in a [employer] pt assistance application for Eliquis. I have completed the provider part of the application and placed it along with an Eliquis RX in Dr Landis Gandy mail bin awaiting her signature.  Also, I have asked the pts wife to ask their pharmacy to fax [place] an out of pocket expense report showing how much he has paid on medications this year. The pts wife does have our fax number.

## 2017-04-12 NOTE — Telephone Encounter (Signed)
The pts wife left her insurance info and not the pts. I called her and advised her that I need the pts insurance card scanned into his chart and a report from the pharmacy showing how much money he has paid out of pocket on his medications so I can fax all to [facility] once Dr Radford Pax signs the application and RX for Eliquis.Marland Kitchen

## 2017-04-16 NOTE — Telephone Encounter (Signed)
**Note De-Identified [name] Obfuscation** I received an out of pocket expense report from the pts pharmacy Robert Gardner fax. Waiting on insurance information that the pts wife states she will bring to the office early this week.

## 2017-05-13 DIAGNOSIS — I509 Heart failure, unspecified: Secondary | ICD-10-CM | POA: Diagnosis not present

## 2017-05-28 ENCOUNTER — Telehealth: Payer: Self-pay | Admitting: Internal Medicine

## 2017-05-28 MED ORDER — ALBUTEROL SULFATE HFA 108 (90 BASE) MCG/ACT IN AERS: 2.0000 | INHALATION_SPRAY | Freq: Four times a day (QID) | RESPIRATORY_TRACT | 3 refills | Status: AC | PRN

## 2017-05-28 MED ORDER — GLYCOPYRROLATE-FORMOTEROL 9-4.8 MCG/ACT IN AERO: 2.0000 | INHALATION_SPRAY | Freq: Two times a day (BID) | RESPIRATORY_TRACT | 3 refills | Status: AC

## 2017-05-28 NOTE — Telephone Encounter (Signed)
Called pt verifying he was needing a 90-day supply of the ventolin and bevespi sent to [facility]. Pt verbalized with me that this was correct. Sent a 90-day supply of both meds to [facility] for pt. Nothing further needed.

## 2017-06-07 ENCOUNTER — Telehealth: Payer: Self-pay

## 2017-06-07 NOTE — Telephone Encounter (Signed)
Application for patient assistance for Eliquis faxed to [employer] today.

## 2017-06-12 DIAGNOSIS — I509 Heart failure, unspecified: Secondary | ICD-10-CM | POA: Diagnosis not present

## 2017-06-13 ENCOUNTER — Telehealth: Payer: Self-pay | Admitting: *Deleted

## 2017-06-13 NOTE — Telephone Encounter (Signed)
Received fax from [facility] stating they have received the patients application for assistance with his ELIQUIS. They will review application and respond within 2 business days with a decision.

## 2017-06-19 ENCOUNTER — Telehealth: Payer: Self-pay | Admitting: Internal Medicine

## 2017-06-19 NOTE — Telephone Encounter (Signed)
Called and spoke with both pt and his wife, Robert Gardner letting them know we needed to get him scheduled for a f/u OV with MR and then when he came for the visit, we could walk him on RA to make sure he did not need his O2.  Pt and wife, Robert Gardner both expressed understanding. OV scheduled with MR Wednesday, 2/6 at 9:00. Nothing further needed.

## 2017-07-13 DIAGNOSIS — I509 Heart failure, unspecified: Secondary | ICD-10-CM | POA: Diagnosis not present

## 2017-07-31 ENCOUNTER — Encounter: Payer: Self-pay | Admitting: Internal Medicine

## 2017-07-31 ENCOUNTER — Ambulatory Visit (INDEPENDENT_AMBULATORY_CARE_PROVIDER_SITE_OTHER)
Admission: RE | Admit: 2017-07-31 | Discharge: 2017-07-31 | Disposition: A | Discharge status: Home / Self Care | Payer: Medicare HMO | Source: Ambulatory Visit | Attending: Internal Medicine | Admitting: Internal Medicine

## 2017-07-31 ENCOUNTER — Ambulatory Visit: Payer: Medicare HMO | Admitting: Internal Medicine

## 2017-07-31 VITALS — BP 120/58 | HR 62 | Ht 71.0 in | Wt 152.0 lb

## 2017-07-31 DIAGNOSIS — R52 Pain, unspecified: Secondary | ICD-10-CM

## 2017-07-31 DIAGNOSIS — J449 Chronic obstructive pulmonary disease, unspecified: Secondary | ICD-10-CM | POA: Diagnosis not present

## 2017-07-31 DIAGNOSIS — R079 Chest pain, unspecified: Secondary | ICD-10-CM | POA: Diagnosis not present

## 2017-07-31 NOTE — Patient Instructions (Addendum)
COPD, moderate ([place]) - Plan: Ambulatory Referral for DME  - take lot of protein - continue bevespi 2 puff twice daily - dc portable o2 - use albuterol as needed  - Please talk to PCP Antony Contras, MD -  and ensure you get  shingrix vaccine   Electric shock-type pain  - not sure what this is  - get CXR 2 view 07/31/2017 - if clear talk to PCP Antony Contras, MD about this  Followup  9 months or sooner if needed

## 2017-07-31 NOTE — Progress Notes (Signed)
Subjective:     Patient ID: Robert Gardner, male   DOB: January 18, 1941, 77 y.o.   MRN: [id number]  HPI  former smoker followed for Moderate COPD/Emphysema  Combined diastolic systolic congestive heart failure Atrial  fibrillation on Eliquis  TEST  11/18/15:  Full pulmonary function test 11/18/2015 shows FVC 2.23 L/58%. FEV1 1.82 L/64% with a ratio of 82. There is no broncho-dilator response. DLCO is 13.4/41% and significantly reduced. Overall restriction with low DLCO. 01/11/2016: Walking desaturation test: Robert Gardner from 99% to 94%.. Stop at the end of 2 laps due to lightheadedness. This was on room air. He did not complete his third lap.  01/11/2016: CBC shows hemoglobin of 10.3 g percent and this is improved since postoperative blood loss in May 2017.  01/13/2016: High-resolution CT chest personally visualized negative findings. He has bilateral apical emphysema associated with air trapping. He also has evidence of pulmonary hypertension. There is no fibrotic lung disease or nodules. Previously on glass opacities have resolved.  03/29/2016 acute office visit Patient returns for work in visit. Patient was seen in July for COPD. Patient was found to have emphysema on his high resolution CT chest with no evidence of interstitial lung disease. PFT showed restrictive lung disease with an FEV1 at 64% and a ratio of 82. DLCO was decreased at 41%. Walk test w/ no desats in office.  Patient was started on Incruse . Patient says that shortly after starting this inhaler that he noticed that he had enlarged right breast tissue. Patient was seen by his primary care physician and set up for a mammogram.  This was done on 03/01/2016 that showed no concerning masses. Felt to have right gynecomastia. Medication review was done, denies use of Aldactone or finasteride.. Explained to him and his wife that it would be uncommon for increased to have this particular side effect. She is on a calcium channel blocker with  amlodipine, for a long time. Calcium channel blockers can cause gynecomastia. She denies any chest pain, orthopnea, PND, or increased leg swelling. He declines a flu shot.   OV 04/24/2016  Chief Complaint  Patient presents with  . Follow-up    Pt states since seeing TP on 10.5.17 he does not feel back to baseline. Pt c/o increase in SOB, dry cough, right sided chest discomfort. Pt denies f/c/s.     Follow-up moderate COPD with chronic systolic and diastolic heart failure  I personally saw him in July 2017 at which time dyspnea is been deemed due to COPD, anemia and posthospitalization fatigue and deconditioning and chronic systolic and diastolic heart failure. I put him on long-acting anticholinergic manufactured by [employer]. Sometime after that he was diagnosed with gynecomastia so he wanted to change even though this was not the causative agent. He was started on Spiriva one follow-up visit earlier this month which he says is only helping somewhat. He still has significant fatigue and deconditioning. He is only somewhat improved since his hospitalization earlier this year.Review of the labs show that he is still anemic and as of last week his creatinine is suddenly increased 1.8 mg percent. He feels is not nervous situation where he can work anymore. He is not having any active bleeding   OV 08/20/2016  Chief Complaint  Patient presents with  . Follow-up    Pt states he feels he is doing well - states he has noticed an improvement since taking the bevespi. Pt c/o nonprod cough and occ midsternal chest pain.    Follow-up  moderate COPD with chronic systolic and diastolic heart failure  Last seen October 2017. He presents with his wife Robert Gardner . Overall he is feeling stable. He likes [employer] dual inhaler long-acting beta agonist glycopyrrolate and long-acting inhaled steroid budesonide (BEVESPII). He is not in COPD exacerbation. His fatigue is improved. He does have some  occasional nonspecific midsternal chest pain. He has an upcoming appointment with her. He is concerned about his right breast lump. In September 2017 he had a mammogram and apparently he was reassured. However they're extremely concerned about this and is requesting a referral.   OV 11/20/2016  Chief Complaint  Patient presents with  . Follow-up    Pt states his breathing is doing well. Pt states his SOB is at baseline. Pt denies cough and CP/tightness.    Follow-up moderate COPD in the setting of chronic systolic and diastolic heart failure.  Last seen February 2018. At that point in time he is concerned about a breast lump. In April 2018 he did have biopsy of this on the right side. I visualized and review the report. It is not malignancy. It is gynecomastia. Terms of his COPD is stable. He really likes the inhaler BEVESPI . He takes it 2 puff 2 times daily. He uses albuterol for rescue but has not needed it since last visit but he always carries it in the pocket. He has not priced the inhaler BEVESPI as yet   OV 07/31/2017  Chief Complaint  Patient presents with  . Follow-up    Pt states he has been doing okay. Pt's wife states pt was having some CP yesterday morning that lasted about 2 hours and also has complaints of cough and SOB.    FU Mod CPD in setting of chf systolic/diast   77 year old male with moderate COPD and chronic systolic diastolic heart failure.  He presents for routine follow-up.  No interim exacerbations.  Feels well.  Wife feels that he is slowly losing weight because of poor appetite.  He plans to increase his protein intake.  The only new issue is that he is describing him some kind of a funny feeling in his chest for the last few months.  He feels like it is an electric current running through different parts of his chest randomly and lasts a few seconds to a few minutes and then subsides.  It is not exertional chest pain.  There is occasional twitching of the muscle  fibers in his upper extremities.  Otherwise no fever no hemoptysis or sputum production.  He is ineligible for lung cancer low-dose CT scanning program because it has been more than 15 years since he quit smoking.  But higher CT in 2017 did not show any mass    has a past medical history of Arthritis, Atrial flutter ([place]), Bell palsy (2013), Cancer ([place]), CHF (congestive heart failure) ([place]), CKD (chronic kidney disease), stage II, Combined systolic and diastolic heart failure ([place]), Erectile dysfunction, Hypertension, Mitral regurgitation, Mobitz (type) I (Wenckebach's) atrioventricular block, Normocytic anemia, Second degree AV block, Mobitz type I (09/08/2015), Shortness of breath dyspnea, and Thyroid enlargement.   reports that he quit smoking about 23 years ago. His smoking use included cigarettes. He has a 42.00 pack-year smoking history. he has never used smokeless tobacco.  Past Surgical History:  Procedure Laterality Date  . ABLATION  11-11-2013   CTI ablation by Dr Lovena Le  . ATRIAL FLUTTER ABLATION N/A 11/11/2013   Procedure: ATRIAL FLUTTER ABLATION;  Surgeon: Evans Lance, MD;  Location: [facility] CATH [facility];  Service: Cardiovascular;  Laterality: N/A;  . BREAST LUMPECTOMY Right 09/26/2016   Procedure: RIGHT BREAST LUMPECTOMY;  Surgeon: Coralie Keens, MD;  Location: [facility];  Service: General;  Laterality: Right;  . CARDIAC CATHETERIZATION N/A 10/05/2015   Procedure: Left Heart Cath and Coronary Angiography;  Surgeon: Troy Sine, MD;  Location: [facility] CV [facility];  Service: Cardiovascular;  Laterality: N/A;  . CYST REMOVAL TRUNK     like a cyst  . Rectal tear     repair  . Resection of melanoma Right    Leg  . TOTAL KNEE ARTHROPLASTY Right 10/31/2015   Procedure: RIGHT TOTAL KNEE ARTHROPLASTY;  Surgeon: Frederik Pear, MD;  Location: [facility];  Service: Orthopedics;  Laterality: Right;    Allergies  Allergen Reactions  . No Known Allergies     Immunization History  Administered Date(s)  Administered  . Pneumococcal Polysaccharide-23 01/11/2015    Family History  Problem Relation Age of Onset  . Heart disease Mother        unclear details  . Heart disease Father        unclear details  . Heart disease Sister        unclear details     Current Outpatient Medications:  .  acetaminophen (TYLENOL) 500 MG tablet, Take 1,000 mg by mouth every 6 (six) hours as needed for mild pain., Disp: , Rfl:  .  albuterol (PROVENTIL HFA;VENTOLIN HFA) 108 (90 Base) MCG/ACT inhaler, Inhale 2 puffs into the lungs every 6 (six) hours as needed for wheezing or shortness of breath., Disp: 3 Inhaler, Rfl: 3 .  amLODipine (NORVASC) 5 MG tablet, TAKE ONE TABLET BY MOUTH ONCE DAILY, Disp: 180 tablet, Rfl: 3 .  apixaban (ELIQUIS) 5 MG TABS tablet, Take 1 tablet (5 mg total) by mouth 2 (two) times daily., Disp: 180 tablet, Rfl: 3 .  Glycopyrrolate-Formoterol (BEVESPI AEROSPHERE) 9-4.8 MCG/ACT AERO, Inhale 2 puffs into the lungs 2 (two) times daily., Disp: 3 Inhaler, Rfl: 3 .  lisinopril (PRINIVIL,ZESTRIL) 10 MG tablet, Take by mouth., Disp: , Rfl:  .  Multiple Vitamin (MULTIVITAMIN WITH MINERALS) TABS tablet, Take 1 tablet by mouth daily., Disp: , Rfl:  .  OVER THE COUNTER MEDICATION, Take 1,000 mg by mouth daily. [place] Natural Multivitamin Boosts Immunity  Promotes Weight Loss Reduces Stress, Disp: , Rfl:  .  potassium chloride SA (K-DUR,KLOR-CON) 20 MEQ tablet, Take 1 tablet (20 mEq total) by mouth every other day. Take every other day with Lasix. (Patient taking differently: Take 20 mEq by mouth every other day. ), Disp: 15 tablet, Rfl: 11 .  torsemide (DEMADEX) 20 MG tablet, Take 10 mg by mouth daily., Disp: , Rfl:  .  torsemide (DEMADEX) 20 MG tablet, Take 0.5 tablets (10 mg total) by mouth daily., Disp: 30 tablet, Rfl: 9   Review of Systems     Objective:   Physical Exam  Constitutional: He is oriented to person, place, and time. He appears well-developed and well-nourished. No distress.   HENT:  Head: Normocephalic and atraumatic.  Right Ear: External ear normal.  Left Ear: External ear normal.  Mouth/Throat: Oropharynx is clear and moist. No oropharyngeal exudate.  Eyes: Conjunctivae and EOM are normal. Pupils are equal, round, and reactive to light. Right eye exhibits no discharge. Left eye exhibits no discharge. No scleral icterus.  Neck: Normal range of motion. Neck supple. No JVD present. No tracheal deviation present. No thyromegaly present.  Cardiovascular: Normal rate, regular rhythm and  intact distal pulses. Exam reveals no gallop and no friction rub.  No murmur heard. Pulmonary/Chest: Effort normal and breath sounds normal. No respiratory distress. He has no wheezes. He has no rales. He exhibits no tenderness.  brrell chest Mild purse lip  Abdominal: Soft. Bowel sounds are normal. He exhibits no distension and no mass. There is no tenderness. There is no rebound and no guarding.  Musculoskeletal: Normal range of motion. He exhibits no edema or tenderness.  Lymphadenopathy:    He has no cervical adenopathy.  Neurological: He is alert and oriented to person, place, and time. He has normal reflexes. No cranial nerve deficit. Coordination normal.  Skin: Skin is warm and dry. No rash noted. He is not diaphoretic. No erythema. No pallor.  Psychiatric: He has a normal mood and affect. His behavior is normal. Judgment and thought content normal.  Nursing note and vitals reviewed.  Vitals:   07/31/17 0921  BP: (!) 120/58  Pulse: 62  SpO2: 98%    Estimated body mass index is 21.88 kg/m as calculated from the following:   Height as of 12/24/16: 5\' 11"  (1.803 m).   Weight as of 12/24/16: 156 lb 14.4 oz (71.2 kg).     Assessment:       ICD-10-CM   1. COPD, moderate ([place]) J44.9 Ambulatory Referral for DME  2. Electric shock-type pain R52        Plan:     COPD, moderate ([place]) - Plan: Ambulatory Referral for DME  - take lot of protein - continue bevespi 2 puff  twice daily - dc portable o2 - use albuterol as needed  - Please talk to PCP Antony Contras, MD -  and ensure you get  shingrix vaccine   Electric shock-type pain  - not sure what this is  - get CXR 2 view 07/31/2017 - if clear talk to PCP Antony Contras, MD about this  Followup  9 months or sooner if needed    Dr. Brand Males, M.D., [facility].C.P Pulmonary and Critical Care Medicine Staff Physician, [facility] Director - Interstitial Lung Disease  Program  [facility] [place], [place], [address]  Pager: [phone], If no answer or between  15:00h - 7:00h: call [phone] Telephone: [phone]

## 2017-07-31 NOTE — Addendum Note (Signed)
Addended by: Elton Sin on: 07/31/2017 10:02 AM   Modules accepted: Orders

## 2017-09-04 ENCOUNTER — Other Ambulatory Visit (HOSPITAL_COMMUNITY): Payer: 59

## 2017-09-19 ENCOUNTER — Ambulatory Visit: Payer: Medicare HMO | Admitting: Cardiology

## 2018-01-07 ENCOUNTER — Telehealth: Payer: Self-pay | Admitting: Internal Medicine

## 2018-01-07 NOTE — Telephone Encounter (Signed)
Attempted to contact pt. I did not receive an answer. There was no option for me to leave a message. Will try back.  

## 2018-01-09 NOTE — Telephone Encounter (Signed)
Attempted to contact pt. I did not receive an answer. There was no option for me to leave a message. Will try back.  

## 2018-01-10 NOTE — Telephone Encounter (Signed)
Return call to Patient.  Patient states MR discontinued his O2 in February and he feels he needs it at this time. He wants to see if he can qualify for POC.  Next MR OV is 04/2018.  Patient scheduled qualifying walk 01/13/18 at 1000.  Nothing further at this time.

## 2018-01-13 ENCOUNTER — Ambulatory Visit (INDEPENDENT_AMBULATORY_CARE_PROVIDER_SITE_OTHER): Payer: Medicare HMO | Admitting: *Deleted

## 2018-01-13 DIAGNOSIS — R0600 Dyspnea, unspecified: Secondary | ICD-10-CM | POA: Diagnosis not present

## 2018-01-13 DIAGNOSIS — R0689 Other abnormalities of breathing: Secondary | ICD-10-CM

## 2018-01-26 ENCOUNTER — Encounter: Payer: Self-pay | Admitting: Physician Assistant

## 2018-01-26 NOTE — Progress Notes (Addendum)
Cardiology Office Note    Date:  01/27/2018  ID:  Robert Gardner, DOB [birth year], MRN [id number] PCP:  Robert Contras, MD  Cardiologist:  Robert Him, MD   Chief Complaint: 6 month f/u atrial flutter, CHF  History of Present Illness:  Robert Gardner is a 77 y.o. male with history of paroxysmal atrial flutter s/p ablation, remotely diagnosed atrial fib (details unclear), chronic combined congestive heart failure, mild mitral regurgitation, hypertension, CKD III, history of Mobitz 1 2nd degree AVB (by monitor 2014), pulm HTN, normocytic anemia, Bells palsy, ED who presents for 6 month follow-up. I discharged Gardner in 2015 when he was admited for newly recognized systolic dysfunction/CHF with EF 45% and new onset atrial flutter. He underwent ablation of atrial flutter in 2015. Outpt nuclear stress test 11/2013 with no ischemia. In 2017, a nuclear stress test was done for pre-op eval for knee replacement and dyspnea. This was abnormal and prompted cath 09/2015 showing minimal plaque (10% mid-distal RCA), EF 40-45%, LVEDP 54mmHg. Most recent echo 08/2016 showed normal LV function, mild MR, mild LAE, mild TR/PR, mild pulm HTN. He's followed by pulm for COPD (emphysema on high res CT). Last labs 08/2016 with Hgb 11.0, Cr 1.52 c/w prior. Persistent atrial fibrillation is also listed in his chart but it is unclear to me where this diagnosis comes from as most notes carry forward that atrial flutter resolved post-ablation and he's been maintaining NSR/Wenckebach with PVCs by most recent notes. Addendum: I reviewed with Dr. Radford Pax and this diagnosis pre-dates his diagnosis of atrial flutter.  He presents today for 6 month follow-up but also to report a few symptoms. He is accompanied by his daughter and wife who help to fill in on recent events as he is somewhat of a vague historian.  - Father's Day weekend (on a hot weekend) he and his family were driving from [place] and stopped at a rest stop. He was walking to  the car and felt his legs were rubbery like he was going to fall. No dizziness, palpitations, chest pain beforehand but did feel somewhat SOB. Daughter's husband caught Gardner and helped keep Gardner upright. Got Gardner to the car and he then felt fine. No prior event like this and has not had any since. - Reports intermittent chest pain for the last 6 months, vague description, happens primarily when getting in and out of the bathtub and can last all day up until 5-6 pm without any specific aggravating or relieving factors otherwise. Not specifically worse with exertion. Chronic dyspnea unchanged since diagnosis of emphysema. - Weight loss - was 165lb in 2017, now 147lb. Unintentional. Quit smoking 1990's. Path for gynecomastia in 2018 was negative for malignancy. He feels well today.  Past Medical History:  Diagnosis Date  . Arthritis   . Atrial flutter ([place])    s/p flutter ablation  . Bell palsy 2013  . Cancer ([place])    skin cancer right leg-melanoma  . Chronic combined systolic and diastolic CHF (congestive heart failure) ([place])   . CKD (chronic kidney disease), stage III ([place])   . Combined systolic and diastolic heart failure ([place])    EF 45% by echo 08/2014.  . Erectile dysfunction   . Hypertension   . Mitral regurgitation    Moderate by echo 08/2014  . Mobitz (type) I (Wenckebach's) atrioventricular block    a. By Holter 10/2012.  Marland Kitchen NICM (nonischemic cardiomyopathy) ([place])   . Normocytic anemia   . Second degree AV block, Mobitz type I  09/08/2015  . Thyroid enlargement    a. Abnl xray 09/2013 - possible R thyroid enlargement.     Past Surgical History:  Procedure Laterality Date  . ABLATION  11-11-2013   CTI ablation by Dr Lovena Le  . ATRIAL FLUTTER ABLATION N/A 11/11/2013   Procedure: ATRIAL FLUTTER ABLATION;  Surgeon: Evans Lance, MD;  Location: [facility] CATH LAB;  Service: Cardiovascular;  Laterality: N/A;  . BREAST LUMPECTOMY Right 09/26/2016   Procedure: RIGHT BREAST LUMPECTOMY;  Surgeon:  Coralie Keens, MD;  Location: [facility];  Service: General;  Laterality: Right;  . CARDIAC CATHETERIZATION N/A 10/05/2015   Procedure: Left Heart Cath and Coronary Angiography;  Surgeon: Troy Sine, MD;  Location: [facility] CV LAB;  Service: Cardiovascular;  Laterality: N/A;  . CYST REMOVAL TRUNK     like a cyst  . Rectal tear     repair  . Resection of melanoma Right    Leg  . TOTAL KNEE ARTHROPLASTY Right 10/31/2015   Procedure: RIGHT TOTAL KNEE ARTHROPLASTY;  Surgeon: Frederik Pear, MD;  Location: [facility];  Service: Orthopedics;  Laterality: Right;    Current Medications: Current Meds  Medication Sig  . acetaminophen (TYLENOL) 500 MG tablet Take 1,000 mg by mouth every 6 (six) hours as needed for mild pain.  Marland Kitchen albuterol (PROVENTIL HFA;VENTOLIN HFA) 108 (90 Base) MCG/ACT inhaler Inhale 2 puffs into the lungs every 6 (six) hours as needed for wheezing or shortness of breath.  Marland Kitchen amLODipine (NORVASC) 5 MG tablet TAKE ONE TABLET BY MOUTH ONCE DAILY  . apixaban (ELIQUIS) 5 MG TABS tablet Take 1 tablet (5 mg total) by mouth 2 (two) times daily.  . Glycopyrrolate-Formoterol (BEVESPI AEROSPHERE) 9-4.8 MCG/ACT AERO Inhale 2 puffs into the lungs 2 (two) times daily.  Marland Kitchen lisinopril (PRINIVIL,ZESTRIL) 10 MG tablet Take 10 mg by mouth daily. Take 10 mg by mouth daily.  . Multiple Vitamin (MULTIVITAMIN WITH MINERALS) TABS tablet Take 1 tablet by mouth daily.  Marland Kitchen OVER THE COUNTER MEDICATION Take 1,000 mg by mouth daily. [place] Natural Multivitamin Boosts Immunity  Promotes Weight Loss Reduces Stress  . potassium chloride SA (K-DUR,KLOR-CON) 20 MEQ tablet Take 1 tablet (20 mEq total) by mouth every other day. Take every other day with Lasix. (Patient taking differently: Take 20 mEq by mouth every other day. )  . torsemide (DEMADEX) 20 MG tablet Take 10 mg by mouth daily.     Allergies:   No known allergies   Social History   Socioeconomic History  . Marital status: Married    Spouse name: Not  on file  . Number of children: Not on file  . Years of education: Not on file  . Highest education level: Not on file  Occupational History  . Not on file  Social Needs  . Financial resource strain: Not on file  . Food insecurity:    Worry: Not on file    Inability: Not on file  . Transportation needs:    Medical: Not on file    Non-medical: Not on file  Tobacco Use  . Smoking status: Former Smoker    Packs/day: 1.00    Years: 42.00    Pack years: 42.00    Types: Cigarettes    Last attempt to quit: 12/21/1993    Years since quitting: 24.1  . Smokeless tobacco: Never Used  Substance and Sexual Activity  . Alcohol use: No    Alcohol/week: 0.0 oz  . Drug use: No  . Sexual activity: Never  Lifestyle  . Physical activity:    Days per week: Not on file    Minutes per session: Not on file  . Stress: Not on file  Relationships  . Social connections:    Talks on phone: Not on file    Gets together: Not on file    Attends religious service: Not on file    Active member of club or organization: Not on file    Attends meetings of clubs or organizations: Not on file    Relationship status: Not on file  Other Topics Concern  . Not on file  Social History Narrative  . Not on file     Family History:  The patient's family history includes Heart disease in his father, mother, and sister.  ROS:   Please see the history of present illness. All other systems are reviewed and otherwise negative.    PHYSICAL EXAM:   VS:  BP (!) 144/48   Pulse 61   Ht 5\' 11"  (1.803 m)   Wt 147 lb (66.7 kg)   SpO2 98%   BMI 20.50 kg/m   BMI: Body mass index is 20.5 kg/m. GEN: Frail appearing AAM, in no acute distress HEENT: normocephalic, atraumatic Neck: no JVD, carotid bruits, or masses Cardiac: irregularly irregular; no murmurs, rubs, or gallops, no edema  Respiratory:  clear to auscultation bilaterally, normal work of breathing GI: soft, nontender, nondistended, + BS MS: no deformity  or atrophy Skin: warm and dry, no rash Neuro:  Alert and Oriented x 3, Strength and sensation are intact, follows commands Psych: euthymic mood, full affect  Wt Readings from Last 3 Encounters:  01/27/18 147 lb (66.7 kg)  07/31/17 152 lb (68.9 kg)  12/24/16 156 lb 14.4 oz (71.2 kg)      Studies/Labs Reviewed:   EKG:  EKG was ordered today and personally reviewed by me and demonstrates NSR 61bpm incomplete LBBB, nonspecific ST T changes -reviewed with Dr. Rayann Heman who agreed unusual appearance but feels this represents 2nd degree heart block with PACs (not type 2). QTc 440ms in setting of QRS 166ms  Recent Labs: No results found for requested labs within last 8760 hours.   Lipid Panel    Component Value Date/Time   CHOL 122 10/10/2013 0340   TRIG 34 10/10/2013 0340   HDL 69 10/10/2013 0340   CHOLHDL 1.8 10/10/2013 0340   VLDL 7 10/10/2013 0340   LDLCALC 46 10/10/2013 0340    Additional studies/ records that were reviewed today include: Summarized above.    ASSESSMENT & PLAN:   1. Chest pain, atypical - unusual description, not classic for angina. Can last for hours at a time. Wife has tried to get Gardner to go to ER while happening but he habitually declines. I did tell them the next time it happens I think they should go so that we can capture precisely what's going on at the time that it happens, in the form of troponins, EKG changes or rhythm disturbances. He does have history of pulm HTN so I believe it's prudent to obtain f/u echocardiogram to re-evaluate PA pressures given episode of weakness last month and chest discomfort. Furthermore would suggest updating bloodwork to exclude thyroid disease or anemia. He is not a candidate for Lexiscan nuclear stress test given his 2nd degree heart block, and I do not think he would be safe on a treadmill. Cardiac CTA is less ideal as well given his CKD but could be considered if symptoms persist despite eval as  above. The fact that he had  minimal CAD by cath in 2017 makes this less likely to be related to obstruction. I will also plan to review this with Dr. Radford Pax. 2. Atrial flutter s/p ablation - maintaining NSR. There is also question of atrial fibrillation in the past so he is maintained on Eliquis. Will plan to review with MD this week. Update CBC/BMET. Need to follow weight given his weight loss.  3. Chronic combined CHF/NICM - appears euvolemic. Would not press medication titration further at this time given his history of weakness. 4. Mild pulmonary HTN - re-eval as above. 5. Mobitz 1 second degree AV block - chronic, reviewed with Dr. Rayann Heman. He does not feel this is a malignant finding but need to follow clinically. 6. Weight loss - I am quite concerned what this may represent. Start with labs as above, but also suggest f/u with PCP for further eval.  Disposition: F/u with Dr. Betsy Pries team APP in 4 weeks.   Medication Adjustments/Labs and Tests Ordered: Current medicines are reviewed at length with the patient today.  Concerns regarding medicines are outlined above. Medication changes, Labs and Tests ordered today are summarized above and listed in the Patient Instructions accessible in Encounters.   Signed, Robert Pitter, PA-C  01/27/2018 2:51 PM    Robert Gardner [address], [place], [place]  [address] Phone: [phone]; Fax: [phone]

## 2018-01-27 ENCOUNTER — Encounter: Payer: Self-pay | Admitting: Physician Assistant

## 2018-01-27 ENCOUNTER — Ambulatory Visit: Payer: Medicare HMO | Admitting: Physician Assistant

## 2018-01-27 VITALS — BP 144/48 | HR 61 | Ht 71.0 in | Wt 147.0 lb

## 2018-01-27 DIAGNOSIS — R079 Chest pain, unspecified: Secondary | ICD-10-CM

## 2018-01-27 DIAGNOSIS — I272 Pulmonary hypertension, unspecified: Secondary | ICD-10-CM | POA: Diagnosis not present

## 2018-01-27 DIAGNOSIS — I4892 Unspecified atrial flutter: Secondary | ICD-10-CM | POA: Diagnosis not present

## 2018-01-27 DIAGNOSIS — I5042 Chronic combined systolic (congestive) and diastolic (congestive) heart failure: Secondary | ICD-10-CM

## 2018-01-27 DIAGNOSIS — I441 Atrioventricular block, second degree: Secondary | ICD-10-CM | POA: Diagnosis not present

## 2018-01-27 DIAGNOSIS — R634 Abnormal weight loss: Secondary | ICD-10-CM

## 2018-01-27 MED ORDER — POTASSIUM CHLORIDE CRYS ER 20 MEQ PO TBCR: 20.0000 meq | EXTENDED_RELEASE_TABLET | ORAL | 5 refills | Status: AC

## 2018-01-27 MED ORDER — TORSEMIDE 10 MG PO TABS: 10.0000 mg | ORAL_TABLET | Freq: Every day | ORAL | 6 refills | Status: AC

## 2018-01-27 NOTE — Patient Instructions (Addendum)
Medication Instructions:   NONE ORDERED  TODAY   If you need a refill on your cardiac medications before your next appointment, please call your pharmacy.  Labwork: BMET CBC AND TSH TODAY    Testing/Procedures: Your physician has requested that you have an echocardiogram. Echocardiography is a painless test that uses sound waves to create images of your heart. It provides your doctor with information about the size and shape of your heart and how well your heart's chambers and valves are working. This procedure takes approximately one hour. There are no restrictions for this procedure.   Follow-Up:  WITH DR TURNER IN 4 WEEKS OR WITH  APP ON TURNER TEAM ON A DAY TURNER IN OFFICE    Any Other Special Instructions Will Be Listed Below (If Applicable).

## 2018-01-28 LAB — BASIC METABOLIC PANEL
BUN/Creatinine Ratio: 12 (ref 10–24)
BUN: 18 mg/dL (ref 8–27)
CALCIUM: 9.3 mg/dL (ref 8.6–10.2)
CO2: 25 mmol/L (ref 20–29)
CREATININE: 1.55 mg/dL — AB (ref 0.76–1.27)
Chloride: 103 mmol/L (ref 96–106)
GFR calc non Af Amer: 43 mL/min/{1.73_m2} — ABNORMAL LOW (ref >59)
GFR, EST AFRICAN AMERICAN: 50 mL/min/{1.73_m2} — AB (ref >59)
Glucose: 95 mg/dL (ref 65–99)
Potassium: 4.3 mmol/L (ref 3.5–5.2)
Sodium: 138 mmol/L (ref 134–144)

## 2018-01-28 LAB — CBC
HEMATOCRIT: 32.5 % — AB (ref 37.5–51.0)
HEMOGLOBIN: 11.2 g/dL — AB (ref 13.0–17.7)
MCH: 31.7 pg (ref 26.6–33.0)
MCHC: 34.5 g/dL (ref 31.5–35.7)
MCV: 92 fL (ref 79–97)
Platelets: 212 10*3/uL (ref 150–450)
RBC: 3.53 x10E6/uL — AB (ref 4.14–5.80)
RDW: 15.2 % (ref 12.3–15.4)
WBC: 4.2 10*3/uL (ref 3.4–10.8)

## 2018-01-28 LAB — TSH: TSH: 0.303 u[IU]/mL — ABNORMAL LOW (ref 0.450–4.500)

## 2018-01-30 DIAGNOSIS — I509 Heart failure, unspecified: Secondary | ICD-10-CM | POA: Diagnosis not present

## 2018-01-30 DIAGNOSIS — D649 Anemia, unspecified: Secondary | ICD-10-CM | POA: Diagnosis not present

## 2018-01-30 DIAGNOSIS — N183 Chronic kidney disease, stage 3 (moderate): Secondary | ICD-10-CM | POA: Diagnosis not present

## 2018-01-30 DIAGNOSIS — I700 Atherosclerosis of aorta: Secondary | ICD-10-CM | POA: Diagnosis not present

## 2018-01-30 DIAGNOSIS — R1310 Dysphagia, unspecified: Secondary | ICD-10-CM | POA: Diagnosis not present

## 2018-01-30 DIAGNOSIS — I10 Essential (primary) hypertension: Secondary | ICD-10-CM | POA: Diagnosis not present

## 2018-01-30 DIAGNOSIS — I480 Paroxysmal atrial fibrillation: Secondary | ICD-10-CM | POA: Diagnosis not present

## 2018-01-30 DIAGNOSIS — I110 Hypertensive heart disease with heart failure: Secondary | ICD-10-CM | POA: Diagnosis not present

## 2018-01-30 DIAGNOSIS — J439 Emphysema, unspecified: Secondary | ICD-10-CM | POA: Diagnosis not present

## 2018-01-31 ENCOUNTER — Encounter: Payer: Self-pay | Admitting: Internal Medicine

## 2018-01-31 ENCOUNTER — Ambulatory Visit (INDEPENDENT_AMBULATORY_CARE_PROVIDER_SITE_OTHER): Payer: Medicare HMO | Admitting: Internal Medicine

## 2018-01-31 ENCOUNTER — Ambulatory Visit (INDEPENDENT_AMBULATORY_CARE_PROVIDER_SITE_OTHER)
Admission: RE | Admit: 2018-01-31 | Discharge: 2018-01-31 | Disposition: A | Discharge status: Home / Self Care | Payer: Medicare HMO | Source: Ambulatory Visit | Attending: Internal Medicine | Admitting: Internal Medicine

## 2018-01-31 VITALS — BP 138/70 | Ht 71.0 in | Wt 149.2 lb

## 2018-01-31 DIAGNOSIS — J441 Chronic obstructive pulmonary disease with (acute) exacerbation: Secondary | ICD-10-CM

## 2018-01-31 DIAGNOSIS — H43393 Other vitreous opacities, bilateral: Secondary | ICD-10-CM

## 2018-01-31 DIAGNOSIS — R05 Cough: Secondary | ICD-10-CM | POA: Diagnosis not present

## 2018-01-31 DIAGNOSIS — R079 Chest pain, unspecified: Secondary | ICD-10-CM | POA: Diagnosis not present

## 2018-01-31 MED ORDER — PREDNISONE 10 MG PO TABS: ORAL_TABLET | ORAL | 0 refills | Status: AC

## 2018-01-31 MED ORDER — DOXYCYCLINE HYCLATE 100 MG PO TABS: 100.0000 mg | ORAL_TABLET | Freq: Two times a day (BID) | ORAL | 0 refills | Status: AC

## 2018-01-31 NOTE — Progress Notes (Signed)
Subjective:     Patient ID: Robert Gardner, male   DOB: [birth year], 77 y.o.   MRN: [id number]  HPI   former smoker followed for Moderate COPD/Emphysema  Combined diastolic systolic congestive heart failure Atrial  fibrillation on Eliquis  TEST  11/18/15:  Full pulmonary function test 11/18/2015 shows FVC 2.23 L/58%. FEV1 1.82 L/64% with a ratio of 82. There is no broncho-dilator response. DLCO is 13.4/41% and significantly reduced. Overall restriction with low DLCO. 01/11/2016: Walking desaturation test: [name] from 99% to 94%.. Stop at the end of 2 laps due to lightheadedness. This was on room air. He did not complete his third lap.  01/11/2016: CBC shows hemoglobin of 10.3 g percent and this is improved since postoperative blood loss in May 2017.  01/13/2016: High-resolution CT chest personally visualized negative findings. He has bilateral apical emphysema associated with air trapping. He also has evidence of pulmonary hypertension. There is no fibrotic lung disease or nodules. Previously on glass opacities have resolved.  03/29/2016 acute office visit Patient returns for work in visit. Patient was seen in July for COPD. Patient was found to have emphysema on his high resolution CT chest with no evidence of interstitial lung disease. PFT showed restrictive lung disease with an FEV1 at 64% and a ratio of 82. DLCO was decreased at 41%. Walk test w/ no desats in office.  Patient was started on Incruse . Patient says that shortly after starting this inhaler that he noticed that he had enlarged right breast tissue. Patient was seen by his primary care physician and set up for a mammogram.  This was done on 03/01/2016 that showed no concerning masses. Felt to have right gynecomastia. Medication review was done, denies use of Aldactone or finasteride.. Explained to him and his wife that it would be uncommon for increased to have this particular side effect. She is on a calcium channel blocker with  amlodipine, for a long time. Calcium channel blockers can cause gynecomastia. She denies any chest pain, orthopnea, PND, or increased leg swelling. He declines a flu shot.   OV 04/24/2016  Chief Complaint  Patient presents with  . Follow-up    Pt states since seeing TP on 10.5.17 he does not feel back to baseline. Pt c/o increase in SOB, dry cough, right sided chest discomfort. Pt denies f/c/s.     Follow-up moderate COPD with chronic systolic and diastolic heart failure  I personally saw him in July 2017 at which time dyspnea is been deemed due to COPD, anemia and posthospitalization fatigue and deconditioning and chronic systolic and diastolic heart failure. I put him on long-acting anticholinergic manufactured by [employer]. Sometime after that he was diagnosed with gynecomastia so he wanted to change even though this was not the causative agent. He was started on Spiriva one follow-up visit earlier this month which he says is only helping somewhat. He still has significant fatigue and deconditioning. He is only somewhat improved since his hospitalization earlier this year.Review of the labs show that he is still anemic and as of last week his creatinine is suddenly increased 1.8 mg percent. He feels is not nervous situation where he can work anymore. He is not having any active bleeding   OV 08/20/2016  Chief Complaint  Patient presents with  . Follow-up    Pt states he feels he is doing well - states he has noticed an improvement since taking the bevespi. Pt c/o nonprod cough and occ midsternal chest pain.  Follow-up moderate COPD with chronic systolic and diastolic heart failure  Last seen October 2017. He presents with his wife [name] . Overall he is feeling stable. He likes [employer] dual inhaler long-acting beta agonist glycopyrrolate and long-acting inhaled steroid budesonide (BEVESPII). He is not in COPD exacerbation. His fatigue is improved. He does have some  occasional nonspecific midsternal chest pain. He has an upcoming appointment with her. He is concerned about his right breast lump. In September 2017 he had a mammogram and apparently he was reassured. However they're extremely concerned about this and is requesting a referral.   OV 11/20/2016  Chief Complaint  Patient presents with  . Follow-up    Pt states his breathing is doing well. Pt states his SOB is at baseline. Pt denies cough and CP/tightness.    Follow-up moderate COPD in the setting of chronic systolic and diastolic heart failure.  Last seen February 2018. At that point in time he is concerned about a breast lump. In April 2018 he did have biopsy of this on the right side. I visualized and review the report. It is not malignancy. It is gynecomastia. Terms of his COPD is stable. He really likes the inhaler BEVESPI . He takes it 2 puff 2 times daily. He uses albuterol for rescue but has not needed it since last visit but he always carries it in the pocket. He has not priced the inhaler BEVESPI as yet   OV 07/31/2017  Chief Complaint  Patient presents with  . Follow-up    Pt states he has been doing okay. Pt's wife states pt was having some CP yesterday morning that lasted about 2 hours and also has complaints of cough and SOB.    FU Mod CPD in setting of chf systolic/diast   77 year old male with moderate COPD and chronic systolic diastolic heart failure.  He presents for routine follow-up.  No interim exacerbations.  Feels well.  Wife feels that he is slowly losing weight because of poor appetite.  He plans to increase his protein intake.  The only new issue is that he is describing him some kind of a funny feeling in his chest for the last few months.  He feels like it is an electric current running through different parts of his chest randomly and lasts a few seconds to a few minutes and then subsides.  It is not exertional chest pain.  There is occasional twitching of the muscle  fibers in his upper extremities.  Otherwise no fever no hemoptysis or sputum production.  He is ineligible for lung cancer low-dose CT scanning program because it has been more than 15 years since he quit smoking.  But higher CT in 2017 did not show any mass    OV 01/31/2018  Chief Complaint  Patient presents with  . Acute Visit    Increased coughing and SOB within the last month. Patient feels like he is choking while coughing.     [name] , 77 y.o. , with dob 07/08/1940 and male ,Not Hispanic or Latino from [address] [place] [address] - presents to lung  clinic for  Acute visit. E is here with his wife. He tells me that he is here on request of his primary care physician. For the last 1 month he's had increased cough with chest tightness and wheezing and shortness of breathassociated with new onset of gray white sputum. He has never had baseline sputum. He feels more fatigued than usual but is  no chest pain. He is on maintenance inhaler BEVSPI for his COPD. He is wondering whether that should be a change on this. Last chest x-ray February 2019 with atelectasis and very small effusions. No masses reported on the chest x-ray. No edema. He feels fatigued with his wife had reported on a wheelchair and bring him in an assisted today.  He is also has another new issue which is the pruple floaters in bilateral eyes for the last few days. He has not reported this to the primary care physician. There is no neurologic deficit no syncopeno one-sided weakness no dizziness, no orthostasis, no loss of vision or seizures  eview of the past medical history indicates he had a cardiology visit in August 2019. He is on ACE inhibitor for chronic systolic heart failure. He says he is been on it for a long time and it is never made him cough      has a past medical history of Arthritis, Atrial flutter ([place]), Bell palsy (2013), Cancer ([place]), Chronic combined systolic and diastolic CHF (congestive  heart failure) ([place]), CKD (chronic kidney disease), stage III ([place]), Combined systolic and diastolic heart failure ([place]), Erectile dysfunction, Hypertension, Mitral regurgitation, Mobitz (type) I (Wenckebach's) atrioventricular block, NICM (nonischemic cardiomyopathy) ([place]), Normocytic anemia, Second degree AV block, Mobitz type I (09/08/2015), and Thyroid enlargement.   reports that he quit smoking about 24 years ago. His smoking use included cigarettes. He has a 42.00 pack-year smoking history. He has never used smokeless tobacco.  Past Surgical History:  Procedure Laterality Date  . ABLATION  11-11-2013   CTI ablation by Dr Lovena Le  . ATRIAL FLUTTER ABLATION N/A 11/11/2013   Procedure: ATRIAL FLUTTER ABLATION;  Surgeon: Evans Lance, MD;  Location: [facility] CATH LAB;  Service: Cardiovascular;  Laterality: N/A;  . BREAST LUMPECTOMY Right 09/26/2016   Procedure: RIGHT BREAST LUMPECTOMY;  Surgeon: Coralie Keens, MD;  Location: [facility];  Service: General;  Laterality: Right;  . CARDIAC CATHETERIZATION N/A 10/05/2015   Procedure: Left Heart Cath and Coronary Angiography;  Surgeon: Troy Sine, MD;  Location: [facility] CV LAB;  Service: Cardiovascular;  Laterality: N/A;  . CYST REMOVAL TRUNK     like a cyst  . Rectal tear     repair  . Resection of melanoma Right    Leg  . TOTAL KNEE ARTHROPLASTY Right 10/31/2015   Procedure: RIGHT TOTAL KNEE ARTHROPLASTY;  Surgeon: Frederik Pear, MD;  Location: [facility];  Service: Orthopedics;  Laterality: Right;    Allergies  Allergen Reactions  . No Known Allergies     Immunization History  Administered Date(s) Administered  . Pneumococcal Polysaccharide-23 01/11/2015    Family History  Problem Relation Age of Onset  . Heart disease Mother        unclear details  . Heart disease Father        unclear details  . Heart disease Sister        unclear details     Current Outpatient Medications:  .  acetaminophen (TYLENOL) 500 MG tablet, Take 1,000 mg by  mouth every 6 (six) hours as needed for mild pain., Disp: , Rfl:  .  albuterol (PROVENTIL HFA;VENTOLIN HFA) 108 (90 Base) MCG/ACT inhaler, Inhale 2 puffs into the lungs every 6 (six) hours as needed for wheezing or shortness of breath., Disp: 3 Inhaler, Rfl: 3 .  amLODipine (NORVASC) 5 MG tablet, TAKE ONE TABLET BY MOUTH ONCE DAILY, Disp: 180 tablet, Rfl: 3 .  apixaban (ELIQUIS) 5 MG  TABS tablet, Take 1 tablet (5 mg total) by mouth 2 (two) times daily., Disp: 180 tablet, Rfl: 3 .  Glycopyrrolate-Formoterol (BEVESPI AEROSPHERE) 9-4.8 MCG/ACT AERO, Inhale 2 puffs into the lungs 2 (two) times daily., Disp: 3 Inhaler, Rfl: 3 .  lisinopril (PRINIVIL,ZESTRIL) 10 MG tablet, Take 10 mg by mouth daily., Disp: , Rfl:  .  Multiple Vitamin (MULTIVITAMIN WITH MINERALS) TABS tablet, Take 1 tablet by mouth daily., Disp: , Rfl:  .  OVER THE COUNTER MEDICATION, Take 1,000 mg by mouth daily. [place] Natural Multivitamin Boosts Immunity  Promotes Weight Loss Reduces Stress, Disp: , Rfl:  .  potassium chloride SA (K-DUR,KLOR-CON) 20 MEQ tablet, Take 1 tablet (20 mEq total) by mouth every other day., Disp: 30 tablet, Rfl: 5 .  torsemide (DEMADEX) 10 MG tablet, Take 1 tablet (10 mg total) by mouth daily., Disp: 30 tablet, Rfl: 6   Review of Systems     Objective:   Physical Exam  Constitutional: He is oriented to person, place, and time. He appears well-developed and well-nourished. No distress.  Sitting in wheel chair Stable looking But more tired than usual  HENT:  Head: Normocephalic and atraumatic.  Right Ear: External ear normal.  Left Ear: External ear normal.  Mouth/Throat: Oropharynx is clear and moist. No oropharyngeal exudate.  Eyes: Pupils are equal, round, and reactive to light. Conjunctivae and EOM are normal. Right eye exhibits no discharge. Left eye exhibits no discharge. No scleral icterus.  Neck: Normal range of motion. Neck supple. No JVD present. No tracheal deviation present. No thyromegaly  present.  Cardiovascular: Normal rate, regular rhythm and intact distal pulses. Exam reveals no gallop and no friction rub.  No murmur heard. Pulmonary/Chest: Effort normal and breath sounds normal. No respiratory distress. He has no wheezes. He has no rales. He exhibits no tenderness.  Abdominal: Soft. Bowel sounds are normal. He exhibits no distension and no mass. There is no tenderness. There is no rebound and no guarding.  Musculoskeletal: Normal range of motion. He exhibits no edema or tenderness.  Lymphadenopathy:    He has no cervical adenopathy.  Neurological: He is alert and oriented to person, place, and time. He has normal reflexes. No cranial nerve deficit. Coordination normal.  Skin: Skin is warm and dry. No rash noted. He is not diaphoretic. No erythema. No pallor.  Psychiatric: He has a normal mood and affect. His behavior is normal. Judgment and thought content normal.  Nursing note and vitals reviewed.  Vitals:   01/31/18 0941  BP: 138/70  Weight: 149 lb 3.2 oz (67.7 kg)  Height: 5\' 11"  (1.803 m)    Estimated body mass index is 20.81 kg/m as calculated from the following:   Height as of this encounter: 5\' 11"  (1.803 m).   Weight as of this encounter: 149 lb 3.2 oz (67.7 kg).     Assessment:       ICD-10-CM   1. COPD with acute exacerbation ([place]) J44.1   2. Floaters in visual field, bilateral H43.393        Plan:       COPD with acute exacerbation ([place]) - I think this is what you have  - Lisinopril could be making the situation worse  plan -  Take prednisone 40 mg daily x 2 days, then 20mg  daily x 2 days, then 10mg  daily x 2 days, then 5mg  daily x 2 days and stop - Take doxycycline 100mg  po twice daily x 5 days; take after meals and avoid  sunlight - for now continue lisinopril - no change to bevespi scheduled as of now but depending on course could change  - albuterol as needd to continue - CXR 2 view 01/31/2018 (last in feb 2019 showed some fluid) - go  to ER if worse/not better  Floaters in visual field, bilateral - new issue  - talk to PCP Antony Contras, MD   [place] In 6 weeks do PFT test Return to see APP in 6 weeks ER if worse   Dr. Brand Males, M.D., [facility].C.P Pulmonary and Critical Care Medicine Staff Physician, [place] Director - Interstitial Lung Disease  Program  Pulmonary [place] at [place], [place], [address]  Pager: [phone], If no answer or between  15:00h - 7:00h: call [phone] Telephone: [phone]

## 2018-01-31 NOTE — Patient Instructions (Signed)
ICD-10-CM   1. COPD with acute exacerbation ([place]) J44.1   2. Floaters in visual field, bilateral H43.393      COPD with acute exacerbation ([place]) - I think this is what you have  - Lisinopril could be making the situation worse  plan -  Take prednisone 40 mg daily x 2 days, then 20mg  daily x 2 days, then 10mg  daily x 2 days, then 5mg  daily x 2 days and stop - Take doxycycline 100mg  po twice daily x 5 days; take after meals and avoid sunlight - for now continue lisinopril - no change to bevespi scheduled as of now but depending on course could change  - albuterol as needd to continue - CXR 2 view 01/31/2018 (last in feb 2019 showed some fluid) - go to ER if worse/not better  Floaters in visual field, bilateral - new issue  - talk to PCP Antony Contras, MD   [place] In 6 weeks do PFT test Return to see APP in 6 weeks ER if worse

## 2018-02-04 ENCOUNTER — Ambulatory Visit (HOSPITAL_COMMUNITY): Payer: Medicare HMO | Attending: Cardiology

## 2018-02-04 ENCOUNTER — Other Ambulatory Visit: Payer: Self-pay

## 2018-02-04 DIAGNOSIS — I088 Other rheumatic multiple valve diseases: Secondary | ICD-10-CM | POA: Diagnosis not present

## 2018-02-04 DIAGNOSIS — I441 Atrioventricular block, second degree: Secondary | ICD-10-CM | POA: Diagnosis not present

## 2018-02-04 DIAGNOSIS — I509 Heart failure, unspecified: Secondary | ICD-10-CM | POA: Diagnosis not present

## 2018-02-04 DIAGNOSIS — I272 Pulmonary hypertension, unspecified: Secondary | ICD-10-CM | POA: Diagnosis not present

## 2018-02-04 DIAGNOSIS — I110 Hypertensive heart disease with heart failure: Secondary | ICD-10-CM | POA: Diagnosis not present

## 2018-02-04 DIAGNOSIS — R079 Chest pain, unspecified: Secondary | ICD-10-CM | POA: Diagnosis not present

## 2018-02-05 ENCOUNTER — Other Ambulatory Visit: Payer: Self-pay

## 2018-02-05 ENCOUNTER — Telehealth: Payer: Self-pay

## 2018-02-05 DIAGNOSIS — R0602 Shortness of breath: Secondary | ICD-10-CM

## 2018-02-05 DIAGNOSIS — Z01812 Encounter for preprocedural laboratory examination: Secondary | ICD-10-CM

## 2018-02-05 DIAGNOSIS — I5042 Chronic combined systolic (congestive) and diastolic (congestive) heart failure: Secondary | ICD-10-CM

## 2018-02-05 NOTE — Telephone Encounter (Signed)
-----   Message from Sueanne Margarita, MD sent at 02/04/2018  5:01 PM EDT ----- Please let patient know that echo showed moderately thickened heart muscle with low normal LVF, mild MR and moderate pulmonary HTN - compared to prior echo pulmonary HTN has worsened.  Please get VQ scan to rule out PE and PFTs. Repeat echo in 1 year

## 2018-02-05 NOTE — Telephone Encounter (Signed)
Notes recorded by Frederik Schmidt, RN on 02/05/2018 at 8:37 AM EDT Spoke to patient's wife and informed her of Echo results/recommendations. I informed her that we will be calling them to schedule a VQ Scan at the hospital. ------

## 2018-02-06 ENCOUNTER — Encounter: Payer: Self-pay | Admitting: Cardiology

## 2018-02-10 ENCOUNTER — Encounter (HOSPITAL_COMMUNITY)
Admission: RE | Admit: 2018-02-10 | Discharge: 2018-02-10 | Disposition: A | Discharge status: Home / Self Care | Payer: Medicare HMO | Source: Ambulatory Visit | Attending: Cardiology | Admitting: Cardiology

## 2018-02-10 ENCOUNTER — Ambulatory Visit (HOSPITAL_COMMUNITY)
Admission: RE | Admit: 2018-02-10 | Discharge: 2018-02-10 | Disposition: A | Discharge status: Home / Self Care | Payer: Medicare HMO | Source: Ambulatory Visit | Attending: Cardiology | Admitting: Cardiology

## 2018-02-10 DIAGNOSIS — I5042 Chronic combined systolic (congestive) and diastolic (congestive) heart failure: Secondary | ICD-10-CM

## 2018-02-10 DIAGNOSIS — Z01812 Encounter for preprocedural laboratory examination: Secondary | ICD-10-CM

## 2018-02-10 DIAGNOSIS — R0602 Shortness of breath: Secondary | ICD-10-CM

## 2018-02-10 DIAGNOSIS — Q791 Other congenital malformations of diaphragm: Secondary | ICD-10-CM | POA: Diagnosis not present

## 2018-02-10 DIAGNOSIS — R918 Other nonspecific abnormal finding of lung field: Secondary | ICD-10-CM | POA: Diagnosis not present

## 2018-02-10 DIAGNOSIS — R05 Cough: Secondary | ICD-10-CM | POA: Diagnosis not present

## 2018-02-10 MED ORDER — TECHNETIUM TO 99M ALBUMIN AGGREGATED
4.3000 | Freq: Once | INTRAVENOUS | Status: AC | PRN
  Administered 2018-02-10: 4.3 via INTRAVENOUS

## 2018-02-10 MED ORDER — TECHNETIUM TC 99M DIETHYLENETRIAME-PENTAACETIC ACID
32.8000 | Freq: Once | INTRAVENOUS | Status: AC | PRN
  Administered 2018-02-10: 32.8 via RESPIRATORY_TRACT

## 2018-02-11 ENCOUNTER — Telehealth: Payer: Self-pay | Admitting: Internal Medicine

## 2018-02-11 DIAGNOSIS — R634 Abnormal weight loss: Secondary | ICD-10-CM | POA: Diagnosis not present

## 2018-02-11 DIAGNOSIS — Z1211 Encounter for screening for malignant neoplasm of colon: Secondary | ICD-10-CM | POA: Diagnosis not present

## 2018-02-11 DIAGNOSIS — J449 Chronic obstructive pulmonary disease, unspecified: Secondary | ICD-10-CM | POA: Diagnosis not present

## 2018-02-11 DIAGNOSIS — R9389 Abnormal findings on diagnostic imaging of other specified body structures: Secondary | ICD-10-CM | POA: Diagnosis not present

## 2018-02-11 NOTE — Telephone Encounter (Signed)
Spoke with Dr. Dema Severin. She stated that the patient stated that his breathing was back at baseline. Patient was not in any distress during visit today. She just wanted Dr. Chase Caller to take a look at patient's CXR since it has changed slightly since the first one that was done on 01/31/18.   MR, please advise. CXR is in patient's chart. Thanks!

## 2018-02-13 DIAGNOSIS — R1310 Dysphagia, unspecified: Secondary | ICD-10-CM | POA: Diagnosis not present

## 2018-02-13 DIAGNOSIS — D649 Anemia, unspecified: Secondary | ICD-10-CM | POA: Diagnosis not present

## 2018-02-13 DIAGNOSIS — Z8679 Personal history of other diseases of the circulatory system: Secondary | ICD-10-CM | POA: Diagnosis not present

## 2018-02-13 NOTE — Telephone Encounter (Signed)
Called and spoke to Dr. Dema Severin and informed her of the recs per MR. Dr. Dema Severin verbalized understanding and states she will have pt do CXR in 4-6 weeks and if there are concerns with results she will let us know. Nothing further needed at this time.

## 2018-02-13 NOTE — Telephone Encounter (Signed)
Thanks Please let Dr Dema Severin know to repeat a cxr 2 view in 4-6 weeks even if he is felling fine. We can do it as well with an OV if you are having trouble reachign Dr Dema Severin

## 2018-02-17 ENCOUNTER — Other Ambulatory Visit: Payer: Self-pay | Admitting: Gastroenterology

## 2018-02-17 DIAGNOSIS — R1310 Dysphagia, unspecified: Secondary | ICD-10-CM

## 2018-02-17 DIAGNOSIS — R1319 Other dysphagia: Secondary | ICD-10-CM

## 2018-02-18 ENCOUNTER — Ambulatory Visit
Admission: RE | Admit: 2018-02-18 | Discharge: 2018-02-18 | Disposition: A | Discharge status: Home / Self Care | Payer: Medicare HMO | Source: Ambulatory Visit | Attending: Gastroenterology | Admitting: Gastroenterology

## 2018-02-18 ENCOUNTER — Other Ambulatory Visit: Payer: Self-pay | Admitting: Cardiology

## 2018-02-18 DIAGNOSIS — R1319 Other dysphagia: Secondary | ICD-10-CM

## 2018-02-18 DIAGNOSIS — R1310 Dysphagia, unspecified: Secondary | ICD-10-CM

## 2018-02-18 MED ORDER — APIXABAN 5 MG PO TABS: 5.0000 mg | ORAL_TABLET | Freq: Two times a day (BID) | ORAL | 0 refills | Status: AC

## 2018-02-18 NOTE — Telephone Encounter (Signed)
°*  STAT* If patient is at the pharmacy, call can be transferred to refill team.   1. Which medications need to be refilled? (please list name of each medication and dose if known) Eliquis-l te  2. Which pharmacy/location (including street and city if local pharmacy) is medication to be sent to? [facility] [phone]- Please have 30days supply be sent to [facility] on [address]  3. Do they need a 30 day or 90 day supply? 180 and refills and 60 for [facility] -call today please-pt is  Completely out of his Eliquis-need one for this evening

## 2018-02-18 NOTE — Telephone Encounter (Signed)
Eliquis 5mg  refill request received; pt is 77 yrs old, wt-67.7kg, Crea-1.55 on 01/27/18, last seen by Melina Copa on 01/27/18; will send refill to requested pharmacy-pt requested a refill be sent locally to [facility] and [facility] & both have been sent.

## 2018-02-20 DIAGNOSIS — R404 Transient alteration of awareness: Secondary | ICD-10-CM | POA: Diagnosis not present

## 2018-02-20 DIAGNOSIS — R4020 Unspecified coma: Secondary | ICD-10-CM | POA: Diagnosis not present

## 2018-02-20 DIAGNOSIS — I499 Cardiac arrhythmia, unspecified: Secondary | ICD-10-CM | POA: Diagnosis not present

## 2018-02-23 DIAGNOSIS — 419620001 Death: Secondary | SNOMED CT | POA: Diagnosis not present

## 2018-02-23 DEATH — deceased

## 2018-03-03 ENCOUNTER — Ambulatory Visit: Payer: Medicare HMO | Admitting: Cardiology

## 2018-03-03 DIAGNOSIS — R0989 Other specified symptoms and signs involving the circulatory and respiratory systems: Secondary | ICD-10-CM

## 2018-03-04 ENCOUNTER — Encounter: Payer: Self-pay | Admitting: Cardiology

## 2018-03-14 ENCOUNTER — Ambulatory Visit: Payer: Medicare HMO | Admitting: Primary Care

## 2019-02-02 ENCOUNTER — Other Ambulatory Visit (HOSPITAL_COMMUNITY): Payer: Medicare HMO
